# Patient Record
Sex: Male | Born: 1935 | Race: White | Hispanic: No | Smoking: Never smoker
Health system: Southern US, Community
[De-identification: ages and names within clinical notes are randomized; demographics above are authoritative.]

## PROBLEM LIST (undated history)

## (undated) DIAGNOSIS — F329 Major depressive disorder, single episode, unspecified: Secondary | ICD-10-CM

## (undated) DIAGNOSIS — G20 Parkinson's disease: Secondary | ICD-10-CM

## (undated) DIAGNOSIS — G2581 Restless legs syndrome: Secondary | ICD-10-CM

## (undated) DIAGNOSIS — I10 Essential (primary) hypertension: Secondary | ICD-10-CM

## (undated) DIAGNOSIS — F419 Anxiety disorder, unspecified: Secondary | ICD-10-CM

## (undated) DIAGNOSIS — E039 Hypothyroidism, unspecified: Secondary | ICD-10-CM

## (undated) DIAGNOSIS — G20A1 Parkinson's disease without dyskinesia, without mention of fluctuations: Secondary | ICD-10-CM

## (undated) DIAGNOSIS — N200 Calculus of kidney: Secondary | ICD-10-CM

## (undated) DIAGNOSIS — I2510 Atherosclerotic heart disease of native coronary artery without angina pectoris: Secondary | ICD-10-CM

## (undated) DIAGNOSIS — F32A Depression, unspecified: Secondary | ICD-10-CM

## (undated) HISTORY — PX: APPENDECTOMY: SHX54

## (undated) HISTORY — PX: CARDIAC CATHETERIZATION: SHX172

## (undated) HISTORY — PX: CORONARY ARTERY BYPASS GRAFT: SHX141

---

## 2007-06-20 ENCOUNTER — Other Ambulatory Visit: Payer: Self-pay

## 2007-06-20 ENCOUNTER — Inpatient Hospital Stay: Payer: Self-pay | Admitting: Internal Medicine

## 2007-06-22 ENCOUNTER — Other Ambulatory Visit: Payer: Self-pay

## 2007-07-10 ENCOUNTER — Inpatient Hospital Stay: Payer: Self-pay | Admitting: Specialist

## 2007-07-10 ENCOUNTER — Other Ambulatory Visit: Payer: Self-pay

## 2007-07-28 ENCOUNTER — Observation Stay: Payer: Self-pay | Admitting: Specialist

## 2007-07-28 ENCOUNTER — Other Ambulatory Visit: Payer: Self-pay

## 2008-06-23 ENCOUNTER — Ambulatory Visit: Payer: Self-pay | Admitting: Surgery

## 2011-03-07 ENCOUNTER — Emergency Department: Payer: Self-pay | Admitting: Emergency Medicine

## 2011-09-28 ENCOUNTER — Emergency Department: Payer: Self-pay | Admitting: Emergency Medicine

## 2011-09-28 LAB — APTT: Activated PTT: 28.3 secs (ref 23.6–35.9)

## 2011-09-28 LAB — CBC
HCT: 44.4 % (ref 40.0–52.0)
RBC: 4.64 10*6/uL (ref 4.40–5.90)
RDW: 11.9 % (ref 11.5–14.5)

## 2011-09-28 LAB — URINALYSIS, COMPLETE
Bilirubin,UR: NEGATIVE
Blood: NEGATIVE
Glucose,UR: NEGATIVE mg/dL (ref 0–75)
Leukocyte Esterase: NEGATIVE
Nitrite: NEGATIVE
Ph: 6 (ref 4.5–8.0)
Specific Gravity: 1.023 (ref 1.003–1.030)
WBC UR: 1 /HPF (ref 0–5)

## 2011-09-28 LAB — COMPREHENSIVE METABOLIC PANEL
Alkaline Phosphatase: 57 U/L (ref 50–136)
BUN: 15 mg/dL (ref 7–18)
Calcium, Total: 9.1 mg/dL (ref 8.5–10.1)
Co2: 26 mmol/L (ref 21–32)
Creatinine: 1.06 mg/dL (ref 0.60–1.30)
EGFR (Non-African Amer.): >60
Glucose: 84 mg/dL (ref 65–99)
Osmolality: 276 (ref 275–301)
Potassium: 4 mmol/L (ref 3.5–5.1)
SGOT(AST): 35 U/L (ref 15–37)
Sodium: 138 mmol/L (ref 136–145)
Total Protein: 7.9 g/dL (ref 6.4–8.2)

## 2011-09-28 LAB — TSH: Thyroid Stimulating Horm: 2.5 u[IU]/mL

## 2011-09-28 LAB — PROTIME-INR: INR: 1

## 2014-05-02 ENCOUNTER — Emergency Department: Payer: Self-pay | Admitting: Student

## 2014-10-18 ENCOUNTER — Emergency Department
Admit: 2014-10-18 | Disposition: A | Discharge status: Home / Self Care | Payer: Self-pay | Admitting: Emergency Medicine

## 2014-10-18 LAB — URINALYSIS, COMPLETE
BLOOD: NEGATIVE
Bacteria: NONE SEEN
Bilirubin,UR: NEGATIVE
Glucose,UR: NEGATIVE mg/dL (ref 0–75)
NITRITE: NEGATIVE
PH: 5 (ref 4.5–8.0)
Protein: 30
Specific Gravity: 1.032 (ref 1.003–1.030)
Squamous Epithelial: NONE SEEN

## 2014-10-18 LAB — CBC
HCT: 40.4 % (ref 40.0–52.0)
HGB: 14 g/dL (ref 13.0–18.0)
MCH: 32.6 pg (ref 26.0–34.0)
MCHC: 34.7 g/dL (ref 32.0–36.0)
MCV: 94 fL (ref 80–100)
PLATELETS: 140 10*3/uL — AB (ref 150–440)
RBC: 4.3 10*6/uL — ABNORMAL LOW (ref 4.40–5.90)
RDW: 13.1 % (ref 11.5–14.5)
WBC: 9 10*3/uL (ref 3.8–10.6)

## 2014-10-18 LAB — COMPREHENSIVE METABOLIC PANEL
ALK PHOS: 58 U/L
AST: 24 U/L
Albumin: 4.4 g/dL
Anion Gap: 8 (ref 7–16)
BUN: 46 mg/dL — AB
Bilirubin,Total: 0.9 mg/dL
CALCIUM: 9.1 mg/dL
CO2: 24 mmol/L
Chloride: 108 mmol/L
Creatinine: 1.62 mg/dL — ABNORMAL HIGH
EGFR (African American): 44 — ABNORMAL LOW
EGFR (Non-African Amer.): 38 — ABNORMAL LOW
GLUCOSE: 156 mg/dL — AB
Potassium: 3.8 mmol/L
SODIUM: 140 mmol/L
Total Protein: 6.9 g/dL

## 2014-10-18 LAB — TROPONIN I: Troponin-I: <0.03 ng/mL

## 2014-10-19 LAB — URINE CULTURE

## 2015-01-16 ENCOUNTER — Other Ambulatory Visit: Payer: Self-pay

## 2015-01-16 ENCOUNTER — Encounter: Payer: Self-pay | Admitting: Emergency Medicine

## 2015-01-16 ENCOUNTER — Emergency Department: Payer: Medicare Other

## 2015-01-16 ENCOUNTER — Emergency Department
Admission: EM | Admit: 2015-01-16 | Discharge: 2015-01-16 | Disposition: A | Discharge status: Home / Self Care | Payer: Medicare Other | Attending: Emergency Medicine | Admitting: Emergency Medicine

## 2015-01-16 DIAGNOSIS — I10 Essential (primary) hypertension: Secondary | ICD-10-CM | POA: Insufficient documentation

## 2015-01-16 DIAGNOSIS — Z87891 Personal history of nicotine dependence: Secondary | ICD-10-CM | POA: Insufficient documentation

## 2015-01-16 DIAGNOSIS — R109 Unspecified abdominal pain: Secondary | ICD-10-CM | POA: Diagnosis present

## 2015-01-16 HISTORY — DX: Calculus of kidney: N20.0

## 2015-01-16 HISTORY — DX: Major depressive disorder, single episode, unspecified: F32.9

## 2015-01-16 HISTORY — DX: Essential (primary) hypertension: I10

## 2015-01-16 HISTORY — DX: Depression, unspecified: F32.A

## 2015-01-16 HISTORY — DX: Anxiety disorder, unspecified: F41.9

## 2015-01-16 LAB — URINALYSIS COMPLETE WITH MICROSCOPIC (ARMC ONLY)
Bacteria, UA: NONE SEEN
Bilirubin Urine: NEGATIVE
GLUCOSE, UA: NEGATIVE mg/dL
Hgb urine dipstick: NEGATIVE
KETONES UR: NEGATIVE mg/dL
LEUKOCYTES UA: NEGATIVE
Nitrite: NEGATIVE
PROTEIN: NEGATIVE mg/dL
RBC / HPF: NONE SEEN RBC/hpf (ref 0–5)
Specific Gravity, Urine: 1.003 — ABNORMAL LOW (ref 1.005–1.030)
Squamous Epithelial / LPF: NONE SEEN
WBC, UA: NONE SEEN WBC/hpf (ref 0–5)
pH: 7 (ref 5.0–8.0)

## 2015-01-16 LAB — CBC WITH DIFFERENTIAL/PLATELET
BASOS ABS: 0.1 10*3/uL (ref 0–0.1)
Basophils Relative: 2 %
Eosinophils Absolute: 0.2 10*3/uL (ref 0–0.7)
Eosinophils Relative: 3 %
HEMATOCRIT: 36.5 % — AB (ref 40.0–52.0)
HEMOGLOBIN: 12.5 g/dL — AB (ref 13.0–18.0)
LYMPHS ABS: 0.9 10*3/uL — AB (ref 1.0–3.6)
Lymphocytes Relative: 15 %
MCH: 32 pg (ref 26.0–34.0)
MCHC: 34.2 g/dL (ref 32.0–36.0)
MCV: 93.7 fL (ref 80.0–100.0)
Monocytes Absolute: 0.2 10*3/uL (ref 0.2–1.0)
Monocytes Relative: 4 %
NEUTROS ABS: 4.4 10*3/uL (ref 1.4–6.5)
Neutrophils Relative %: 76 %
Platelets: 110 10*3/uL — ABNORMAL LOW (ref 150–440)
RBC: 3.89 MIL/uL — ABNORMAL LOW (ref 4.40–5.90)
RDW: 13.2 % (ref 11.5–14.5)
WBC: 5.8 10*3/uL (ref 3.8–10.6)

## 2015-01-16 LAB — BASIC METABOLIC PANEL
Anion gap: 6 (ref 5–15)
BUN: 24 mg/dL — AB (ref 6–20)
CO2: 28 mmol/L (ref 22–32)
CREATININE: 1.06 mg/dL (ref 0.61–1.24)
Calcium: 8.4 mg/dL — ABNORMAL LOW (ref 8.9–10.3)
Chloride: 104 mmol/L (ref 101–111)
GFR calc non Af Amer: >60 mL/min (ref >60)
Glucose, Bld: 114 mg/dL — ABNORMAL HIGH (ref 65–99)
POTASSIUM: 4.4 mmol/L (ref 3.5–5.1)
SODIUM: 138 mmol/L (ref 135–145)

## 2015-01-16 LAB — HEPATIC FUNCTION PANEL
ALT: 5 U/L — AB (ref 17–63)
AST: 15 U/L (ref 15–41)
Albumin: 3.8 g/dL (ref 3.5–5.0)
Alkaline Phosphatase: 44 U/L (ref 38–126)
BILIRUBIN DIRECT: 0.2 mg/dL (ref 0.1–0.5)
BILIRUBIN TOTAL: 0.9 mg/dL (ref 0.3–1.2)
Indirect Bilirubin: 0.7 mg/dL (ref 0.3–0.9)
Total Protein: 5.8 g/dL — ABNORMAL LOW (ref 6.5–8.1)

## 2015-01-16 LAB — LIPASE, BLOOD: LIPASE: 22 U/L (ref 22–51)

## 2015-01-16 MED ORDER — SODIUM CHLORIDE 0.9 % IV BOLUS (SEPSIS)
500.0000 mL | INTRAVENOUS | Status: AC
  Administered 2015-01-16: 500 mL via INTRAVENOUS

## 2015-01-16 MED ORDER — IOHEXOL 240 MG/ML SOLN
25.0000 mL | Freq: Once | INTRAMUSCULAR | Status: AC | PRN
  Administered 2015-01-16: 25 mL via ORAL

## 2015-01-16 MED ORDER — IOHEXOL 300 MG/ML  SOLN
100.0000 mL | Freq: Once | INTRAMUSCULAR | Status: AC | PRN
  Administered 2015-01-16: 100 mL via INTRAVENOUS

## 2015-01-16 NOTE — Discharge Instructions (Signed)
You have been seen in the Emergency Department (ED) for flank pain.  Your evaluation did not identify a clear cause of your symptoms but was generally reassuring; there is no evidence that you have a urinary tract infection and your CT scan was normal.  You may have some muscle strain which would explain why you notice the pain more when your straining to get up out of bed.  Please follow up as instructed above regarding todays emergent visit and the symptoms that are bothering you.  Return to the ED if your abdominal pain worsens or fails to improve, you develop bloody vomiting, bloody diarrhea, you are unable to tolerate fluids due to vomiting, fever greater than 101, or other symptoms that concern you.   Flank Pain Flank pain refers to pain that is located on the side of the body between the upper abdomen and the back. The pain may occur over a short period of time (acute) or may be long-term or reoccurring (chronic). It may be mild or severe. Flank pain can be caused by many things. CAUSES  Some of the more common causes of flank pain include:  Muscle strains.   Muscle spasms.   A disease of your spine (vertebral disk disease).   A lung infection (pneumonia).   Fluid around your lungs (pulmonary edema).   A kidney infection.   Kidney stones.   A very painful skin rash caused by the chickenpox virus (shingles).   Gallbladder disease.  HOME CARE INSTRUCTIONS  Home care will depend on the cause of your pain. In general,  Rest as directed by your caregiver.  Drink enough fluids to keep your urine clear or pale yellow.  Only take over-the-counter or prescription medicines as directed by your caregiver. Some medicines may help relieve the pain.  Tell your caregiver about any changes in your pain.  Follow up with your caregiver as directed. SEEK IMMEDIATE MEDICAL CARE IF:   Your pain is not controlled with medicine.   You have new or worsening symptoms.  Your  pain increases.   You have abdominal pain.   You have shortness of breath.   You have persistent nausea or vomiting.   You have swelling in your abdomen.   You feel faint or pass out.   You have blood in your urine.  You have a fever or persistent symptoms for more than 2-3 days.  You have a fever and your symptoms suddenly get worse. MAKE SURE YOU:   Understand these instructions.  Will watch your condition.  Will get help right away if you are not doing well or get worse. Document Released: 08/04/2005 Document Revised: 03/07/2012 Document Reviewed: 01/26/2012 [facility] Patient Information 2015 [place], [place]. This information is not intended to replace advice given to you by your health care provider. Make sure you discuss any questions you have with your health care provider.

## 2015-01-16 NOTE — ED Notes (Signed)
Patient denies pain and is resting comfortably.  

## 2015-01-16 NOTE — ED Notes (Signed)
Patient presents to the ED via EMS from [facility].  Patient is alert and oriented x 4.  Patient was visiting with his daughter and was complaining of left lower quadrant pain that radiates into left flank when he moves.  Patient denies pain at this time.  Patient states his urine has been darker than normal for the past week.

## 2015-01-16 NOTE — ED Provider Notes (Addendum)
-----------------------------------------   3:30 PM on 01/16/2015 -----------------------------------------   Blood pressure 181/82, pulse 63, temperature 98 F (36.7 C), temperature source Oral, resp. rate 18, height 5' 8.5" (1.74 m), weight 160 lb 0.9 oz (72.6 kg), SpO2 100 %.  Assuming care from Dr. Derrill Kay.  In short, Anthony Blankenship is a 81 y.o. male with a chief complaint of Abdominal Pain .  Refer to the original H&P for additional details.  The current plan of care is to follow up on his CT abd/pelvis and reassess.  ----------------------------------------- 6:07 PM on 01/16/2015 -----------------------------------------   CT abd/pelvis unremarkable.  I reassessed the patient and he states he has been having no pain since he has been here.  He said that the pain is notable only when he tries to get up out of bed.  We discussed the possibility this is musculoskeletal.  I considered the possibility of a renal infarction as a cause of his left flank pain, but this does not seem consistent given that the pain is completely resolved and is reproducible with movement, as opposed to persistent, consistent pain.  He is eager to go home and asked if I can hurry it up.  I was initially concerned because I was seeing the patient for the first time in his right eye seemed to have some ptosis.  However, I asked him about it and he said that he has been having eye problems for about a week and it is nothing new or different.  2 nurses that saw him earlier today said he is unchanged from when he arrived, and he remains strong and all of his extremities and has no changes in sensation or facial nerves.  I do not believe he has had an acute CVA while in the emergency department and he remains eager to go home.  Ct Abdomen Pelvis W Contrast  01/16/2015   CLINICAL DATA:  Left lower quadrant abdominal pain radiating to the left flank  EXAM: CT ABDOMEN AND PELVIS WITH CONTRAST  TECHNIQUE: Multidetector CT  imaging of the abdomen and pelvis was performed using the standard protocol following bolus administration of intravenous contrast.  CONTRAST:  OMNIPAQUE IOHEXOL 300 MG/ML  SOLN  COMPARISON:  None.  FINDINGS: Lower chest: Minimal curvilinear dependent atelectasis is present versus scarring. 2 mm pleural-parenchymal right middle lobe nodule image 4 series 5 is likely of no clinical consequence. No pleural effusion.  Hepatobiliary: Calcification at the dome of the right hepatic lobe image 15 could be a granuloma or pleural calcification. No focal hepatic abnormality. Gallbladder is unremarkable.  Pancreas: Normal  Spleen: Normal  Adrenals/Urinary Tract: Adrenal glands appear normal. Kidneys appear normal. No radiopaque renal or ureteral calculus. The bladder is normal.  Stomach/Bowel: Stomach and bowel appear normal. Moderate stool burden.  Vascular/Lymphatic: Mild atheromatous aortic calcification without aneurysm. No lymphadenopathy.  Reproductive: Prostate appears normal.  Musculoskeletal: Lumbar spine disc degenerative change. No acute osseous abnormality.  Other: No free air or fluid.  IMPRESSION: No acute intra-abdominal or pelvic pathology.   Electronically Signed   By: Christiana Pellant M.D.   On: 01/16/2015 17:50     Loleta Rose, MD 01/16/15 1901

## 2015-01-16 NOTE — ED Provider Notes (Signed)
[facility] Emergency Department Provider Note  ____________________________________________  Time seen: 1410  I have reviewed the triage vital signs and the nursing notes.   HISTORY  Chief Complaint No chief complaint on file.   History limited by: Not Limited   HPI Anthony Blankenship is a 81 y.o. male who presents to the emergency department today with left flank pain. He states that the pain is been going on for 3 days. He states it is present when the patient gets up and walks around however most the time he does not notice it. He describes the pain as being located in the left flank. He denies any change with defecation. He states his urine has appeared slightly darker than normal. States he has a history of a kidney stone many years ago and this pain reminds him slightly of that. Denies any recent trauma. Denies any fevers.  No past medical history on file.  There are no active problems to display for this patient.   No past surgical history on file.  No current outpatient prescriptions on file.  Allergies Review of patient's allergies indicates not on file.  No family history on file.  Social History History  Substance Use Topics  . Smoking status: Former Games developer  . Smokeless tobacco: Not on file  . Alcohol Use: No    Review of Systems  Constitutional: Negative for fever. Cardiovascular: Negative for chest pain. Respiratory: Negative for shortness of breath. Gastrointestinal: Positive for left flank pain Genitourinary: Negative for dysuria. Musculoskeletal: Negative for back pain. Skin: Negative for rash. Neurological: Negative for headaches, focal weakness or numbness.  10-point ROS otherwise negative.  ____________________________________________   PHYSICAL EXAM:  VITAL SIGNS:  98 F (36.7 C)   50  16  105/89 mmHg  96 %      Constitutional: Alert and oriented. Well appearing and in no distress. Eyes: Conjunctivae are  normal. PERRL. Normal extraocular movements. ENT   Head: Normocephalic and atraumatic.   Nose: No congestion/rhinnorhea.   Mouth/Throat: Mucous membranes are moist.   Neck: No stridor. Hematological/Lymphatic/Immunilogical: No cervical lymphadenopathy. Cardiovascular: Normal rate, regular rhythm.  No murmurs, rubs, or gallops. Respiratory: Normal respiratory effort without tachypnea nor retractions. Breath sounds are clear and equal bilaterally. No wheezes/rales/rhonchi. Gastrointestinal: Soft and nontender. No distention.  Genitourinary: Deferred Musculoskeletal: Normal range of motion in all extremities. No joint effusions.  No lower extremity tenderness nor edema. Neurologic:  Normal speech and language. No gross focal neurologic deficits are appreciated. Speech is normal.  Skin:  Skin is warm, dry and intact. No rash noted. Psychiatric: Mood and affect are normal. Speech and behavior are normal. Patient exhibits appropriate insight and judgment.  ____________________________________________    LABS (pertinent positives/negatives)  Pending at the time of signout  ____________________________________________   EKG  I, Phineas Semen, attending physician, personally viewed and interpreted this EKG  EKG Time: 1515 Rate: 46 Rhythm: sinus bradycardia Axis: normal Intervals: qtc 397 QRS: narrow ST changes: no st elevation    ____________________________________________    RADIOLOGY  CT abd/pel pending  ____________________________________________   PROCEDURES  Procedure(s) performed: None  Critical Care performed: No  ____________________________________________   INITIAL IMPRESSION / ASSESSMENT AND PLAN / ED COURSE  Pertinent labs & imaging results that were available during my care of the patient were reviewed by me and considered in my medical decision making (see chart for details).  Patient presents to the emergency department today with  3 days of left flank abdominal pain. On  exam abdomen is benign without any tenderness, rigidity. However given patient's age and concern for entry abdominal pathology will proceed with lab work and CT abdomen and pelvis.  ____________________________________________   FINAL CLINICAL IMPRESSION(S) / ED DIAGNOSES  Abdominal pain  Phineas Semen, MD 01/16/15 1524

## 2015-01-16 NOTE — ED Notes (Signed)
Bedside report received from Tobi Bastos, [place].

## 2015-02-12 ENCOUNTER — Emergency Department
Admission: EM | Admit: 2015-02-12 | Discharge: 2015-02-12 | Disposition: A | Discharge status: Home / Self Care | Payer: Medicare Other | Attending: Emergency Medicine | Admitting: Emergency Medicine

## 2015-02-12 ENCOUNTER — Other Ambulatory Visit: Payer: Self-pay

## 2015-02-12 ENCOUNTER — Encounter: Payer: Self-pay | Admitting: Emergency Medicine

## 2015-02-12 DIAGNOSIS — R350 Frequency of micturition: Secondary | ICD-10-CM | POA: Diagnosis present

## 2015-02-12 DIAGNOSIS — G8929 Other chronic pain: Secondary | ICD-10-CM | POA: Diagnosis not present

## 2015-02-12 DIAGNOSIS — M549 Dorsalgia, unspecified: Secondary | ICD-10-CM | POA: Diagnosis not present

## 2015-02-12 DIAGNOSIS — Z87891 Personal history of nicotine dependence: Secondary | ICD-10-CM | POA: Insufficient documentation

## 2015-02-12 DIAGNOSIS — I10 Essential (primary) hypertension: Secondary | ICD-10-CM | POA: Diagnosis not present

## 2015-02-12 HISTORY — DX: Parkinson's disease: G20

## 2015-02-12 HISTORY — DX: Parkinson's disease without dyskinesia, without mention of fluctuations: G20.A1

## 2015-02-12 HISTORY — DX: Atherosclerotic heart disease of native coronary artery without angina pectoris: I25.10

## 2015-02-12 LAB — CBC
HCT: 39.9 % — ABNORMAL LOW (ref 40.0–52.0)
Hemoglobin: 13.3 g/dL (ref 13.0–18.0)
MCH: 31.5 pg (ref 26.0–34.0)
MCHC: 33.2 g/dL (ref 32.0–36.0)
MCV: 94.7 fL (ref 80.0–100.0)
PLATELETS: 116 10*3/uL — AB (ref 150–440)
RBC: 4.22 MIL/uL — AB (ref 4.40–5.90)
RDW: 13.6 % (ref 11.5–14.5)
WBC: 5.5 10*3/uL (ref 3.8–10.6)

## 2015-02-12 LAB — URINALYSIS COMPLETE WITH MICROSCOPIC (ARMC ONLY)
BACTERIA UA: NONE SEEN
Bilirubin Urine: NEGATIVE
Glucose, UA: NEGATIVE mg/dL
Hgb urine dipstick: NEGATIVE
Leukocytes, UA: NEGATIVE
NITRITE: NEGATIVE
PH: 7 (ref 5.0–8.0)
PROTEIN: NEGATIVE mg/dL
SPECIFIC GRAVITY, URINE: 1.012 (ref 1.005–1.030)

## 2015-02-12 LAB — COMPREHENSIVE METABOLIC PANEL
ALT: 5 U/L — AB (ref 17–63)
AST: 22 U/L (ref 15–41)
Albumin: 4.1 g/dL (ref 3.5–5.0)
Alkaline Phosphatase: 45 U/L (ref 38–126)
Anion gap: 5 (ref 5–15)
BUN: 24 mg/dL — AB (ref 6–20)
CHLORIDE: 104 mmol/L (ref 101–111)
CO2: 29 mmol/L (ref 22–32)
Calcium: 9.1 mg/dL (ref 8.9–10.3)
Creatinine, Ser: 1.06 mg/dL (ref 0.61–1.24)
GFR calc Af Amer: >60 mL/min (ref >60)
Glucose, Bld: 119 mg/dL — ABNORMAL HIGH (ref 65–99)
POTASSIUM: 4.8 mmol/L (ref 3.5–5.1)
SODIUM: 138 mmol/L (ref 135–145)
Total Bilirubin: 0.5 mg/dL (ref 0.3–1.2)
Total Protein: 6.5 g/dL (ref 6.5–8.1)

## 2015-02-12 NOTE — ED Notes (Signed)
MD at bedside. Dr.Kinner 

## 2015-02-12 NOTE — Discharge Instructions (Signed)

## 2015-02-12 NOTE — ED Notes (Signed)
Ems pt from [facility] with urinary frequency and reported chronic back pain , pt alert x2 , calm cooperative , no distress noted

## 2015-02-12 NOTE — ED Notes (Signed)
Spoke with the [facility] in regards to transportation for the patient being discharged.  [facility] will come transport the patient with an ETA of 13:00.  Verbalized understanding of this information.

## 2015-02-12 NOTE — ED Provider Notes (Signed)
[facility] Emergency Department Provider Note  ____________________________________________  Time seen: On arrival, via EMS  I have reviewed the triage vital signs and the nursing notes.   HISTORY  Chief Complaint Urinary Frequency    HPI Anthony Blankenship is a 81 y.o. male who was sent by staff at the [facility] because he is having urinary frequency he also has chronic back pain. He denies fevers chills. No nausea vomiting. No abdominal pain. He reports his back pain is same as it always is. He is annoyed that he was sent to the emergency department. He denies dysuria, no rash or discharge     Past Medical History  Diagnosis Date  . Kidney stones   . Hypertension   . Anxiety   . Depression   . Parkinson's disease   . Coronary artery disease     There are no active problems to display for this patient.   Past Surgical History  Procedure Laterality Date  . Appendectomy    . Coronary artery bypass graft      No current outpatient prescriptions on file.  Allergies Review of patient's allergies indicates no known allergies.  No family history on file.  Social History Social History  Substance Use Topics  . Smoking status: Former Games developer  . Smokeless tobacco: None  . Alcohol Use: No    Review of Systems  Constitutional: Negative for fever. Eyes: Negative for visual changes. ENT: Negative for sore throat Cardiovascular: Negative for chest pain. Respiratory: Negative for shortness of breath. Gastrointestinal: Negative for abdominal pain, vomiting and diarrhea. Genitourinary: Negative for dysuria. Positive for frequency Musculoskeletal: Negative for back pain. Skin: Negative for rash. Neurological: Negative for headaches or focal weakness Psychiatric: No anxiety    ____________________________________________   PHYSICAL EXAM:  VITAL SIGNS: ED Triage Vitals  Enc Vitals Group     BP --      Pulse Rate 02/12/15 0927 57     Resp  02/12/15 0927 18     Temp 02/12/15 0927 98.4 F (36.9 C)     Temp Source 02/12/15 0927 Oral     SpO2 02/12/15 0927 100 %     Weight 02/12/15 0927 180 lb (81.647 kg)     Height 02/12/15 0927 5\' 9"  (1.753 m)     Head Cir --      Peak Flow --      Pain Score 02/12/15 0928 0     Pain Loc --      Pain Edu? --      Excl. in GC? --      Constitutional: Alert and oriented. Well appearing and in no distress. Pleasant and interactive Eyes: Conjunctivae are normal.  ENT   Head: Normocephalic and atraumatic.   Mouth/Throat: Mucous membranes are moist. Cardiovascular: Normal rate, regular rhythm. Normal and symmetric distal pulses are present in all extremities. No murmurs, rubs, or gallops. Respiratory: Normal respiratory effort without tachypnea nor retractions. Breath sounds are clear and equal bilaterally.  Gastrointestinal: Soft and non-tender in all quadrants. No distention. There is no CVA tenderness. Genitourinary: deferred Musculoskeletal: Nontender with normal range of motion in all extremities. No lower extremity tenderness nor edema. Neurologic:  Normal speech and language. No gross focal neurologic deficits are appreciated. Skin:  Skin is warm, dry and intact. No rash noted. Psychiatric: Mood and affect are normal. Patient exhibits appropriate insight and judgment.  ____________________________________________    LABS (pertinent positives/negatives)  Labs Reviewed - No data to display  ____________________________________________  EKG  ED ECG REPORT I, Jene Every, the attending physician, personally viewed and interpreted this ECG.   Date: 02/12/2015  EKG Time: 9:35 AM  Rate: 61  Rhythm: normal sinus rhythm  Axis: Normal axis  Intervals:Incomplete right bundle-branch block  ST&T Change: Non-specific   ____________________________________________    RADIOLOGY I have personally reviewed any xrays that were ordered on this  patient: None  ____________________________________________   PROCEDURES  Procedure(s) performed: none  Critical Care performed: none  ____________________________________________   INITIAL IMPRESSION / ASSESSMENT AND PLAN / ED COURSE  Pertinent labs & imaging results that were available during my care of the patient were reviewed by me and considered in my medical decision making (see chart for details).  Patient is well-appearing and is in no acute distress. His vitals are unremarkable. We will check a urinalysis and basic blood work and reevaluate  Urinalysis is normal, patient feels well and his blood pressure has been stable after one abnormal bp which I think was erroneous. We will discharge the patient, with PCP follow up  ____________________________________________   FINAL CLINICAL IMPRESSION(S) / ED DIAGNOSES  Final diagnoses:  Chronic back pain     Jene Every, MD 02/12/15 [phone]

## 2015-05-14 ENCOUNTER — Emergency Department: Payer: Medicare Other

## 2015-05-14 ENCOUNTER — Observation Stay
Admission: EM | Admit: 2015-05-14 | Discharge: 2015-05-15 | Disposition: A | Discharge status: Skilled Nursing Facility | Payer: Medicare Other | Attending: Internal Medicine | Admitting: Internal Medicine

## 2015-05-14 ENCOUNTER — Encounter: Payer: Self-pay | Admitting: Emergency Medicine

## 2015-05-14 DIAGNOSIS — G20 Parkinson's disease: Secondary | ICD-10-CM | POA: Insufficient documentation

## 2015-05-14 DIAGNOSIS — I10 Essential (primary) hypertension: Secondary | ICD-10-CM | POA: Insufficient documentation

## 2015-05-14 DIAGNOSIS — F329 Major depressive disorder, single episode, unspecified: Secondary | ICD-10-CM | POA: Diagnosis not present

## 2015-05-14 DIAGNOSIS — I2510 Atherosclerotic heart disease of native coronary artery without angina pectoris: Secondary | ICD-10-CM | POA: Diagnosis not present

## 2015-05-14 DIAGNOSIS — F0390 Unspecified dementia without behavioral disturbance: Secondary | ICD-10-CM | POA: Diagnosis not present

## 2015-05-14 DIAGNOSIS — R29810 Facial weakness: Secondary | ICD-10-CM | POA: Insufficient documentation

## 2015-05-14 DIAGNOSIS — G2581 Restless legs syndrome: Secondary | ICD-10-CM | POA: Diagnosis not present

## 2015-05-14 DIAGNOSIS — W06XXXA Fall from bed, initial encounter: Secondary | ICD-10-CM | POA: Insufficient documentation

## 2015-05-14 DIAGNOSIS — Z8379 Family history of other diseases of the digestive system: Secondary | ICD-10-CM | POA: Diagnosis not present

## 2015-05-14 DIAGNOSIS — Z79899 Other long term (current) drug therapy: Secondary | ICD-10-CM | POA: Diagnosis not present

## 2015-05-14 DIAGNOSIS — R451 Restlessness and agitation: Secondary | ICD-10-CM

## 2015-05-14 DIAGNOSIS — Z87891 Personal history of nicotine dependence: Secondary | ICD-10-CM | POA: Insufficient documentation

## 2015-05-14 DIAGNOSIS — I4510 Unspecified right bundle-branch block: Secondary | ICD-10-CM | POA: Insufficient documentation

## 2015-05-14 DIAGNOSIS — G20A1 Parkinson's disease without dyskinesia, without mention of fluctuations: Secondary | ICD-10-CM

## 2015-05-14 DIAGNOSIS — Z87442 Personal history of urinary calculi: Secondary | ICD-10-CM | POA: Insufficient documentation

## 2015-05-14 DIAGNOSIS — Z951 Presence of aortocoronary bypass graft: Secondary | ICD-10-CM | POA: Diagnosis not present

## 2015-05-14 DIAGNOSIS — W19XXXA Unspecified fall, initial encounter: Secondary | ICD-10-CM

## 2015-05-14 DIAGNOSIS — R531 Weakness: Secondary | ICD-10-CM | POA: Diagnosis present

## 2015-05-14 DIAGNOSIS — F419 Anxiety disorder, unspecified: Secondary | ICD-10-CM | POA: Diagnosis not present

## 2015-05-14 DIAGNOSIS — E039 Hypothyroidism, unspecified: Secondary | ICD-10-CM | POA: Diagnosis not present

## 2015-05-14 HISTORY — DX: Restless legs syndrome: G25.81

## 2015-05-14 LAB — CBC WITH DIFFERENTIAL/PLATELET
Basophils Absolute: 0 10*3/uL (ref 0–0.1)
Basophils Relative: 0 %
Eosinophils Absolute: 0.1 10*3/uL (ref 0–0.7)
Eosinophils Relative: 1 %
HCT: 40.7 % (ref 40.0–52.0)
HEMOGLOBIN: 13.7 g/dL (ref 13.0–18.0)
LYMPHS PCT: 24 %
Lymphs Abs: 1.6 10*3/uL (ref 1.0–3.6)
MCH: 32.1 pg (ref 26.0–34.0)
MCHC: 33.7 g/dL (ref 32.0–36.0)
MCV: 95.1 fL (ref 80.0–100.0)
Monocytes Absolute: 0.4 10*3/uL (ref 0.2–1.0)
Monocytes Relative: 6 %
NEUTROS ABS: 4.5 10*3/uL (ref 1.4–6.5)
NEUTROS PCT: 69 %
Platelets: 138 10*3/uL — ABNORMAL LOW (ref 150–440)
RBC: 4.28 MIL/uL — AB (ref 4.40–5.90)
RDW: 12.4 % (ref 11.5–14.5)
WBC: 6.5 10*3/uL (ref 3.8–10.6)

## 2015-05-14 LAB — COMPREHENSIVE METABOLIC PANEL
ALT: 16 U/L — ABNORMAL LOW (ref 17–63)
AST: 22 U/L (ref 15–41)
Albumin: 4.3 g/dL (ref 3.5–5.0)
Alkaline Phosphatase: 48 U/L (ref 38–126)
Anion gap: 6 (ref 5–15)
BUN: 24 mg/dL — AB (ref 6–20)
CHLORIDE: 104 mmol/L (ref 101–111)
CO2: 30 mmol/L (ref 22–32)
Calcium: 9.1 mg/dL (ref 8.9–10.3)
Creatinine, Ser: 1.17 mg/dL (ref 0.61–1.24)
GFR, EST NON AFRICAN AMERICAN: 54 mL/min — AB (ref >60)
Glucose, Bld: 137 mg/dL — ABNORMAL HIGH (ref 65–99)
POTASSIUM: 4.2 mmol/L (ref 3.5–5.1)
Sodium: 140 mmol/L (ref 135–145)
Total Bilirubin: 0.7 mg/dL (ref 0.3–1.2)
Total Protein: 6.6 g/dL (ref 6.5–8.1)

## 2015-05-14 LAB — URINALYSIS COMPLETE WITH MICROSCOPIC (ARMC ONLY)
Bacteria, UA: NONE SEEN
Bilirubin Urine: NEGATIVE
Glucose, UA: NEGATIVE mg/dL
Hgb urine dipstick: NEGATIVE
Ketones, ur: NEGATIVE mg/dL
Leukocytes, UA: NEGATIVE
Nitrite: NEGATIVE
PH: 6 (ref 5.0–8.0)
PROTEIN: NEGATIVE mg/dL
SQUAMOUS EPITHELIAL / LPF: NONE SEEN
Specific Gravity, Urine: 1.02 (ref 1.005–1.030)

## 2015-05-14 LAB — TSH: TSH: 4.97 u[IU]/mL — AB (ref 0.350–4.500)

## 2015-05-14 LAB — TROPONIN I

## 2015-05-14 MED ORDER — LORAZEPAM 2 MG/ML IJ SOLN
0.5000 mg | Freq: Once | INTRAMUSCULAR | Status: AC
  Administered 2015-05-14: 0.5 mg via INTRAVENOUS
  Filled 2015-05-14: qty 1

## 2015-05-14 MED ORDER — CARBIDOPA-LEVODOPA 25-100 MG PO TABS
2.0000 | ORAL_TABLET | Freq: Four times a day (QID) | ORAL | Status: DC
  Administered 2015-05-15: 2 via ORAL
  Filled 2015-05-14: qty 2

## 2015-05-14 MED ORDER — HEPARIN SODIUM (PORCINE) 5000 UNIT/ML IJ SOLN
5000.0000 [IU] | Freq: Three times a day (TID) | INTRAMUSCULAR | Status: DC
  Administered 2015-05-14 – 2015-05-15 (×2): 5000 [IU] via SUBCUTANEOUS
  Filled 2015-05-14 (×2): qty 1

## 2015-05-14 MED ORDER — ENTACAPONE 200 MG PO TABS
200.0000 mg | ORAL_TABLET | Freq: Four times a day (QID) | ORAL | Status: DC
  Administered 2015-05-15: 200 mg via ORAL
  Filled 2015-05-14 (×5): qty 1

## 2015-05-14 MED ORDER — CARBIDOPA-LEVODOPA 25-100 MG PO TABS
1.0000 | ORAL_TABLET | Freq: Three times a day (TID) | ORAL | Status: DC
  Administered 2015-05-14 – 2015-05-15 (×2): 1 via ORAL
  Filled 2015-05-14 (×4): qty 1

## 2015-05-14 MED ORDER — LEVOTHYROXINE SODIUM 50 MCG PO TABS
50.0000 ug | ORAL_TABLET | Freq: Every day | ORAL | Status: DC
  Administered 2015-05-15: 50 ug via ORAL
  Filled 2015-05-14: qty 1

## 2015-05-14 MED ORDER — CARBIDOPA-LEVODOPA-ENTACAPONE 50-200-200 MG PO TABS
1.0000 | ORAL_TABLET | Freq: Four times a day (QID) | ORAL | Status: DC
  Filled 2015-05-14: qty 1

## 2015-05-14 MED ORDER — LORAZEPAM 2 MG/ML IJ SOLN: 0.5000 mg | Freq: Four times a day (QID) | INTRAMUSCULAR | Status: DC | PRN

## 2015-05-14 NOTE — ED Notes (Signed)
Pt from the [facility] after falling beside his bed. Hx parkinson's. C/o pain to right hand and right knee. Some right sided facial droop.

## 2015-05-14 NOTE — H&P (Signed)
[facility] - [place] at [facility]   PATIENT NAME: Anthony Blankenship    MR#:  [id number]  DATE OF BIRTH:  Apr 02, 1925  DATE OF ADMISSION:  05/14/2015  PRIMARY CARE PHYSICIAN: Pcp Not In System   REQUESTING/REFERRING PHYSICIAN: Dr. Carollee Massed  CHIEF COMPLAINT:   Chief Complaint  Patient presents with  . Fall   Source of information-patient's daughterNarda [phone].  HISTORY OF PRESENT ILLNESS: Anthony Blankenship  is a 81 y.o. male with a known history of hypertension, Parkinson's disease, coronary artery disease, restless leg syndrome- lives in an assisted living facility- but having worsening in his restlessness for last few days, history obtained from his daughter and she says that for last several nights he could not sleep because of severe restless leg syndrome. He is not able to eat enough because he is excessive shaking on his arms, and so he is getting weak and he fell down yesterday. Being an assisted living facility they don't have enough care, and concerned with this daughter brought him to emergency room after a fall. His workup in ER noted to be negative for any major lab abnormalities or infections, but as he has severe dementia and cannot get up because of excessive shaking and restlessness ER physician requested to admit him to hospital.  PAST MEDICAL HISTORY:   Past Medical History  Diagnosis Date  . Kidney stones   . Hypertension   . Anxiety   . Parkinson's disease (HCC)   . Coronary artery disease   . Restless leg syndrome     PAST SURGICAL HISTORY:  Past Surgical History  Procedure Laterality Date  . Appendectomy    . Coronary artery bypass graft      SOCIAL HISTORY:  Social History  Substance Use Topics  . Smoking status: Never Smoker   . Smokeless tobacco: Never Used  . Alcohol Use: No    FAMILY HISTORY:  Family History  Problem Relation Age of Onset  . Liver disease Father     DRUG ALLERGIES: No Known  Allergies  REVIEW OF SYSTEMS:   Not able to get because of dementia  MEDICATIONS AT HOME:  Prior to Admission medications   Medication Sig Start Date End Date Taking? Authorizing Provider  Carbidopa-Levodopa-Entacapone (STALEVO 200 PO) Take 1 tablet by mouth 4 (four) times daily.   Yes Historical Provider, MD  levothyroxine (SYNTHROID, LEVOTHROID) 50 MCG tablet Take 50 mcg by mouth daily before breakfast.   Yes Historical Provider, MD      PHYSICAL EXAMINATION:   VITAL SIGNS: Blood pressure 123/72, pulse 68, temperature 97.7 F (36.5 C), temperature source Oral, resp. rate 20, height  (1.753 m), weight 69.4 kg (153 lb), SpO2 98 %.  GENERAL:  81 y.o.-year-old patient lying in the bed with mild acute distress. Have generalized shaking movements on arms and legs.  EYES: Pupils equal, round, reactive to light and accommodation. No scleral icterus. Extraocular muscles intact.  HEENT: Head atraumatic, normocephalic. Oropharynx and nasopharynx clear.  NECK:  Supple, no jugular venous distention. No thyroid enlargement, no tenderness.  LUNGS: Normal breath sounds bilaterally, no wheezing, rales,rhonchi or crepitation. No use of accessory muscles of respiration.  CARDIOVASCULAR: S1, S2 normal. No murmurs, rubs, or gallops.  ABDOMEN: Soft, nontender, nondistended. Bowel sounds present. No organomegaly or mass.  EXTREMITIES: No pedal edema, cyanosis, or clubbing.  NEUROLOGIC: Cranial nerves II through XII are intact. Muscle strength 4/5 in all extremities. Sensation intact. Gait not checked. Coarse shaking on both arms  and legs, was not able to hold a cup of milk during my examination. PSYCHIATRIC: The patient is alert and demented.  SKIN: No obvious rash, lesion, or ulcer.   LABORATORY PANEL:   CBC  Recent Labs Lab 05/14/15 1227  WBC 6.5  HGB 13.7  HCT 40.7  PLT 138*  MCV 95.1  MCH 32.1  MCHC 33.7  RDW 12.4  LYMPHSABS 1.6  MONOABS 0.4  EOSABS 0.1  BASOSABS 0.0    ------------------------------------------------------------------------------------------------------------------  Chemistries   Recent Labs Lab 05/14/15 1227  NA 140  K 4.2  CL 104  CO2 30  GLUCOSE 137*  BUN 24*  CREATININE 1.17  CALCIUM 9.1  AST 22  ALT 16*  ALKPHOS 48  BILITOT 0.7   ------------------------------------------------------------------------------------------------------------------ estimated creatinine clearance is 42.8 mL/min (by C-G formula based on Cr of 1.17). ------------------------------------------------------------------------------------------------------------------ No results for input(s): TSH, T4TOTAL, T3FREE, THYROIDAB in the last 72 hours.  Invalid input(s): FREET3   Coagulation profile No results for input(s): INR, PROTIME in the last 168 hours. ------------------------------------------------------------------------------------------------------------------- No results for input(s): DDIMER in the last 72 hours. -------------------------------------------------------------------------------------------------------------------  Cardiac Enzymes  Recent Labs Lab 05/14/15 1227  TROPONINI <0.03   ------------------------------------------------------------------------------------------------------------------ Invalid input(s): POCBNP  ---------------------------------------------------------------------------------------------------------------  Urinalysis    Component Value Date/Time   COLORURINE YELLOW* 05/14/2015 1227   COLORURINE Amber 10/18/2014 0603   APPEARANCEUR CLEAR* 05/14/2015 1227   APPEARANCEUR Clear 10/18/2014 0603   LABSPEC 1.020 05/14/2015 1227   LABSPEC 1.032 10/18/2014 0603   PHURINE 6.0 05/14/2015 1227   PHURINE 5.0 10/18/2014 0603   GLUCOSEU NEGATIVE 05/14/2015 1227   GLUCOSEU Negative 10/18/2014 0603   HGBUR NEGATIVE 05/14/2015 1227   HGBUR Negative 10/18/2014 0603   BILIRUBINUR NEGATIVE 05/14/2015 1227    BILIRUBINUR Negative 10/18/2014 0603   KETONESUR NEGATIVE 05/14/2015 1227   KETONESUR Trace 10/18/2014 0603   PROTEINUR NEGATIVE 05/14/2015 1227   PROTEINUR 30 mg/dL 47/82/956204/23/2016 13080603   NITRITE NEGATIVE 05/14/2015 1227   NITRITE Negative 10/18/2014 0603   LEUKOCYTESUR NEGATIVE 05/14/2015 1227   LEUKOCYTESUR Trace 10/18/2014 0603     RADIOLOGY: Ct Head Wo Contrast  05/14/2015  CLINICAL DATA:  Fall from bed. Personal history Parkinson's disease. Right-sided facial droop. Weakness. EXAM: CT HEAD WITHOUT CONTRAST TECHNIQUE: Contiguous axial images were obtained from the base of the skull through the vertex without intravenous contrast. COMPARISON:  CT head without contrast 10/18/2014. FINDINGS: The basal ganglia are intact. A remote punctate left insular ribbon infarct is noted. No other acute cortical infarcts are present. No significant white matter disease is present. The ventricles are of normal size. No significant extra-axial fluid collection is present. The paranasal sinuses and mastoid air cells are clear. Calvarium is intact. No significant extracranial soft tissue lesion is present. IMPRESSION: 1. 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 2. Stable atrophy and white matter disease. 3. No acute intracranial abnormality. Electronically Signed   By: Marin Robertshristopher  Mattern M.D.   On: 05/14/2015 14:34      IMPRESSION AND PLAN:  * Excessive shaking and restless leg  Currently we'll continue carbidopa levodopa,  And give him Ativan injection for restlessness.  Neurologic consult to help adjust his medications.  Meanwhile he will need full assistance by the nurse for feeding as he cannot hold his cup.  * Parkinson's disease   As mentioned above.  * Hypothyroidism  Ordered TSH, continue levothyroxine.  * Generalized weakness  He may not be able to live in assisted living facility, so we'll call physical therapy  consult- and check if he is eligible to get  to  rehabilitation.  * History of coronary artery disease and CABG  Currently not on any cardiac medications at home and I will just continue the same.  All the records are reviewed and case discussed with ED provider. Management plans discussed with the patient, family and they are in agreement.  CODE STATUS: Advance Directive Documentation        Most Recent Value   Type of Advance Directive  Out of facility DNR (pink MOST or yellow form)   Pre-existing out of facility DNR order (yellow form or pink MOST form)     "MOST" Form in Place?         TOTAL TIME TAKING CARE OF THIS PATIENT: 50  minutes.    Altamese Dilling M.D on 05/14/2015   Between 7am to 6pm - Pager - [phone]  After 6pm go to [email or web address] - password [email or web address]  [facility]  Office  [phone]  CC: Primary care physician; Pcp Not In System   Note: This dictation was prepared with Dragon dictation along with smaller phrase technology. Any transcriptional errors that result from this process are unintentional.

## 2015-05-14 NOTE — ED Provider Notes (Addendum)
[facility] Blankens[facility] Emergency Department Provider Note  ____________________________________________  Time seen: 1335  I have reviewed the triage vital signs and the nursing notes.  History by daughter. History limited by patient's limited communication and somnolence.  HISTORY  Chief Complaint Fall     HPI Anthony Blankenship is a 81 y.o. male who has restless leg syndrome and stays at the [facility]. His daughter provides me information saying he has been getting weaker, declining, over the past 3 weeks. Today he had a fall. He has been brought to the emergency department for further evaluation. We are told that he is also displaying asymmetry in his face which is new.  On my exam, the patient is somnolent. He will awaken and follow commands. His daughter provides most of the history.    Past Medical History  Diagnosis Date  . Kidney stones   . Hypertension   . Anxiety   . Depression   . Parkinson's disease (HCC)   . Coronary artery disease     There are no active problems to display for this patient.   Past Surgical History  Procedure Laterality Date  . Appendectomy    . Coronary artery bypass graft      Current Outpatient Rx  Name  Route  Sig  Dispense  Refill  . Carbidopa-Levodopa-Entacapone (STALEVO 200 PO)   Oral   Take 1 tablet by mouth 4 (four) times daily.         Marland Kitchen levothyroxine (SYNTHROID, LEVOTHROID) 50 MCG tablet   Oral   Take 50 mcg by mouth daily before breakfast.           Allergies Review of patient's allergies indicates no known allergies.  History reviewed. No pertinent family history.  Social History Social History  Substance Use Topics  . Smoking status: Former Games developer  . Smokeless tobacco: Never Used  . Alcohol Use: No    Review of Systems Review of systems not possible due to patient's level of communication and somnolence. Level V caveat ____________________________________________   PHYSICAL EXAM:  VITAL  SIGNS: ED Triage Vitals  Enc Vitals Group     BP 05/14/15 1212 123/71 mmHg     Pulse Rate 05/14/15 1212 53     Resp 05/14/15 1212 18     Temp 05/14/15 1212 97.7 F (36.5 C)     Temp Source 05/14/15 1212 Oral     SpO2 05/14/15 1212 98 %     Weight 05/14/15 1212 153 lb (69.4 kg)     Height 05/14/15 1212  (1.753 m)     Head Cir --      Peak Flow --      Pain Score --      Pain Loc --      Pain Edu? --      Excl. in GC? --     Constitutional: Somnolent, awakens partially and follows commands. No acute distress.Marland Kitchen ENT   Head: Normocephalic and atraumatic.   Nose: No congestion/rhinnorhea.       Eyes:  The right eye appears slightly red with minimal swelling and some mild possible dried drainage. The left eye opens and functions more appropriately.      Mouth: No erythema, no swelling   Cardiovascular: Normal rate, regular rhythm, no murmur noted Respiratory:  Normal respiratory effort, no tachypnea.    Breath sounds are clear and equal bilaterally.  Gastrointestinal: Soft and nontender. No distention.  Back: No muscle spasm, no tenderness, no CVA tenderness. Musculoskeletal:  No deformity noted. Nontender with normal range of motion in all extremities.  No noted edema. Neurologic: Patient appears somnolent, he offers little verbal information. He does follow commands and moves all 4 extremities without difficulty. Grip strengths are equal. He is able to protrude his tongue and open his mouth well. When he does speak, he appears to be speaking out of one side of his mouth over the other. Otherwise, no asymmetry is noted on my exam. Neurologic is limited. Skin:  Skin is warm, dry. No rash noted. Psychiatric: Somnolent with little verbal communication..  ____________________________________________    LABS (pertinent positives/negatives)  Labs Reviewed  COMPREHENSIVE METABOLIC PANEL - Abnormal; Notable for the following:    Glucose, Bld 137 (*)    BUN 24 (*)    ALT 16 (*)     GFR calc non Af Amer 54 (*)    All other components within normal limits  CBC WITH DIFFERENTIAL/PLATELET - Abnormal; Notable for the following:    RBC 4.28 (*)    Platelets 138 (*)    All other components within normal limits  URINALYSIS COMPLETEWITH MICROSCOPIC ([facility] ONLY) - Abnormal; Notable for the following:    Color, Urine YELLOW (*)    APPearance CLEAR (*)    All other components within normal limits  TROPONIN I     ____________________________________________   EKG  ED ECG REPORT I, Ahnesti Townsend W, the attending physician, personally viewed and interpreted this ECG.   Date: 05/14/2015  EKG Time: 12:11  Rate: 57  Rhythm: Sinus bradycardia  Axis: Normal  Intervals: Normal  ST&T Change: None noted   Repeat EKG at 1527  ED ECG REPORT I, Betti Goodenow W, the attending physician, personally viewed and interpreted this ECG.   Date: 05/14/2015  EKG Time: 1527  Rate: 53  Rhythm: Sinus bradycardia with some irregular artifact  Axis: Normal  Intervals: Normal  ST&T Change: None noted    ____________________________________________    RADIOLOGY  CT head: IMPRESSION: 1. 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 2. Stable atrophy and white matter disease. 3. No acute intracranial abnormality. ____________________________________________ ____________________________________________   INITIAL IMPRESSION / ASSESSMENT AND PLAN / ED COURSE  Pertinent labs & imaging results that were available during my care of the patient were reviewed by me and considered in my medical decision making (see chart for details).  81 year old male with general weakness, falls, and now question of droop in the face. The patient does not speak much, but when he does, he appears to be speaking out of one side of his mouth more than the other. His daughter reports that this is new. The remainder of his neurologic exam is  normal.  ----------------------------------------- 3:26 PM on 05/14/2015 -----------------------------------------  Patient's labs overall look reasonable. His feeling is 24. Glucose of 137. Hemoglobin of 13.7. Urine looks good with no sign of infection.  Examination the patient finds him in similar state as when he first arrived. He appears little bit somnolent. I've reviewed his situation further with his daughter is very appreciative of the care I provided. I've cared for her in the past as well. She reports that he has been walking up and down the hall at the [facility] due to his restless leg syndrome. Walking seems to help this. She is concerned about him not getting enough sleep. He has been started on a new medication for the restless leg syndrome today. [facility] will [facility] and give this medicine a chance  to help.  We will give him 500 and also normal saline before he leaves due to the slightly high BUN level.  ----------------------------------------- 3:50 PM on 05/14/2015 -----------------------------------------  The patient is becoming more agitated, more tremulous. I do not think she'll feel the function back at assisted living. His daughter doesn't either. It is a difficult situation for the patient. He needs additional nursing care and possibly other medications to help with his restless leg, parkinsonian-like symptoms.  I will treat him with some carbidopa levodopa as well as with a small amount of Ativan.  We will admit him to the hospital for ongoing care. This is been discussed with Dr. Scot Dock  ____________________________________________   FINAL CLINICAL IMPRESSION(S) / ED DIAGNOSES  Final diagnoses:  Restless leg syndrome  Fall, initial encounter  General weakness  Parkinson's disease (HCC)      Darien Ramus, MD 05/14/15 1533   Darien Ramus, MD 05/14/15 [phone]

## 2015-05-15 DIAGNOSIS — G2581 Restless legs syndrome: Secondary | ICD-10-CM | POA: Diagnosis not present

## 2015-05-15 LAB — CBC
HEMATOCRIT: 43 % (ref 40.0–52.0)
HEMOGLOBIN: 14.6 g/dL (ref 13.0–18.0)
MCH: 32.1 pg (ref 26.0–34.0)
MCHC: 33.9 g/dL (ref 32.0–36.0)
MCV: 94.7 fL (ref 80.0–100.0)
Platelets: 133 10*3/uL — ABNORMAL LOW (ref 150–440)
RBC: 4.54 MIL/uL (ref 4.40–5.90)
RDW: 12.5 % (ref 11.5–14.5)
WBC: 7.2 10*3/uL (ref 3.8–10.6)

## 2015-05-15 LAB — BASIC METABOLIC PANEL
ANION GAP: 6 (ref 5–15)
BUN: 22 mg/dL — AB (ref 6–20)
CHLORIDE: 104 mmol/L (ref 101–111)
CO2: 30 mmol/L (ref 22–32)
Calcium: 9.3 mg/dL (ref 8.9–10.3)
Creatinine, Ser: 0.97 mg/dL (ref 0.61–1.24)
GFR calc Af Amer: >60 mL/min (ref >60)
GLUCOSE: 90 mg/dL (ref 65–99)
POTASSIUM: 4.1 mmol/L (ref 3.5–5.1)
Sodium: 140 mmol/L (ref 135–145)

## 2015-05-15 LAB — MRSA PCR SCREENING: MRSA by PCR: NEGATIVE

## 2015-05-15 MED ORDER — ROPINIROLE HCL 1 MG PO TABS: 1.0000 mg | ORAL_TABLET | Freq: Three times a day (TID) | ORAL | Status: DC

## 2015-05-15 NOTE — [place] FL2 (Signed)
  [place] MEDICAID FL2 LEVEL OF CARE SCREENING TOOL     IDENTIFICATION  Patient Name: Anthony Blankenship Birthdate: July 25, 1924 Sex: male Admission Date (Current Location): 05/14/2015  [facility] [place]  [facility] [facility]  ([id number]) Facility and Address:  [facility], [address], [place], [place] [address]      Provider Number: [id number] [phone]([id number])  Attending Physician Name and Address:  Enedina FinnerSona Patel, MD  Relative Name and Phone Number:       Current Level of Care: Hospital Recommended Level of Care: Assisted Living Facility Prior Approval Number:    Date Approved/Denied:   PASRR Number:  ( [id number] )  Discharge Plan: Domiciliary (Rest home)    Current Diagnoses: Patient Active Problem List   Diagnosis Date Noted  . Restlessness 05/14/2015    Orientation ACTIVITIES/SOCIAL BLADDER RESPIRATION    Self, Time, Situation  Passive Incontinent Normal  BEHAVIORAL SYMPTOMS/MOOD NEUROLOGICAL BOWEL NUTRITION STATUS   (none )  (none ) Continent Diet (Diet: Heart Healthy )  PHYSICIAN VISITS COMMUNICATION OF NEEDS Height & Weight Skin  30 days Verbally 5\' 9"  (175.3 cm) 153 lbs. Normal          AMBULATORY STATUS RESPIRATION    Supervision limited Normal      Personal Care Assistance Level of Assistance  Bathing, Feeding, Dressing Bathing Assistance: Independent Feeding assistance: Independent Dressing Assistance: Independent      Functional Limitations Info  Sight, Hearing, Speech Sight Info: Adequate Hearing Info: Adequate Speech Info: Adequate       SPECIAL CARE FACTORS FREQUENCY                      Additional Factors Info  Code Status Code Status Info:  (DNR)             Current Medications (05/15/2015): Current Facility-Administered Medications  Medication Dose Route Frequency Provider Last Rate Last Dose  . carbidopa-levodopa (SINEMET IR) 25-100 MG per tablet immediate release 1 tablet  1  tablet Oral TID Darien Ramusavid W Kaminski, MD   1 tablet at 05/15/15 1003  . carbidopa-levodopa (SINEMET IR) 25-100 MG per tablet immediate release 2 tablet  2 tablet Oral QID Altamese DillingVaibhavkumar Vachhani, MD   2 tablet at 05/15/15 1004   And  . entacapone (COMTAN) tablet 200 mg  200 mg Oral QID Altamese DillingVaibhavkumar Vachhani, MD   200 mg at 05/15/15 1005  . heparin injection 5,000 Units  5,000 Units Subcutaneous 3 times per day Altamese DillingVaibhavkumar Vachhani, MD   5,000 Units at 05/15/15 0525  . levothyroxine (SYNTHROID, LEVOTHROID) tablet 50 mcg  50 mcg Oral QAC breakfast Altamese DillingVaibhavkumar Vachhani, MD   50 mcg at 05/15/15 1003   Do not use this list as official medication orders. Please verify with discharge summary.  Discharge Medications:   Medication List    TAKE these medications        levothyroxine 50 MCG tablet  Commonly known as:  SYNTHROID, LEVOTHROID  Take 50 mcg by mouth daily before breakfast.     rOPINIRole 1 MG tablet  Commonly known as:  REQUIP  Take 1 tablet (1 mg total) by mouth 3 (three) times daily.     STALEVO 200 PO  Take 1 tablet by mouth 4 (four) times daily.        Relevant Imaging Results:  Relevant Lab Results:  Recent Labs    Additional Information  (SSN: [id number])  Haig ProphetMorgan, Yazmen Briones G, LCSW

## 2015-05-15 NOTE — Evaluation (Signed)
Physical Therapy Evaluation Patient Details Name: Anthony Blankenship MRN: [id number] DOB: 1925-02-10 Today's Date: 05/15/2015   History of Present Illness  restless leg syndrome flare on Parkinson's Dz  Clinical Impression  Pt is able to ambulate well with walker (reports it does not handle as well as his rollator).  He has good confidence and is relatively fast with ambulation with no LOBs or fatigue.  Daughter reports that he is walking at about his baseline and states that he will stay active at his ALF per her PLOF. Pt does not require further PT intervention.     Follow Up Recommendations No PT follow up    Equipment Recommendations       Recommendations for Other Services       Precautions / Restrictions Precautions Precautions: Fall Restrictions Weight Bearing Restrictions: No      Mobility  Bed Mobility Overal bed mobility: Needs Assistance Bed Mobility: Supine to Sit     Supine to sit: Min assist     General bed mobility comments: Pt able to get to EOB and start lifting torso, just needed a little assist to get upright  Transfers Overall transfer level: Modified independent Equipment used: Rolling walker (2 wheeled)             General transfer comment: Pt needs some cuing to insure he is set up properly to get to standing, shows good effort and only needs CGA to get to standing  Ambulation/Gait Ambulation/Gait assistance: Supervision Ambulation Distance (Feet): 400 Feet Assistive device: Rolling walker (2 wheeled)       General Gait Details: Pt with somewhat stooped posture/forward lean, but he was able to maintain good speed and confidence for 2 laps around the nurses' station with minimal fatigue and no signficant safety concerns.    Stairs            Wheelchair Mobility    Modified Rankin (Stroke Patients Only)       Balance Overall balance assessment: Modified Independent                                            Pertinent Vitals/Pain Pain Assessment: No/denies pain    Home Living Family/patient expects to be discharged to:: Assisted living                      Prior Function Level of Independence: Independent with assistive device(s)         Comments: Daughter reports pt often spends a lot of the day walking with his 4WW because of his restlessness     Hand Dominance        Extremity/Trunk Assessment   Upper Extremity Assessment: Overall WFL for tasks assessed (some weakness with shoulder elevation, which is baseline)           Lower Extremity Assessment: Overall WFL for tasks assessed         Communication   Communication:  (soft, mumbling voice)  Cognition Arousal/Alertness: Awake/alert Behavior During Therapy: WFL for tasks assessed/performed Overall Cognitive Status: Within Functional Limits for tasks assessed                      General Comments      Exercises        Assessment/Plan    PT Assessment Patent does not need any further PT services  PT Diagnosis Difficulty walking;Generalized weakness   PT Problem List    PT Treatment Interventions     PT Goals (Current goals can be found in the Care Plan section) Acute Rehab PT Goals Patient Stated Goal: Go back to the [facility]    Frequency     Barriers to discharge        Co-evaluation               End of Session Equipment Utilized During Treatment: Gait belt Activity Tolerance: Patient tolerated treatment well Patient left: in chair;with family/visitor present Nurse Communication: Mobility status    Functional Assessment Tool Used: clincal judgement Functional Limitation: Mobility: Walking and moving around Mobility: Walking and Moving Around Current Status [phone]): At least 1 percent but less than 20 percent impaired, limited or restricted Mobility: Walking and Moving Around Goal Status [phone]): At least 1 percent but less than 20 percent impaired, limited or  restricted Mobility: Walking and Moving Around Discharge Status [phone]): At least 1 percent but less than 20 percent impaired, limited or restricted    Time: 0940-1000 PT Time Calculation (min) (ACUTE ONLY): 20 min   Charges:   PT Evaluation $Initial PT Evaluation Tier I: 1 Procedure     PT G Codes:   PT G-Codes **NOT FOR INPATIENT CLASS** Functional Assessment Tool Used: clincal judgement Functional Limitation: Mobility: Walking and moving around Mobility: Walking and Moving Around Current Status ([id number]): At least 1 percent but less than 20 percent impaired, limited or restricted Mobility: Walking and Moving Around Goal Status [phone]): At least 1 percent but less than 20 percent impaired, limited or restricted Mobility: Walking and Moving Around Discharge Status [phone]): At least 1 percent but less than 20 percent impaired, limited or restricted   Loran Senters, PT, DPT [phone]  Anthony Blankenship 05/15/2015, 10:51 AM

## 2015-05-15 NOTE — Clinical Social Work Note (Signed)
Clinical Social Work Assessment  Patient Details  Name: Anthony Blankenship MRN: [id number] Date of Birth: 03/25/1925  Date of referral:  05/15/15               Reason for consult:  Facility Placement, Other (Comment Required) (From [facility] )                Permission sought to share information with:  Facility Contact Representative Permission granted to share information::  Yes, Verbal Permission Granted  Name::      [facility]   Agency::     Relationship::     Contact Information:     Housing/Transportation Living arrangements for the past 2 months:  Assisted Living Facility Source of Information:  Adult Children Patient Interpreter Needed:  None Criminal Activity/Legal Involvement Pertinent to Current Situation/Hospitalization:  No - Comment as needed Significant Relationships:  Adult Children Lives with:  Facility Resident Do you feel safe going back to the place where you live?  Yes Need for family participation in patient care:  Yes (Comment)  Care giving concerns:  Patient is a resident at [facility].    Social Worker assessment / plan: Clinical Social Worker (CSW) received verbal consult from MD that patient is from the [facility] and is ready to return. PT worked with patient today and recommended no PT follow up. CSW met with patient and his daughter Anthony Blankenship was at bedside. Patient was asleep and did not participate in assessment. Daughter reported that patient is from [facility] and walks very well. Daughter is agreeable for patient to return to [facility]. Per daughter patient will require EMS for transport.    Employment status:  Retired Insurance information:  Medicare PT Recommendations:  No Follow Up Information / Referral to community resources:  Other (Comment Required) (Assisted Living Facility )  Patient/Family's Response to care: Daughter is agreeable for patient to return to [facility].   Patient/Family's Understanding of and Emotional Response  to Diagnosis, Current Treatment, and Prognosis: Daughter was pleasant and thanked CSW for visit.   Emotional Assessment Appearance:  Appears stated age Attitude/Demeanor/Rapport:  Unable to Assess Affect (typically observed):  Unable to Assess Orientation:  Oriented to Self, Oriented to Place Alcohol / Substance use:  Not Applicable Psych involvement (Current and /or in the community):  No (Comment)  Discharge Needs  Concerns to be addressed:  No discharge needs identified Readmission within the last 30 days:  No Current discharge risk:  None Barriers to Discharge:  No Barriers Identified   Morgan,  G, LCSW 05/15/2015, 12:05 PM  

## 2015-05-15 NOTE — Consult Note (Signed)
Reason for Consult: Parkinsons Referring Physician: Dr. Silvestre Moment is an 81 y.o. male.  HPI:  81 yo RHD M presents to [facility] due to inability to sleep from his restless legs.  Pt also notes that his tremors have increased over the past 3 weeks and have become very uncomfortable.  Pt was diagnosed with Parkinsons in 1993 and has been on medications since.  He has had a slow decline in function and is still ambulating now.  He denies any dyskinesias or freezing episodes.   Past Medical History  Diagnosis Date  . Kidney stones   . Hypertension   . Anxiety   . Parkinson's disease ([place])   . Coronary artery disease   . Restless leg syndrome     Past Surgical History  Procedure Laterality Date  . Appendectomy    . Coronary artery bypass graft      Family History  Problem Relation Age of Onset  . Liver disease Father     Social History:  reports that he has never smoked. He has never used smokeless tobacco. He reports that he does not drink alcohol. His drug history is not on file.  Allergies: No Known Allergies  Medications: personally reviewed by me as per chart  Results for orders placed or performed during the hospital encounter of 05/14/15 (from the past 48 hour(s))  Comprehensive metabolic panel     Status: Abnormal   Collection Time: 05/14/15 12:27 PM  Result Value Ref Range   Sodium 140 135 - 145 mmol/L   Potassium 4.2 3.5 - 5.1 mmol/L   Chloride 104 101 - 111 mmol/L   CO2 30 22 - 32 mmol/L   Glucose, Bld 137 (H) 65 - 99 mg/dL   BUN 24 (H) 6 - 20 mg/dL   Creatinine, Ser 1.17 0.61 - 1.24 mg/dL   Calcium 9.1 8.9 - 10.3 mg/dL   Total Protein 6.6 6.5 - 8.1 g/dL   Albumin 4.3 3.5 - 5.0 g/dL   AST 22 15 - 41 U/L   ALT 16 (L) 17 - 63 U/L   Alkaline Phosphatase 48 38 - 126 U/L   Total Bilirubin 0.7 0.3 - 1.2 mg/dL   GFR calc non Af Amer 54 (L) >60 mL/min   GFR calc Af Amer >60 >60 mL/min    Comment: (NOTE) The eGFR has been calculated using the CKD EPI  equation. This calculation has not been validated in all clinical situations. eGFR's persistently <60 mL/min signify possible Chronic Kidney Disease.    Anion gap 6 5 - 15  Troponin I     Status: None   Collection Time: 05/14/15 12:27 PM  Result Value Ref Range   Troponin I <0.03 <0.031 ng/mL    Comment:        NO INDICATION OF MYOCARDIAL INJURY.   CBC with Differential     Status: Abnormal   Collection Time: 05/14/15 12:27 PM  Result Value Ref Range   WBC 6.5 3.8 - 10.6 K/uL   RBC 4.28 (L) 4.40 - 5.90 MIL/uL   Hemoglobin 13.7 13.0 - 18.0 g/dL   HCT 40.7 40.0 - 52.0 %   MCV 95.1 80.0 - 100.0 fL   MCH 32.1 26.0 - 34.0 pg   MCHC 33.7 32.0 - 36.0 g/dL   RDW 12.4 11.5 - 14.5 %   Platelets 138 (L) 150 - 440 K/uL   Neutrophils Relative % 69 %   Neutro Abs 4.5 1.4 - 6.5 K/uL  Lymphocytes Relative 24 %   Lymphs Abs 1.6 1.0 - 3.6 K/uL   Monocytes Relative 6 %   Monocytes Absolute 0.4 0.2 - 1.0 K/uL   Eosinophils Relative 1 %   Eosinophils Absolute 0.1 0 - 0.7 K/uL   Basophils Relative 0 %   Basophils Absolute 0.0 0 - 0.1 K/uL  Urinalysis complete, with microscopic     Status: Abnormal   Collection Time: 05/14/15 12:27 PM  Result Value Ref Range   Color, Urine YELLOW (A) YELLOW   APPearance CLEAR (A) CLEAR   Glucose, UA NEGATIVE NEGATIVE mg/dL   Bilirubin Urine NEGATIVE NEGATIVE   Ketones, ur NEGATIVE NEGATIVE mg/dL   Specific Gravity, Urine 1.020 1.005 - 1.030   Hgb urine dipstick NEGATIVE NEGATIVE   pH 6.0 5.0 - 8.0   Protein, ur NEGATIVE NEGATIVE mg/dL   Nitrite NEGATIVE NEGATIVE   Leukocytes, UA NEGATIVE NEGATIVE   RBC / HPF 0-5 0 - 5 RBC/hpf   WBC, UA 0-5 0 - 5 WBC/hpf   Bacteria, UA NONE SEEN NONE SEEN   Squamous Epithelial / LPF NONE SEEN NONE SEEN   Mucous PRESENT   TSH     Status: Abnormal   Collection Time: 05/14/15 12:27 PM  Result Value Ref Range   TSH 4.970 (H) 0.350 - 4.500 uIU/mL  MRSA PCR Screening     Status: None   Collection Time: 05/15/15  2:51  AM  Result Value Ref Range   MRSA by PCR NEGATIVE NEGATIVE    Comment:        The GeneXpert MRSA Assay (FDA approved for NASAL specimens only), is one component of a comprehensive MRSA colonization surveillance program. It is not intended to diagnose MRSA infection nor to guide or monitor treatment for MRSA infections.   CBC     Status: Abnormal   Collection Time: 05/15/15  4:32 AM  Result Value Ref Range   WBC 7.2 3.8 - 10.6 K/uL   RBC 4.54 4.40 - 5.90 MIL/uL   Hemoglobin 14.6 13.0 - 18.0 g/dL   HCT 43.0 40.0 - 52.0 %   MCV 94.7 80.0 - 100.0 fL   MCH 32.1 26.0 - 34.0 pg   MCHC 33.9 32.0 - 36.0 g/dL   RDW 12.5 11.5 - 14.5 %   Platelets 133 (L) 150 - 440 K/uL  Basic metabolic panel     Status: Abnormal   Collection Time: 05/15/15  4:32 AM  Result Value Ref Range   Sodium 140 135 - 145 mmol/L   Potassium 4.1 3.5 - 5.1 mmol/L   Chloride 104 101 - 111 mmol/L   CO2 30 22 - 32 mmol/L   Glucose, Bld 90 65 - 99 mg/dL   BUN 22 (H) 6 - 20 mg/dL   Creatinine, Ser 0.97 0.61 - 1.24 mg/dL   Calcium 9.3 8.9 - 10.3 mg/dL   GFR calc non Af Amer >60 >60 mL/min   GFR calc Af Amer >60 >60 mL/min    Comment: (NOTE) The eGFR has been calculated using the CKD EPI equation. This calculation has not been validated in all clinical situations. eGFR's persistently <60 mL/min signify possible Chronic Kidney Disease.    Anion gap 6 5 - 15    Ct Head Wo Contrast  05/14/2015  CLINICAL DATA:  Fall from bed. Personal history Parkinson's disease. Right-sided facial droop. Weakness. EXAM: CT HEAD WITHOUT CONTRAST TECHNIQUE: Contiguous axial images were obtained from the base of the skull through the vertex without intravenous contrast. COMPARISON:  CT head without contrast 10/18/2014. FINDINGS: The basal ganglia are intact. A remote punctate left insular ribbon infarct is noted. No other acute cortical infarcts are present. No significant white matter disease is present. The ventricles are of normal  size. No significant extra-axial fluid collection is present. The paranasal sinuses and mastoid air cells are clear. Calvarium is intact. No significant extracranial soft tissue lesion is present. IMPRESSION: 1. 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 2. Stable atrophy and white matter disease. 3. No acute intracranial abnormality. Electronically Signed   By: San Morelle M.D.   On: 05/14/2015 14:34    Review of Systems  Constitutional: Negative.   HENT: Negative.   Eyes: Negative.   Respiratory: Negative.   Cardiovascular: Negative.   Gastrointestinal: Negative.   Genitourinary: Negative.   Musculoskeletal: Negative.   Skin: Negative.   Neurological: Positive for tremors. Negative for dizziness, tingling, sensory change, speech change, focal weakness, seizures and loss of consciousness.  Psychiatric/Behavioral: Negative for depression and substance abuse. The patient has insomnia.    Blood pressure 110/70, pulse 55, temperature 98 F (36.7 C), temperature source Oral, resp. rate 18, height _0  (1.753 m), weight 69.4 kg (153 lb), SpO2 98 %. Physical Exam  Nursing note and vitals reviewed. Constitutional: He appears well-developed and well-nourished. No distress.  HENT:  Head: Normocephalic and atraumatic.  Right Ear: External ear normal.  Left Ear: External ear normal.  Nose: Nose normal.  Mouth/Throat: Oropharynx is clear and moist.  Eyes: Conjunctivae and EOM are normal. Pupils are equal, round, and reactive to light. No scleral icterus.  Neck: Normal range of motion. Neck supple.  Cardiovascular: Normal rate, regular rhythm, normal heart sounds and intact distal pulses.   No murmur heard. Respiratory: Effort normal and breath sounds normal. No respiratory distress.  GI: Soft. Bowel sounds are normal.  Musculoskeletal: Normal range of motion. He exhibits no edema.  Neurological:  A+Ox2 not time, nl language, mild dysarthria PERRLA, EOMI with  slow saccades, nl VF, face symmetric with chin tremor, tongue midline 4+/5 B, increased tone L>R, severe resting tremor L>R, moderate bradykinesia FTN WNL 1+/4 B, mute plantars Intact to pin and temp B  Skin: Skin is warm. He is not diaphoretic.  Psychiatric: He has a normal mood and affect.    Assessment/Plan: 1.  Parkinsons-  Not quite controlled but tremor is often the hardest part to treat;  Given that pt has had it for 20+ years, I believe that he is doing quite well overall 2.  Restless leg-  Not controlled and symptomatic with sleep deficiency 3.  Dementia-  Mild and likely related to Parkinsons -  Continue Stalevo QID -  Ok to start requip 31m TID but watch for hallucinations and other side effects since it is a higher dose to start -  Check ferritin level and replace as necessary -  D/c per therapy -  Will sign off, please call with questions -  [facility] or sooner if problems occur  Velmer Broadfoot 05/15/2015, 2:08 PM

## 2015-05-15 NOTE — Progress Notes (Signed)
Patient is medically stable for D/C to [facility] today. Per Asher MuirJamie at the [facility] patient can return. Clinical Child psychotherapistocial Worker (CSW) faxed FL2 and D[facility] Summary [facility]. CSW prepared D/C packet. RN will arrange EMS for transport. Patient's daughter Eber JonesCarolyn is at bedside and aware of above. Please reconsult if future social work needs arise. CSW signing off.   Jetta LoutBailey Blankenship, LCSWA [phone]

## 2015-05-15 NOTE — Plan of Care (Signed)
Problem: Education: Goal: Knowledge of [facility] Education information/materials will improve Outcome: Completed/Met Date Met:  05/15/15 Family at bedside. Pt awake and aware this morning  Problem: Safety: Goal: Ability to remain free from injury will improve Outcome: Completed/Met Date Met:  05/15/15 Pt is injury free. To be dc today  Problem: Health Behavior/Discharge Planning: Goal: Ability to manage health-related needs will improve Outcome: Completed/Met Date Met:  05/15/15 Pt daughter at bedside. She sees after all of his needs.   Problem: Skin Integrity: Goal: Risk for impaired skin integrity will decrease Outcome: Completed/Met Date Met:  05/15/15 Pt turned and ambulated and sitting in chair. No change in skin integrity  Problem: Tissue Perfusion: Goal: Risk factors for ineffective tissue perfusion will decrease Outcome: Completed/Met Date Met:  05/15/15 Pt turned and ambulated. Sitting in chair.   Problem: Fluid Volume: Goal: Ability to maintain a balanced intake and output will improve Outcome: Completed/Met Date Met:  05/15/15 Tolerating po well.  Adequate fluid intake at dc  Problem: Nutrition: Goal: Adequate nutrition will be maintained Outcome: Completed/Met Date Met:  05/15/15 Ate 75% of breakfast.

## 2015-05-15 NOTE — Progress Notes (Signed)
Pt stable. [facility] per daughter request. VSS. tolerating PO well.

## 2015-05-15 NOTE — Discharge Summary (Signed)
[facility] [place] at [facility]   PATIENT NAME: Anthony Blankenship    MR#:  [id number]  DATE OF BIRTH:  08/25/1924  DATE OF ADMISSION:  05/14/2015 ADMITTING PHYSICIAN: Altamese DillingVaibhavkumar Vachhani, MD  DATE OF DISCHARGE: 05/15/2015  PRIMARY CARE PHYSICIAN: Pcp Not In System    ADMISSION DIAGNOSIS:  Restless leg syndrome [G25.81] General weakness [R53.1] Parkinson's disease (HCC) [G20] Fall, initial encounter [W19.XXXA]  DISCHARGE DIAGNOSIS:  Restless leg syndrome Parkinson's disease hypothyroidism SECONDARY DIAGNOSIS:   Past Medical History  Diagnosis Date  . Kidney stones   . Hypertension   . Anxiety   . Parkinson's disease (HCC)   . Coronary artery disease   . Restless leg syndrome     HOSPITAL COURSE:  Anthony Reapdward Wallman is a 81 y.o. male with a known history of hypertension, Parkinson's disease, coronary artery disease, restless leg syndrome- lives in an assisted living facility- but having worsening in his restlessness for last few days, history obtained from his daughter and she says that for last several nights he could not sleep because of severe restless leg syndrome  * Excessive shaking and restless leg  continue carbidopa levodopa spoke withNeurology Dr Katrinka Blazingsmith and ok to cont stalevo and ropirinol ( just started y'day) Patient did very well with physical therapy. He [facility] at [facility] and no PT is recommended at present.  * Parkinson's disease  As mentioned above.  * Hypothyroidism continue levothyroxine.  * Generalized weakness Seen by physical therapy okay to return to the [facility] no PT needed. Patient will continue his walker  Discussed with patient's daughter about medications and follow-up with doctors making house calls and/or neurology. Dr. appreciated it. We'll [facility].   CONSULTS OBTAINED:    neurology Dr. Katrinka BlazingSmith  DRUG ALLERGIES:  No Known Allergies  DISCHARGE MEDICATIONS:    Current Discharge Medication List    START taking these medications   Details  rOPINIRole (REQUIP) 1 MG tablet Take 1 tablet (1 mg total) by mouth 3 (three) times daily. Qty: 90 tablet, Refills: 0      CONTINUE these medications which have NOT CHANGED   Details  Carbidopa-Levodopa-Entacapone (STALEVO 200 PO) Take 1 tablet by mouth 4 (four) times daily.    levothyroxine (SYNTHROID, LEVOTHROID) 50 MCG tablet Take 50 mcg by mouth daily before breakfast.        If you experience worsening of your admission symptoms, develop shortness of breath, life threatening emergency, suicidal or homicidal thoughts you must seek medical attention immediately by calling 911 or calling your MD immediately  if symptoms less severe.  You Must read complete instructions/literature along with all the possible adverse reactions/side effects for all the Medicines you take and that have been prescribed to you. Take any new Medicines after you have completely understood and accept all the possible adverse reactions/side effects.   Please note  You were cared for by a hospitalist during your hospital stay. If you have any questions about your discharge medications or the care you received while you were in the hospital after you are discharged, you can call the unit and asked to speak with the hospitalist on call if the hospitalist that took care of you is not available. Once you are discharged, your primary care physician will handle any further medical issues. Please note that NO REFILLS for any discharge medications will be authorized once you are discharged, as it is imperative that you return to your primary care physician (or  establish a relationship with a primary care physician if you do not have one) for your aftercare needs so that they can reassess your need for medications and monitor your lab values. Today   SUBJECTIVE  Resting tremors on and off Slept good last nite   VITAL SIGNS:  Blood  pressure 106/71, pulse 55, temperature 98.3 F (36.8 C), temperature source Oral, resp. rate 17, height  (1.753 m), weight 69.4 kg (153 lb), SpO2 98 %.  I/O:   Intake/Output Summary (Last 24 hours) at 05/15/15 1030 Last data filed at 05/15/15 0800  Gross per 24 hour  Intake    600 ml  Output      0 ml  Net    600 ml    PHYSICAL EXAMINATION:  GENERAL:  81 y.o.-year-old patient lying in the bed with no acute distress.  EYES: Pupils equal, round, reactive to light and accommodation. No scleral icterus. Extraocular muscles intact.  HEENT: Head atraumatic, normocephalic. Oropharynx and nasopharynx clear.  NECK:  Supple, no jugular venous distention. No thyroid enlargement, no tenderness.  LUNGS: Normal breath sounds bilaterally, no wheezing, rales,rhonchi or crepitation. No use of accessory muscles of respiration.  CARDIOVASCULAR: S1, S2 normal. No murmurs, rubs, or gallops.  ABDOMEN: Soft, non-tender, non-distended. Bowel sounds present. No organomegaly or mass.  EXTREMITIES: No pedal edema, cyanosis, or clubbing.  NEUROLOGIC: Cranial nerves II through XII are intact. Muscle strength 5/5 in all extremities. Sensation intact. Gait not checked. Rest tremors+ PSYCHIATRIC: The patient is alert and oriented x 3.  SKIN: No obvious rash, lesion, or ulcer.   DATA REVIEW:   CBC   Recent Labs Lab 05/15/15 0432  WBC 7.2  HGB 14.6  HCT 43.0  PLT 133*    Chemistries   Recent Labs Lab 05/14/15 1227 05/15/15 0432  NA 140 140  K 4.2 4.1  CL 104 104  CO2 30 30  GLUCOSE 137* 90  BUN 24* 22*  CREATININE 1.17 0.97  CALCIUM 9.1 9.3  AST 22  --   ALT 16*  --   ALKPHOS 48  --   BILITOT 0.7  --     Microbiology Results   Recent Results (from the past 240 hour(s))  MRSA PCR Screening     Status: None   Collection Time: 05/15/15  2:51 AM  Result Value Ref Range Status   MRSA by PCR NEGATIVE NEGATIVE Final    Comment:        The GeneXpert MRSA Assay (FDA approved for NASAL  specimens only), is one component of a comprehensive MRSA colonization surveillance program. It is not intended to diagnose MRSA infection nor to guide or monitor treatment for MRSA infections.     RADIOLOGY:  Ct Head Wo Contrast  05/14/2015  CLINICAL DATA:  Fall from bed. Personal history Parkinson's disease. Right-sided facial droop. Weakness. EXAM: CT HEAD WITHOUT CONTRAST TECHNIQUE: Contiguous axial images were obtained from the base of the skull through the vertex without intravenous contrast. COMPARISON:  CT head without contrast 10/18/2014. FINDINGS: The basal ganglia are intact. A remote punctate left insular ribbon infarct is noted. No other acute cortical infarcts are present. No significant white matter disease is present. The ventricles are of normal size. No significant extra-axial fluid collection is present. The paranasal sinuses and mastoid air cells are clear. Calvarium is intact. No significant extracranial soft tissue lesion is present. IMPRESSION: 1. 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 2. Stable atrophy and white matter disease. 3.  No acute intracranial abnormality. Electronically Signed   By: Marin Roberts M.D.   On: 05/14/2015 14:34     Management plans discussed with the patient, family and they are in agreement.  CODE STATUS:     Code Status Orders        Start     Ordered   05/14/15 1906  Do not attempt resuscitation (DNR)   Continuous    Question Answer Comment  In the event of cardiac or respiratory ARREST Do not call a "code blue"   In the event of cardiac or respiratory ARREST Do not perform Intubation, CPR, defibrillation or ACLS   In the event of cardiac or respiratory ARREST Use medication by any route, position, wound care, and other measures to relive pain and suffering. May use oxygen, suction and manual treatment of airway obstruction as needed for comfort.      05/14/15 1905    Advance Directive  Documentation        Most Recent Value   Type of Advance Directive  Out of facility DNR (pink MOST or yellow form)   Pre-existing out of facility DNR order (yellow form or pink MOST form)     "MOST" Form in Place?        TOTAL TIME TAKING CARE OF THIS PATIENT: 40 minutes.    Mitchell Epling M.D on 05/15/2015 at 10:30 AM  Between 7am to 6pm - Pager - [phone] After 6pm go to [email or web address] - password [email or web address]  [facility]  Office  [phone]  CC: Primary care physician; Pcp Not In System

## 2015-05-15 NOTE — Progress Notes (Signed)
[facility] to [facility].  Pt dc via ems. A&O. VSS. No pain or distress

## 2015-05-18 ENCOUNTER — Emergency Department: Payer: Medicare Other

## 2015-05-18 ENCOUNTER — Emergency Department
Admission: EM | Admit: 2015-05-18 | Discharge: 2015-05-18 | Disposition: A | Discharge status: Home / Self Care | Payer: Medicare Other | Attending: Emergency Medicine | Admitting: Emergency Medicine

## 2015-05-18 ENCOUNTER — Encounter: Payer: Self-pay | Admitting: *Deleted

## 2015-05-18 DIAGNOSIS — I10 Essential (primary) hypertension: Secondary | ICD-10-CM | POA: Diagnosis not present

## 2015-05-18 DIAGNOSIS — W19XXXD Unspecified fall, subsequent encounter: Secondary | ICD-10-CM

## 2015-05-18 DIAGNOSIS — G2581 Restless legs syndrome: Secondary | ICD-10-CM | POA: Insufficient documentation

## 2015-05-18 DIAGNOSIS — R5383 Other fatigue: Secondary | ICD-10-CM | POA: Diagnosis not present

## 2015-05-18 DIAGNOSIS — R531 Weakness: Secondary | ICD-10-CM | POA: Insufficient documentation

## 2015-05-18 DIAGNOSIS — Z043 Encounter for examination and observation following other accident: Secondary | ICD-10-CM | POA: Diagnosis present

## 2015-05-18 DIAGNOSIS — W1839XD Other fall on same level, subsequent encounter: Secondary | ICD-10-CM | POA: Diagnosis not present

## 2015-05-18 LAB — CBC WITH DIFFERENTIAL/PLATELET
Basophils Absolute: 0 10*3/uL (ref 0–0.1)
Basophils Relative: 0 %
Eosinophils Absolute: 0.1 10*3/uL (ref 0–0.7)
Eosinophils Relative: 1 %
HCT: 41.7 % (ref 40.0–52.0)
Hemoglobin: 14.2 g/dL (ref 13.0–18.0)
Lymphocytes Relative: 26 %
Lymphs Abs: 1.6 10*3/uL (ref 1.0–3.6)
MCH: 32.2 pg (ref 26.0–34.0)
MCHC: 34.1 g/dL (ref 32.0–36.0)
MCV: 94.7 fL (ref 80.0–100.0)
Monocytes Absolute: 0.3 10*3/uL (ref 0.2–1.0)
Monocytes Relative: 6 %
Neutro Abs: 4 10*3/uL (ref 1.4–6.5)
Neutrophils Relative %: 67 %
Platelets: 127 10*3/uL — ABNORMAL LOW (ref 150–440)
RBC: 4.4 MIL/uL (ref 4.40–5.90)
RDW: 12.3 % (ref 11.5–14.5)
WBC: 6 10*3/uL (ref 3.8–10.6)

## 2015-05-18 LAB — URINALYSIS COMPLETE WITH MICROSCOPIC (ARMC ONLY)
Bacteria, UA: NONE SEEN
Bilirubin Urine: NEGATIVE
Glucose, UA: NEGATIVE mg/dL
Hgb urine dipstick: NEGATIVE
KETONES UR: NEGATIVE mg/dL
LEUKOCYTES UA: NEGATIVE
Nitrite: NEGATIVE
PH: 7 (ref 5.0–8.0)
PROTEIN: NEGATIVE mg/dL
SPECIFIC GRAVITY, URINE: 1.009 (ref 1.005–1.030)
Squamous Epithelial / LPF: NONE SEEN

## 2015-05-18 LAB — COMPREHENSIVE METABOLIC PANEL
ALBUMIN: 4.3 g/dL (ref 3.5–5.0)
ALT: 11 U/L — ABNORMAL LOW (ref 17–63)
ANION GAP: 6 (ref 5–15)
AST: 20 U/L (ref 15–41)
Alkaline Phosphatase: 51 U/L (ref 38–126)
BILIRUBIN TOTAL: 0.5 mg/dL (ref 0.3–1.2)
BUN: 25 mg/dL — ABNORMAL HIGH (ref 6–20)
CO2: 29 mmol/L (ref 22–32)
Calcium: 9.1 mg/dL (ref 8.9–10.3)
Chloride: 104 mmol/L (ref 101–111)
Creatinine, Ser: 1.05 mg/dL (ref 0.61–1.24)
GFR calc Af Amer: >60 mL/min (ref >60)
GLUCOSE: 86 mg/dL (ref 65–99)
POTASSIUM: 4.3 mmol/L (ref 3.5–5.1)
Sodium: 139 mmol/L (ref 135–145)
TOTAL PROTEIN: 6.9 g/dL (ref 6.5–8.1)

## 2015-05-18 LAB — TROPONIN I: Troponin I: <0.03 ng/mL (ref <0.031)

## 2015-05-18 MED ORDER — DIAZEPAM 2 MG PO TABS: 2.0000 mg | ORAL_TABLET | Freq: Three times a day (TID) | ORAL | Status: AC | PRN

## 2015-05-18 MED ORDER — DIAZEPAM 5 MG PO TABS
5.0000 mg | ORAL_TABLET | Freq: Once | ORAL | Status: AC
  Administered 2015-05-18: 5 mg via ORAL
  Filled 2015-05-18: qty 1

## 2015-05-18 NOTE — ED Provider Notes (Signed)
[facility] Emergency Department Provider Note     Time seen: ----------------------------------------- 10:34 AM on 05/18/2015 -----------------------------------------    I have reviewed the triage vital signs and the nursing notes.   HISTORY  Chief Complaint Fall    HPI Anthony Blankenship is a 81 y.o. male who presents ER for an unwitnessed fall that his nursing home. Patient has no pain, just complains of weakness. EMS states the facility has been changing his restless leg medication recently, patient is complaining of being fatigued.   Past Medical History  Diagnosis Date  . Kidney stones   . Hypertension   . Anxiety   . Parkinson's disease (HCC)   . Coronary artery disease   . Restless leg syndrome     Patient Active Problem List   Diagnosis Date Noted  . Restlessness 05/14/2015    Past Surgical History  Procedure Laterality Date  . Appendectomy    . Coronary artery bypass graft      Allergies Review of patient's allergies indicates no known allergies.  Social History Social History  Substance Use Topics  . Smoking status: Never Smoker   . Smokeless tobacco: Never Used  . Alcohol Use: No    Review of Systems Constitutional: Negative for fever. Eyes: Negative for visual changes. ENT: Negative for sore throat. Cardiovascular: Negative for chest pain. Respiratory: Negative for shortness of breath. Gastrointestinal: Negative for abdominal pain, vomiting and diarrhea. Genitourinary: Negative for dysuria. Musculoskeletal: Negative for back pain. Skin: Negative for rash. Neurological: Negative for headaches, positive for weakness  10-point ROS otherwise negative.  ____________________________________________   PHYSICAL EXAM:  VITAL SIGNS: ED Triage Vitals  Enc Vitals Group     BP 05/18/15 0953 181/82 mmHg     Pulse Rate 05/18/15 0953 53     Resp 05/18/15 0953 18     Temp 05/18/15 0953 98.1 F (36.7 C)     Temp  Source 05/18/15 0953 Oral     SpO2 05/18/15 0953 100 %     Weight 05/18/15 0953 153 lb (69.4 kg)     Height 05/18/15 0953 5\' 10"  (1.778 m)     Head Cir --      Peak Flow --      Pain Score --      Pain Loc --      Pain Edu? --      Excl. in GC? --     Constitutional: Alert and oriented. Lethargic but in no distress. Eyes: Conjunctivae are normal. PERRL. Normal extraocular movements. ENT   Head: Normocephalic and atraumatic.   Nose: No congestion/rhinnorhea.   Mouth/Throat: Mucous membranes are moist.   Neck: No stridor. Cardiovascular: Slow rate, regular rhythm. Normal and symmetric distal pulses are present in all extremities. No murmurs, rubs, or gallops. Respiratory: Normal respiratory effort without tachypnea nor retractions. Breath sounds are clear and equal bilaterally. No wheezes/rales/rhonchi. Gastrointestinal: Soft and nontender. No distention. No abdominal bruits.  Musculoskeletal: Limited but nontender range of motion of his extremities. Neurologic:  Normal speech and language. No gross focal neurologic deficits are appreciated.  Skin:  Skin is warm, dry and intact. No rash noted. Psychiatric: Mood and affect are normal.  ____________________________________________  EKG: Interpreted by me. Sinus rhythm with a rate of 53 bpm, right bundle branch block, normal axis. No evidence of acute infarction.  ____________________________________________  ED COURSE:  Pertinent labs & imaging results that were available during my care of the patient were reviewed by me and considered in my  medical decision making (see chart for details). Patient's no acute distress, will check basic labs for weakness and obtained pelvis x-rays. ____________________________________________    LABS (pertinent positives/negatives)  Labs Reviewed  CBC WITH DIFFERENTIAL/PLATELET - Abnormal; Notable for the following:    Platelets 127 (*)    All other components within normal limits   COMPREHENSIVE METABOLIC PANEL - Abnormal; Notable for the following:    BUN 25 (*)    ALT 11 (*)    All other components within normal limits  URINALYSIS COMPLETEWITH MICROSCOPIC ([facility] ONLY) - Abnormal; Notable for the following:    Color, Urine YELLOW (*)    APPearance CLEAR (*)    All other components within normal limits  TROPONIN I    RADIOLOGY  Pelvis x-ray Is unremarkable ____________________________________________  FINAL ASSESSMENT AND PLAN  Fall, weakness, restless leg syndrome  Plan: Patient with labs and imaging as dictated above. Patient be given Valium for anxiolysis and restless leg syndrome. He'll be discharged with a low-dose of same and encouraged to have close follow-up with his doctor. His labs and x-ray here unremarkable.   Emily Filbert, MD   Emily Filbert, MD 05/18/15 [phone]

## 2015-05-18 NOTE — ED Notes (Signed)
[facility]

## 2015-05-18 NOTE — ED Notes (Signed)
Pt cleaned and changed, given chocolate milk

## 2015-05-18 NOTE — ED Notes (Signed)
Patient transported to X-ray 

## 2015-05-18 NOTE — ED Notes (Signed)
pt arrives via [facility] from [facility], pt had unwitnessed fall, states no pain, EMs states facility has been changing his restless leg medication recently, pt states he is tired

## 2015-05-18 NOTE — ED Notes (Signed)
Helped nurse Olivia change and dry patient.

## 2015-05-18 NOTE — ED Notes (Signed)
Lab called concerning blood work being processed, states they will get someone on it

## 2015-05-18 NOTE — ED Notes (Signed)
Pt returned from xray, resting in bed in no distress 

## 2015-05-18 NOTE — Discharge Instructions (Signed)
Restless Legs Syndrome Restless legs syndrome is a condition that causes uncomfortable feelings or sensations in the legs, especially while sitting or lying down. The sensations usually cause an overwhelming urge to move the legs. The arms can also sometimes be affected. The condition can range from mild to severe. The symptoms often interfere with a person's ability to sleep. CAUSES The cause of this condition is not known. RISK FACTORS This condition is more likely to develop in:  People who are older than age 50.  Pregnant women. In general, restless legs syndrome is more common in women than in men.  People who have a family history of the condition.  People who have certain medical conditions, such as iron deficiency, kidney disease, Parkinson disease, or nerve damage.  People who take certain medicines, such as medicines for high blood pressure, nausea, colds, allergies, depression, and some heart conditions. SYMPTOMS The main symptom of this condition is uncomfortable sensations in the legs. These sensations may be:  Described as pulling, tingling, prickling, throbbing, crawling, or burning.  Worse while you are sitting or lying down.  Worse during periods of rest or inactivity.  Worse at night, often interfering with your sleep.  Accompanied by a very strong urge to move your legs.  Temporarily relieved by movement of your legs. The sensations usually affect both sides of the body. The arms can also be affected, but this is rare. People who have this condition often have tiredness during the day because of their lack of sleep at night. DIAGNOSIS This condition may be diagnosed based on your description of the symptoms. You may also have tests, including blood tests, to check for other conditions that may lead to your symptoms. In some cases, you may be asked to spend some time in a sleep lab so your sleeping can be monitored. TREATMENT Treatment for this condition is  focused on managing the symptoms. Treatment may include:  Self-help and lifestyle changes.  Medicines. HOME CARE INSTRUCTIONS  Take medicines only as directed by your health care provider.  Try these methods to get temporary relief from the uncomfortable sensations:  Massage your legs.  Walk or stretch.  Take a cold or hot bath.  Practice good sleep habits. For example, go to bed and get up at the same time every day.  Exercise regularly.  Practice ways of relaxing, such as yoga or meditation.  Avoid caffeine and alcohol.  Do not use any tobacco products, including cigarettes, chewing tobacco, or electronic cigarettes. If you need help quitting, ask your health care provider.  Keep all follow-up visits as directed by your health care provider. This is important. SEEK MEDICAL CARE IF: Your symptoms do not improve with treatment, or they get worse.   This information is not intended to replace advice given to you by your health care provider. Make sure you discuss any questions you have with your health care provider.   Document Released: 06/03/2002 Document Revised: 10/28/2014 Document Reviewed: 06/09/2014 Elsevier Interactive Patient Education 2016 Elsevier Inc.  

## 2015-08-05 ENCOUNTER — Emergency Department
Admission: EM | Admit: 2015-08-05 | Discharge: 2015-08-05 | Disposition: A | Discharge status: Home / Self Care | Payer: Medicare Other | Attending: Emergency Medicine | Admitting: Emergency Medicine

## 2015-08-05 DIAGNOSIS — G20 Parkinson's disease: Secondary | ICD-10-CM | POA: Diagnosis not present

## 2015-08-05 DIAGNOSIS — I10 Essential (primary) hypertension: Secondary | ICD-10-CM | POA: Diagnosis not present

## 2015-08-05 DIAGNOSIS — Z79899 Other long term (current) drug therapy: Secondary | ICD-10-CM | POA: Diagnosis not present

## 2015-08-05 DIAGNOSIS — Z008 Encounter for other general examination: Secondary | ICD-10-CM | POA: Diagnosis not present

## 2015-08-05 DIAGNOSIS — G2581 Restless legs syndrome: Secondary | ICD-10-CM | POA: Diagnosis not present

## 2015-08-05 DIAGNOSIS — F99 Mental disorder, not otherwise specified: Secondary | ICD-10-CM | POA: Diagnosis present

## 2015-08-05 NOTE — ED Notes (Signed)
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

## 2015-08-05 NOTE — Discharge Instructions (Signed)
Restless Legs Syndrome Restless legs syndrome is a condition that causes uncomfortable feelings or sensations in the legs, especially while sitting or lying down. The sensations usually cause an overwhelming urge to move the legs. The arms can also sometimes be affected. The condition can range from mild to severe. The symptoms often interfere with a person's ability to sleep. CAUSES The cause of this condition is not known. RISK FACTORS This condition is more likely to develop in:  People who are older than age 3.  Pregnant women. In general, restless legs syndrome is more common in women than in men.  People who have a family history of the condition.  People who have certain medical conditions, such as iron deficiency, kidney disease, Parkinson disease, or nerve damage.  People who take certain medicines, such as medicines for high blood pressure, nausea, colds, allergies, depression, and some heart conditions. SYMPTOMS The main symptom of this condition is uncomfortable sensations in the legs. These sensations may be:  Described as pulling, tingling, prickling, throbbing, crawling, or burning.  Worse while you are sitting or lying down.  Worse during periods of rest or inactivity.  Worse at night, often interfering with your sleep.  Accompanied by a very strong urge to move your legs.  Temporarily relieved by movement of your legs. The sensations usually affect both sides of the body. The arms can also be affected, but this is rare. People who have this condition often have tiredness during the day because of their lack of sleep at night. DIAGNOSIS This condition may be diagnosed based on your description of the symptoms. You may also have tests, including blood tests, to check for other conditions that may lead to your symptoms. In some cases, you may be asked to spend some time in a sleep lab so your sleeping can be monitored. TREATMENT Treatment for this condition is  focused on managing the symptoms. Treatment may include:  Self-help and lifestyle changes.  Medicines. HOME CARE INSTRUCTIONS  Take medicines only as directed by your health care provider.  Try these methods to get temporary relief from the uncomfortable sensations:  Massage your legs.  Walk or stretch.  Take a cold or hot bath.  Practice good sleep habits. For example, go to bed and get up at the same time every day.  Exercise regularly.  Practice ways of relaxing, such as yoga or meditation.  Avoid caffeine and alcohol.  Do not use any tobacco products, including cigarettes, chewing tobacco, or electronic cigarettes. If you need help quitting, ask your health care provider.  Keep all follow-up visits as directed by your health care provider. This is important. SEEK MEDICAL CARE IF: Your symptoms do not improve with treatment, or they get worse.   This information is not intended to replace advice given to you by your health care provider. Make sure you discuss any questions you have with your health care provider.   Document Released: 06/03/2002 Document Revised: 10/28/2014 Document Reviewed: 06/09/2014 Elsevier Interactive Patient Education [employer]  Please return immediately if condition worsens. Please contact her primary physician or the physician you were given for referral. If you have any specialist physicians involved in her treatment and plan please also contact them. Thank you for using [facility]. Patient denies any suicidal thoughts, homicidal thoughts, or any hallucinations and is awake alert and oriented 3.

## 2015-08-05 NOTE — ED Notes (Signed)

## 2015-08-05 NOTE — ED Provider Notes (Signed)
Time Seen: Approximately 1741  I have reviewed the triage notes  Chief Complaint: Mental Health Problem   History of Present Illness: Anthony Blankenship is a 82 y.o. male who presents from local nursing facility for evaluation of a acute psychiatric illness. The patient has a history of restless leg syndrome, Parkinson's disease, anxiety. Patient apparently was reported to have stated that he had suicidal ideations by his roommate at the facility. He supposedly stated to his roommate that he "" wanted to die "". Patient denies any of this episode and is currently awake alert and oriented 3. 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 He denies any feelings of acute depression. He denies any fever or physical complaints. He states other than his restless leg syndrome is been doing well. He denies any homicidal thoughts or hallucinations.   Past Medical History  Diagnosis Date  . Kidney stones   . Hypertension   . Anxiety   . Parkinson's disease (HCC)   . Coronary artery disease   . Restless leg syndrome     Patient Active Problem List   Diagnosis Date Noted  . Restlessness 05/14/2015    Past Surgical History  Procedure Laterality Date  . Appendectomy    . Coronary artery bypass graft      Past Surgical History  Procedure Laterality Date  . Appendectomy    . Coronary artery bypass graft      Current Outpatient Rx  Name  Route  Sig  Dispense  Refill  . carbidopa-levodopa-entacapone (STALEVO) 50-200-200 MG tablet   Oral   Take 1 tablet by mouth 4 (four) times daily.         . diazepam (VALIUM) 2 MG tablet   Oral   Take 1 tablet (2 mg total) by mouth every 8 (eight) hours as needed for muscle spasms (restless leg, agitation).   30 tablet   0   . levothyroxine (SYNTHROID, LEVOTHROID) 50 MCG tablet   Oral   Take 50 mcg by mouth daily.          Marland Kitchen rOPINIRole (REQUIP) 1 MG tablet   Oral   Take 1 tablet (1 mg total) by  mouth 3 (three) times daily.   90 tablet   0     Allergies:  Review of patient's allergies indicates no known allergies.  Family History: Family History  Problem Relation Age of Onset  . Liver disease Father     Social History: Social History  Substance Use Topics  . Smoking status: Never Smoker   . Smokeless tobacco: Never Used  . Alcohol Use: No     Review of Systems:   10 point review of systems was performed and was otherwise negative:  Constitutional: No fever Eyes: No visual disturbances ENT: No sore throat, ear pain Cardiac: No chest pain Respiratory: No shortness of breath, wheezing, or stridor Abdomen: No abdominal pain, no vomiting, No diarrhea Endocrine: No weight loss, No night sweats Extremities: No peripheral edema, cyanosis Skin: No rashes, easy bruising Neurologic: No focal weakness, trouble with speech or swollowing Urologic: No dysuria, Hematuria, or urinary frequency   Physical Exam:  ED Triage Vitals  Enc Vitals Group     BP 08/05/15 1731 108/61 mmHg     Pulse Rate 08/05/15 1731 58     Resp 08/05/15 1731 13     Temp 08/05/15 1731 98.2 F (36.8 C)     Temp Source 08/05/15 1731  Oral     SpO2 08/05/15 1731 96 %     Weight 08/05/15 1607 158 lb (71.668 kg)     Height 08/05/15 1607  (1.753 m)     Head Cir --      Peak Flow --      Pain Score --      Pain Loc --      Pain Edu? --      Excl. in GC? --     General: Awake , Alert , and Oriented times 3; GCS 15 Head: Normal cephalic , atraumatic Eyes: Pupils equal , round, reactive to light Nose/Throat: No nasal drainage, patent upper airway without erythema or exudate.  Neck: Supple, Full range of motion, No anterior adenopathy or palpable thyroid masses Lungs: Clear to ascultation without wheezes , rhonchi, or rales Heart: Regular rate, regular rhythm without murmurs , gallops , or rubs Abdomen: Soft, non tender without rebound, guarding , or rigidity; bowel sounds positive and  symmetric in all 4 quadrants. No organomegaly .        Extremities: 2 plus symmetric pulses. No edema, clubbing or cyanosis Neurologic: Patient has a resting tremor indicative of his Parkinson's disease. No focal weakness or sensory deficits are noted Skin: warm, dry, no rashes   L ED Course: * I felt patient was stable at this point had no reason to involuntarily commit him or have an immediate psychiatric evaluation. Patient denies any these thoughts and feelings to this historian and has no new physical complaints. The patient was referred back to the facility.    Assessment: * Medical screening exam Restless leg syndrome   Final Clinical Impression:   Final diagnoses:  Restless leg syndrome     Plan: Outpatient management Patient was advised to return immediately if condition worsens. Patient was advised to follow up with their primary care physician or other specialized physicians involved in their outpatient care             Jennye Moccasin, MD 08/05/15 1744

## 2015-08-05 NOTE — ED Notes (Signed)
BEHAVIORAL HEALTH ROUNDING Patient sleeping: No. Patient alert and oriented: yes Behavior appropriate: Yes.  ; If no, describe:  Nutrition and fluids offered: yes Toileting and hygiene offered: Yes  Sitter present: q15 minute observations and security monitoring Law enforcement present: Yes  ODS  Pt observed with no unusual behavior  Appropriate to stimulation  No verbalized needs or concerns at this time  NAD assessed  Continue to monitor  

## 2015-10-22 ENCOUNTER — Emergency Department: Payer: Medicare Other

## 2015-10-22 ENCOUNTER — Encounter: Payer: Self-pay | Admitting: Emergency Medicine

## 2015-10-22 ENCOUNTER — Emergency Department
Admission: EM | Admit: 2015-10-22 | Discharge: 2015-10-22 | Disposition: A | Discharge status: Home / Self Care | Payer: Medicare Other | Attending: Emergency Medicine | Admitting: Emergency Medicine

## 2015-10-22 DIAGNOSIS — I10 Essential (primary) hypertension: Secondary | ICD-10-CM | POA: Insufficient documentation

## 2015-10-22 DIAGNOSIS — Y939 Activity, unspecified: Secondary | ICD-10-CM | POA: Diagnosis not present

## 2015-10-22 DIAGNOSIS — Y999 Unspecified external cause status: Secondary | ICD-10-CM | POA: Diagnosis not present

## 2015-10-22 DIAGNOSIS — I2510 Atherosclerotic heart disease of native coronary artery without angina pectoris: Secondary | ICD-10-CM | POA: Insufficient documentation

## 2015-10-22 DIAGNOSIS — G20 Parkinson's disease: Secondary | ICD-10-CM | POA: Diagnosis not present

## 2015-10-22 DIAGNOSIS — W06XXXA Fall from bed, initial encounter: Secondary | ICD-10-CM | POA: Insufficient documentation

## 2015-10-22 DIAGNOSIS — W19XXXA Unspecified fall, initial encounter: Secondary | ICD-10-CM

## 2015-10-22 DIAGNOSIS — S0990XA Unspecified injury of head, initial encounter: Secondary | ICD-10-CM | POA: Insufficient documentation

## 2015-10-22 DIAGNOSIS — Y929 Unspecified place or not applicable: Secondary | ICD-10-CM | POA: Diagnosis not present

## 2015-10-22 NOTE — ED Notes (Addendum)
Pt presents to ED via EMS from [facility] c/o unwitnessed fall. Found in bathroom by SNF staff. Pt states he slipped and hit his head and R hip. Denies LOC. Denies use of blood thinners. C/o pain to R hip with standing, no pain at rest. No shortening or rotation noted.

## 2015-10-22 NOTE — ED Notes (Signed)
Pt able to stand at side of bed and take a few steps without difficulty with assist.

## 2015-10-22 NOTE — ED Provider Notes (Signed)
Time Seen: Approximately 1840 I have reviewed the triage notes  Chief Complaint: Fall   History of Present Illness: Anthony Blankenship is a 82 y.o. male who presents after a non-syncopal fall from a local assisted living facility. Patient has a known history of Parkinson's disease and restless leg syndrome. He states he got up to go the bathroom and was able to urinate and had turned to make his way back to the bed and fell. He slipped and hit his head and his right hip. He has been able to standto have some right hip discomfort. He denies any loss of consciousness. He denies any neck pain to this historian. He denies any difficulty with urination or his bowels.   Past Medical History  Diagnosis Date  . Kidney stones   . Hypertension   . Anxiety   . Parkinson's disease (HCC)   . Coronary artery disease   . Restless leg syndrome     Patient Active Problem List   Diagnosis Date Noted  . Restlessness 05/14/2015    Past Surgical History  Procedure Laterality Date  . Appendectomy    . Coronary artery bypass graft      Past Surgical History  Procedure Laterality Date  . Appendectomy    . Coronary artery bypass graft      Current Outpatient Rx  Name  Route  Sig  Dispense  Refill  . carbidopa-levodopa-entacapone (STALEVO) 50-200-200 MG tablet   Oral   Take 1 tablet by mouth 4 (four) times daily.         . diazepam (VALIUM) 2 MG tablet   Oral   Take 1 tablet (2 mg total) by mouth every 8 (eight) hours as needed for muscle spasms (restless leg, agitation).   30 tablet   0   . levothyroxine (SYNTHROID, LEVOTHROID) 50 MCG tablet   Oral   Take 50 mcg by mouth daily.          Marland Kitchen rOPINIRole (REQUIP) 1 MG tablet   Oral   Take 1 tablet (1 mg total) by mouth 3 (three) times daily.   90 tablet   0     Allergies:  Review of patient's allergies indicates no known allergies.  Family History: Family History  Problem Relation Age of Onset  . Liver disease Father      Social History: Social History  Substance Use Topics  . Smoking status: Never Smoker   . Smokeless tobacco: Never Used  . Alcohol Use: No     Review of Systems:   10 point review of systems was performed and was otherwise negative:  Constitutional: No fever Eyes: No visual disturbances ENT: No sore throat, ear pain Cardiac: No chest pain Respiratory: No shortness of breath, wheezing, or stridor Abdomen: No abdominal pain, no vomiting, No diarrhea Endocrine: No weight loss, No night sweats Extremities: No peripheral edema, cyanosis Skin: No rashes, easy bruising Neurologic: No focal weakness, trouble with speech or swollowing Urologic: No dysuria, Hematuria, or urinary frequency Patient denies any thoracic painsome mild low back discomfort  Physical Exam:  ED Triage Vitals  Enc Vitals Group     BP 10/22/15 1822 120/96 mmHg     Pulse Rate 10/22/15 1822 49     Resp 10/22/15 1822 14     Temp 10/22/15 1822 97.5 F (36.4 C)     Temp Source 10/22/15 1822 Oral     SpO2 10/22/15 1822 97 %     Weight 10/22/15 1822 170 lb 11.2  oz (77.429 kg)     Height 10/22/15 1822  (1.753 m)     Head Cir --      Peak Flow --      Pain Score 10/22/15 1824 0     Pain Loc --      Pain Edu? --      Excl. in GC? --     General: Awake , Alert , and Oriented times 3; GCS 15 Head: Normal cephalic , atraumatic Eyes: Pupils equal , round, reactive to light Nose/Throat: No nasal drainage, patent upper airway without erythema or exudate.  Neck:Patient has good flexion, extension and rotation without any reproducible pain, crepitus or neuropraxia. Supple, Full range of motion, No anterior adenopathy or palpable thyroid masses Lungs: Clear to ascultation without wheezes , rhonchi, or rales Heart: Regular rate, regular rhythm without murmurs , gallops , or rubs Abdomen: Soft, non tender without rebound, guarding , or rigidity; bowel sounds positive and symmetric in all 4 quadrants. No  organomegaly .        Extremities: Patient has mild pain when sitting upright in the right hip region but there is no shortening of the extremity and no pain with inversion or eversion at the hip. Neurologic:Resting tremor in his left upper extremity . Motor strength is diminished in both lower extremities with him able to lift his legs up off the stretcher though not against any resistance. Sensory appears to be intact. He has 4+ out of 5 motor strength in both upper extremities and seems symmetric Skin: warm, dry, no rashes   Labs:   All laboratory work was reviewed including any pertinent negatives or positives listed below:  Labs Reviewed - No data to display  EKG: ED ECG REPORT I, Jennye Moccasin, the attending physician, personally viewed and interpreted this ECG.  Date: 10/22/2015 EKG Time: 1825 Rate: 53 Rhythm: normal sinus rhythm QRS Axis: normal Intervals: Right bundle-branch block pattern  ST/T Wave abnormalities: normal Conduction Disturbances: none Narrative Interpretation: unremarkable  No acute ischemic changes  Radiology:  CLINICAL DATA: Unwitnessed fall, head trauma, the head was struck during the fall.  EXAM: CT HEAD WITHOUT CONTRAST  TECHNIQUE: Contiguous axial images were obtained from the base of the skull through the vertex without intravenous contrast.  COMPARISON: 05/14/2015  FINDINGS: The brainstem, cerebellum, cerebral peduncles, thalami, basal ganglia, basilar cisterns, and ventricular system appear within normal limits. There is evidence of mild chronic ischemic microvascular white matter disease.  No intracranial hemorrhage, mass lesion, or acute CVA.  Chronic ethmoid and mild chronic sphenoid sinusitis.  There is atherosclerotic calcification of the cavernous carotid arteries bilaterally.  IMPRESSION: 1. No acute intracranial findings. 2. Mild chronic ischemic microvascular white matter disease. 3. Chronic ethmoid and mild chronic  sphenoid sinusitis.   Electronically Signed By: Gaylyn Rong M.D. On: 10/22/2015 19:27          DG HIP UNILAT WITH PELVIS 2-3 VIEWS RIGHT (Final result) Result time: 10/22/15 19:18:25   Final result by Rad Results In Interface (10/22/15 19:18:25)   Narrative:   CLINICAL DATA: Unwitnessed fall today, landing on the right hip. Right hip pain with standing. Low back pain.  EXAM: DG HIP (WITH OR WITHOUT PELVIS) 2-3V RIGHT  COMPARISON: 05/18/2015  FINDINGS: Spurring of both femoral heads. No fracture or acute bony finding identified.  IMPRESSION: 1. Degenerative spurring of both femoral heads, without acute bony findings.   Electronically Signed By: Gaylyn Rong M.D. On: 10/22/2015 19:18  DG Lumbar Spine 2-3 Views (Final result) Result time: 10/22/15 19:20:51   Final result by Rad Results In Interface (10/22/15 19:20:51)   Narrative:   CLINICAL DATA: Unwitnessed fall today, landing on the right hip. Right hip pain with standing. Low back pain.  EXAM: LUMBAR SPINE - 2-3 VIEW  COMPARISON: 05/18/2015  FINDINGS: 16 degrees levoconvex lumbar scoliosis between L4 and T11, with rotary component. 3 mm chronic degenerative anterolisthesis at L4-5 with vacuum disc phenomenon at the L4-5 level.  Multilevel spondylosis with degenerative facet arthropathy most notable at the L4-5 level bilaterally, and also on the right at L2- 3. The facet arthropathy is likely causing foraminal impingement at the L4-5 level.  IMPRESSION: 1. Lumbar spondylosis, scoliosis, and degenerative disc disease, but without acute findings.          I personally reviewed the radiologic studies     ED Course:  Patient was able to stand and bear weight at the bedside and take a step. Given his history of Parkinson's of felt this was most likely ataxia and falls created by his Parkinson's. He states he was not using a walker at the time and clearly  the patient has some weakness and instability.    Assessment: Status post fall with right hip contusion     Plan: * Outpatient management Patient was advised to return immediately if condition worsens. Patient was advised to follow up with their primary care physician or other specialized physicians involved in their outpatient care. The patient and/or family member/power of attorney had laboratory results reviewed at the bedside. All questions and concerns were addressed and appropriate discharge instructions were distributed by the nursing staff. Patient be transported back via BLS unit            Jennye MoccasinBrian S Quigley, MD 10/22/15 2003

## 2015-10-22 NOTE — Discharge Instructions (Signed)
Fall Prevention in Hospitals, Adult As a hospital patient, your condition and the treatments you receive can increase your risk for falls. Some additional risk factors for falls in a hospital include:  Being in an unfamiliar environment.  Being on bed rest.  Your surgery.  Taking certain medicines.  Your tubing requirements, such as intravenous (IV) therapy or catheters. It is important that you learn how to decrease fall risks while at the hospital. Below are important tips that can help prevent falls. SAFETY TIPS FOR PREVENTING FALLS Talk about your risk of falling.  Ask your health care provider why you are at risk for falling. Is it your medicine, illness, tubing placement, or something else?  Make a plan with your health care provider to keep you safe from falls.  Ask your health care provider or pharmacist about side effects of your medicines. Some medicines can make you dizzy or affect your coordination. Ask for help.  Ask for help before getting out of bed. You may need to press your call button.  Ask for assistance in getting safely to the toilet.  Ask for a walker or cane to be put at your bedside. Ask that most of the side rails on your bed be placed up before your health care provider leaves the room.  Ask family or friends to sit with you.  Ask for things that are out of your reach, such as your glasses, hearing aids, telephone, bedside table, or call button. Follow these tips to avoid falling:  Stay lying or seated, rather than standing, while waiting for help.  Wear rubber-soled slippers or shoes whenever you walk in the hospital.  Avoid quick, sudden movements.  Change positions slowly.  Sit on the side of your bed before standing.  Stand up slowly and wait before you start to walk.  Let your health care provider know if there is a spill on the floor.  Pay careful attention to the medical equipment, electrical cords, and tubes around you.  When you  need help, use your call button by your bed or in the bathroom. Wait for one of your health care providers to help you.  If you feel dizzy or unsure of your footing, return to bed and wait for assistance.  Avoid being distracted by the TV, telephone, or another person in your room.  Do not lean or support yourself on rolling objects, such as IV poles or bedside tables.   This information is not intended to replace advice given to you by your health care provider. Make sure you discuss any questions you have with your health care provider.   Document Released: 06/10/2000 Document Revised: 07/04/2014 Document Reviewed: 02/19/2012 Elsevier Interactive Patient Education [employer]2016 [employer]  Please return immediately if condition worsens. Please contact her primary physician or the physician you were given for referral. If you have any specialist physicians involved in her treatment and plan please also contact them. Thank you for using [facility] [place] emergency Department. Patient does not appear to have suffered any significant injury from a non-syncopal fall. His head CT and x-rays of his hip and lumbar spine show no acute injuries.

## 2015-10-22 NOTE — ED Notes (Signed)
Pt provided apple sauce and chocolate milk.

## 2015-10-27 ENCOUNTER — Encounter: Payer: Self-pay | Admitting: *Deleted

## 2015-10-27 ENCOUNTER — Emergency Department: Payer: Medicare Other

## 2015-10-27 ENCOUNTER — Emergency Department
Admission: EM | Admit: 2015-10-27 | Discharge: 2015-10-27 | Disposition: A | Discharge status: Home / Self Care | Payer: Medicare Other | Attending: Emergency Medicine | Admitting: Emergency Medicine

## 2015-10-27 DIAGNOSIS — M25551 Pain in right hip: Secondary | ICD-10-CM | POA: Diagnosis present

## 2015-10-27 DIAGNOSIS — Z951 Presence of aortocoronary bypass graft: Secondary | ICD-10-CM | POA: Insufficient documentation

## 2015-10-27 DIAGNOSIS — I10 Essential (primary) hypertension: Secondary | ICD-10-CM | POA: Insufficient documentation

## 2015-10-27 DIAGNOSIS — I2510 Atherosclerotic heart disease of native coronary artery without angina pectoris: Secondary | ICD-10-CM | POA: Diagnosis not present

## 2015-10-27 DIAGNOSIS — W19XXXA Unspecified fall, initial encounter: Secondary | ICD-10-CM

## 2015-10-27 DIAGNOSIS — G20 Parkinson's disease: Secondary | ICD-10-CM | POA: Diagnosis not present

## 2015-10-27 DIAGNOSIS — Z79899 Other long term (current) drug therapy: Secondary | ICD-10-CM | POA: Diagnosis not present

## 2015-10-27 MED ORDER — ACETAMINOPHEN 325 MG PO TABS
650.0000 mg | ORAL_TABLET | Freq: Once | ORAL | Status: AC
  Administered 2015-10-27: 650 mg via ORAL
  Filled 2015-10-27: qty 2

## 2015-10-27 NOTE — ED Provider Notes (Signed)
[facility] Emergency Department Provider Note   ____________________________________________  Time seen: Seen upon arrival to the emergency department  I have reviewed the triage vital signs and the nursing notes.   HISTORY  Chief Complaint Fall and Hip Pain   HPI Anthony Blankenship is a 82 y.o. male with a history of Parkinson's disease and frequent falls was presenting to the emergency department today with right hip pain. He says that he was walking with his right hand on his wheelchair in his left hand on his cane and he fell. He is unclear of exactly why he fell but says he has had multiple recent falls. He was seen here in the emergency department on April 27 and had imaging that was negative for any acute findings. He denies hitting his head today or losing consciousness. He is not on any blood thinning medications.He says that he fell on his right hip. Medic said that he was able to get himself up and then was sent in by his assisted living staff.   Past Medical History  Diagnosis Date  . Kidney stones   . Hypertension   . Anxiety   . Parkinson's disease (HCC)   . Coronary artery disease   . Restless leg syndrome     Patient Active Problem List   Diagnosis Date Noted  . Restlessness 05/14/2015    Past Surgical History  Procedure Laterality Date  . Appendectomy    . Coronary artery bypass graft      Current Outpatient Rx  Name  Route  Sig  Dispense  Refill  . carbidopa-levodopa-entacapone (STALEVO) 50-200-200 MG tablet   Oral   Take 1 tablet by mouth 4 (four) times daily.         . diazepam (VALIUM) 2 MG tablet   Oral   Take 1 tablet (2 mg total) by mouth every 8 (eight) hours as needed for muscle spasms (restless leg, agitation).   30 tablet   0   . levothyroxine (SYNTHROID, LEVOTHROID) 50 MCG tablet   Oral   Take 50 mcg by mouth daily.          Marland Kitchen. rOPINIRole (REQUIP) 1 MG tablet   Oral   Take 1 tablet (1 mg total) by  mouth 3 (three) times daily.   90 tablet   0     Allergies Review of patient's allergies indicates no known allergies.  Family History  Problem Relation Age of Onset  . Liver disease Father     Social History Social History  Substance Use Topics  . Smoking status: Never Smoker   . Smokeless tobacco: Never Used  . Alcohol Use: No    Review of Systems Constitutional: No fever/chills Eyes: No visual changes. ENT: No sore throat. Cardiovascular: Denies chest pain. Respiratory: Denies shortness of breath. Gastrointestinal: No abdominal pain.  No nausea, no vomiting.  No diarrhea.  No constipation. Genitourinary: Negative for dysuria. Musculoskeletal: Negative for back pain At this time although the patient is known to have chronic back pain. Skin: Negative for rash. Neurological: Negative for headaches or numbness. Patient with baseline weakness to the bilateral lower extremities.  10-point ROS otherwise negative.  ____________________________________________   PHYSICAL EXAM:  VITAL SIGNS: ED Triage Vitals  Enc Vitals Group     BP 10/27/15 1533 175/88 mmHg     Pulse Rate 10/27/15 1533 65     Resp 10/27/15 1533 20     Temp 10/27/15 1533 97.6 F (36.4 C)  Temp Source 10/27/15 1533 Oral     SpO2 10/27/15 1533 100 %     Weight 10/27/15 1533 170 lb (77.111 kg)     Height 10/27/15 1533  (1.854 m)     Head Cir --      Peak Flow --      Pain Score --      Pain Loc --      Pain Edu? --      Excl. in GC? --     Constitutional: Alert and oriented. Well appearing and in no acute distress. Eyes: Conjunctivae are normal. PERRL. EOMI. Head: Atraumatic. Nose: No congestion/rhinnorhea. Mouth/Throat: Mucous membranes are moist.   Neck: No stridor.  Her tenderness palpation to the midline. No deformity or step-off. Cardiovascular: Normal rate, regular rhythm. Grossly normal heart sounds.  Good peripheral circulation. Respiratory: Normal respiratory effort.  No  retractions. Lungs CTAB. Gastrointestinal: Soft and nontender. No distention. No CVA tenderness. Musculoskeletal: No lower extremity tenderness nor edema.  No joint effusions. No tenderness along the thoracic or lumbar spines. No deformity or step-off. No tenderness to the hips bilaterally. Able to fully passively range the bilateral hips without any resistance or outward signs of pain. Neurologic:  Normal speech and language. 4 out of 5 strength to the bilateral extremities which appears to be baseline. Skin:  Skin is warm, dry and intact. No rash noted. Psychiatric: Mood and affect are normal. Speech and behavior are normal.  ____________________________________________   LABS (all labs ordered are listed, but only abnormal results are displayed)  Labs Reviewed - No data to display ____________________________________________  EKG   ____________________________________________  RADIOLOGY   DG HIP UNILAT WITH PELVIS 2-3 VIEWS RIGHT (Final result) Result time: 10/27/15 16:29:13   Final result by Rad Results In Interface (10/27/15 16:29:13)   Narrative:   CLINICAL DATA: Pt brought in via ems from the [facility]. Pt had unwitnessed fall. Pt has right hip/leg pain. No deformity noted. Pt has tremors. Hx parkinsons disease. Pt alert . md at bedside. siderails up x 2.  EXAM: DG HIP (WITH OR WITHOUT PELVIS) 2-3V RIGHT  COMPARISON: None.  FINDINGS: Hips are located. No evidence of pelvic fracture or sacral fracture. Dedicated view of the RIGHT hip demonstrates no femoral neck fracture.  IMPRESSION: No fracture or dislocation.   Electronically Signed By: Genevive Bi M.D. On: 10/27/2015 16:29       ____________________________________________   PROCEDURES    ____________________________________________   INITIAL IMPRESSION / ASSESSMENT AND PLAN / ED COURSE  Pertinent labs & imaging results that were available during my care of the patient were  reviewed by me and considered in my medical decision making (see chart for details).  ----------------------------------------- 4 PM on 10/27/2015 -----------------------------------------  Discussed the case with the patient's daughter and power of attorney, Ms. [name], who agrees with imaging workup. Will not pursue further workup at this time. Appears to be mechanical fall. Patient denies any chest pain, shortness of breath. Baseline level weakness with frequent mechanical falls.  ----------------------------------------- 5:23 PM on 10/27/2015 -----------------------------------------  Patient continues to rest comfortably. I updated him about his x-ray results. I also discussed the results with the daughter. She is concerned the patient continues to fall at his assisted living. We discussed possible need for an increased level of care. She said she will be discussing this with both his assisted living and the patient. The patient and the daughter did not express any desire for admission at this time. I agree with  planning ahead for the patient's higher level of care because of his frequent falls. ____________________________________________   FINAL CLINICAL IMPRESSION(S) / ED DIAGNOSES  Mechanical fall. Right hip pain.    NEW MEDICATIONS STARTED DURING THIS VISIT:  New Prescriptions   No medications on file     Note:  This document was prepared using Dragon voice recognition software and may include unintentional dictation errors.    Myrna Blazer, MD 10/27/15 [phone]

## 2015-10-27 NOTE — ED Notes (Signed)
Pt brought in via ems from the [facility].  Pt had unwitnessed fall.  Pt has right hip pain.  No shortening or right leg.  Goo rom of right leg.  md at bedside.

## 2015-10-27 NOTE — ED Notes (Signed)
Pt brought in via ems from the [facility].  Pt had unwitnessed fall.  Pt has right hip/leg pain.  No deformity noted.  Pt has tremors.  Hx parkinsons disease.  Pt alert .  md at bedside.  siderails up x 2.

## 2015-10-27 NOTE — ED Notes (Signed)
Pt waiting on [facility] transport [facility].   Pt alert.

## 2016-03-17 ENCOUNTER — Emergency Department
Admission: EM | Admit: 2016-03-17 | Discharge: 2016-03-17 | Disposition: A | Discharge status: Home / Self Care | Payer: Medicare Other | Attending: Emergency Medicine | Admitting: Emergency Medicine

## 2016-03-17 ENCOUNTER — Emergency Department: Payer: Medicare Other

## 2016-03-17 ENCOUNTER — Encounter: Payer: Self-pay | Admitting: Emergency Medicine

## 2016-03-17 DIAGNOSIS — G20 Parkinson's disease: Secondary | ICD-10-CM | POA: Diagnosis not present

## 2016-03-17 DIAGNOSIS — S61011A Laceration without foreign body of right thumb without damage to nail, initial encounter: Secondary | ICD-10-CM | POA: Insufficient documentation

## 2016-03-17 DIAGNOSIS — S61012A Laceration without foreign body of left thumb without damage to nail, initial encounter: Secondary | ICD-10-CM | POA: Insufficient documentation

## 2016-03-17 DIAGNOSIS — S0990XA Unspecified injury of head, initial encounter: Secondary | ICD-10-CM

## 2016-03-17 DIAGNOSIS — S61219A Laceration without foreign body of unspecified finger without damage to nail, initial encounter: Secondary | ICD-10-CM

## 2016-03-17 DIAGNOSIS — W0110XA Fall on same level from slipping, tripping and stumbling with subsequent striking against unspecified object, initial encounter: Secondary | ICD-10-CM | POA: Insufficient documentation

## 2016-03-17 DIAGNOSIS — Y939 Activity, unspecified: Secondary | ICD-10-CM | POA: Insufficient documentation

## 2016-03-17 DIAGNOSIS — Y92002 Bathroom of unspecified non-institutional (private) residence single-family (private) house as the place of occurrence of the external cause: Secondary | ICD-10-CM | POA: Insufficient documentation

## 2016-03-17 DIAGNOSIS — I2510 Atherosclerotic heart disease of native coronary artery without angina pectoris: Secondary | ICD-10-CM | POA: Insufficient documentation

## 2016-03-17 DIAGNOSIS — Y999 Unspecified external cause status: Secondary | ICD-10-CM | POA: Insufficient documentation

## 2016-03-17 DIAGNOSIS — Z79899 Other long term (current) drug therapy: Secondary | ICD-10-CM | POA: Insufficient documentation

## 2016-03-17 DIAGNOSIS — S0083XA Contusion of other part of head, initial encounter: Secondary | ICD-10-CM | POA: Insufficient documentation

## 2016-03-17 DIAGNOSIS — I10 Essential (primary) hypertension: Secondary | ICD-10-CM | POA: Insufficient documentation

## 2016-03-17 DIAGNOSIS — W19XXXA Unspecified fall, initial encounter: Secondary | ICD-10-CM

## 2016-03-17 NOTE — ED Notes (Signed)
RN spoke with [facility] reported pt sustained a laceration to hand , derma bond applied by MD, pt will be transported back, staff stated to arrange EMS transport

## 2016-03-17 NOTE — ED Provider Notes (Signed)
[facility] Emergency Department Provider Note   ____________________________________________    I have reviewed the triage vital signs and the nursing notes.   HISTORY  Chief Complaint Fall and Laceration     HPI Anthony Blankenship is a 82 y.o. male who presents after a mechanical fall.Patient reports he slipped on a recently mopped floor. He struck the left side of his head. He denies LOC. He is not on blood thinners. He suffered lacerations to the left hand and right hand. Denies other injuries. He denies neck pain to me. No dizziness no chest pain or shortness of breath.   Past Medical History:  Diagnosis Date  . Anxiety   . Coronary artery disease   . Depression   . Hypertension   . Kidney stones   . Parkinson's disease (HCC)   . Restless leg syndrome     Patient Active Problem List   Diagnosis Date Noted  . Restlessness 05/14/2015    Past Surgical History:  Procedure Laterality Date  . APPENDECTOMY    . CORONARY ARTERY BYPASS GRAFT      Prior to Admission medications   Medication Sig Start Date End Date Taking? Authorizing Provider  carbidopa-levodopa-entacapone (STALEVO) 50-200-200 MG tablet Take 1 tablet by mouth 4 (four) times daily.    Historical Provider, MD  diazepam (VALIUM) 2 MG tablet Take 1 tablet (2 mg total) by mouth every 8 (eight) hours as needed for muscle spasms (restless leg, agitation). 05/18/15 05/17/16  Emily FilbertJonathan E Williams, MD  levothyroxine (SYNTHROID, LEVOTHROID) 50 MCG tablet Take 50 mcg by mouth daily.     Historical Provider, MD  rOPINIRole (REQUIP) 1 MG tablet Take 1 tablet (1 mg total) by mouth 3 (three) times daily. 05/15/15   Enedina FinnerSona Patel, MD     Allergies Review of patient's allergies indicates no known allergies.  Family History  Problem Relation Age of Onset  . Liver disease Father     Social History Social History  Substance Use Topics  . Smoking status: Never Smoker  . Smokeless tobacco:  Never Used  . Alcohol use No    Review of Systems  Constitutional: No Dizziness Eyes: No visual changes.  ENT: No neck pain Cardiovascular: Denies chest pain. Respiratory: Denies shortness of breath. Gastrointestinal: No abdominal pain.  No nausea, no vomiting.    Musculoskeletal: Negative for back pain. Negative for hip pain or pelvis pain Skin: Laceration as above Neurological: Negative for  focal weakness  10-point ROS otherwise negative.  ____________________________________________   PHYSICAL EXAM:  VITAL SIGNS: ED Triage Vitals [03/17/16 0552]  Enc Vitals Group     BP (!) 148/80     Pulse Rate 67     Resp 18     Temp 97.9 F (36.6 C)     Temp Source Oral     SpO2 98 %     Weight 152 lb (68.9 kg)     Height 5\' 9"  (1.753 m)     Head Circumference      Peak Flow      Pain Score      Pain Loc      Pain Edu?      Excl. in GC?     Constitutional: Alert. No acute distress.  Eyes: Conjunctivae are normal.  Head: Hematoma to the left forehead, mild abrasion overlying, nonbleeding, no bony abnormalities Nose: No epistaxis Mouth/Throat: Mucous membranes are moist.   Neck:  Painless ROM, no vertebral tenderness to palpation Cardiovascular: Normal  rate, regular rhythm.  Good peripheral circulation. Respiratory: Normal respiratory effort.  No retractions.  Gastrointestinal: Soft and nontender. No distention.  No CVA tenderness. Genitourinary: deferred Musculoskeletal: No lower extremity tenderness. Full range of motion of upper extremities without pain. Full range of motion of both hips without pain. No pain with axial load on both hips. No tenderness to palpation of the pelvis..  Warm and well perfused extremities. Neurologic:  Normal speech and language. No gross focal neurologic deficits are appreciated.  Skin:  Skin is warm, dry. 2 cm skin tear on the palmar aspect of the right hand between the thumb and first finger, nonbleeding, normal tendon function. Left  palmar thumb: 2 cm shallow laceration vertically on the pad of the thumb, mild bleeding, to cm shallow laceration horizontally of the proximal left thumb   ____________________________________________   LABS (all labs ordered are listed, but only abnormal results are displayed)  Labs Reviewed - No data to display ____________________________________________  EKG  None ____________________________________________  RADIOLOGY  CT scan does not show acute injury ____________________________________________   PROCEDURES  Procedure(s) performed: yes  LACERATION REPAIR Performed by: Jene Every Authorized by: Jene Every Consent: Verbal consent obtained. Risks and benefits: risks, benefits and alternatives were discussed Consent given by: patient Patient identity confirmed: provided demographic data Prepped and Draped in normal sterile fashion Wound explored  Laceration Location: left thumb  Laceration Length: 2 cm  No Foreign Bodies seen or palpated   Irrigation method: syringe Amount of cleaning: standard  Skin closure:dermabond, steri-strips    Patient tolerance: Patient tolerated the procedure well with no immediate complications.     Critical Care performed: No ____________________________________________   INITIAL IMPRESSION / ASSESSMENT AND PLAN / ED COURSE  Pertinent labs & imaging results that were available during my care of the patient were reviewed by me and considered in my medical decision making (see chart for details).  Patient presents after mechanical fall. Suffered a head injury although CT scans are reassuring. Shallow lacerations on left thumb repaired with skin glue and Steri-Strips, Band-Aid applied to skin tear. No other injuries noted on thorough exam.  Clinical Course   ____________________________________________   FINAL CLINICAL IMPRESSION(S) / ED DIAGNOSES  Final diagnoses:  Fall  Head injury, initial encounter    Laceration of finger, initial encounter      NEW MEDICATIONS STARTED DURING THIS VISIT:  New Prescriptions   No medications on file     Note:  This document was prepared using Dragon voice recognition software and may include unintentional dictation errors.    Jene Every, MD 03/17/16 [phone]

## 2016-03-17 NOTE — ED Triage Notes (Signed)
Pt presents to ED from the [facility] after he fell on a freshly mopped floor in the bathroom. Pt has laceration to his left thumb and a hematoma and abrasion to the left side of his head. Hx of recent falls; pt states they have been mopping the floor early in the morning and he has been falling due to the slippery floors. Pt hit his head on the wheel of his wheelchair. Pt states he has not been able to get much sleep lately because his roommate leave the tv on all night with the volume too loud. Ambulates with a cane.

## 2016-04-21 ENCOUNTER — Emergency Department
Admission: EM | Admit: 2016-04-21 | Discharge: 2016-04-21 | Disposition: A | Discharge status: Home / Self Care | Payer: Medicare Other | Attending: Emergency Medicine | Admitting: Emergency Medicine

## 2016-04-21 ENCOUNTER — Emergency Department: Payer: Medicare Other

## 2016-04-21 ENCOUNTER — Encounter: Payer: Self-pay | Admitting: Emergency Medicine

## 2016-04-21 DIAGNOSIS — N4889 Other specified disorders of penis: Secondary | ICD-10-CM | POA: Diagnosis not present

## 2016-04-21 DIAGNOSIS — K6289 Other specified diseases of anus and rectum: Secondary | ICD-10-CM | POA: Diagnosis not present

## 2016-04-21 DIAGNOSIS — I10 Essential (primary) hypertension: Secondary | ICD-10-CM | POA: Insufficient documentation

## 2016-04-21 DIAGNOSIS — Z79899 Other long term (current) drug therapy: Secondary | ICD-10-CM | POA: Insufficient documentation

## 2016-04-21 DIAGNOSIS — I2510 Atherosclerotic heart disease of native coronary artery without angina pectoris: Secondary | ICD-10-CM | POA: Insufficient documentation

## 2016-04-21 DIAGNOSIS — R1030 Lower abdominal pain, unspecified: Secondary | ICD-10-CM | POA: Diagnosis not present

## 2016-04-21 DIAGNOSIS — G20 Parkinson's disease: Secondary | ICD-10-CM | POA: Insufficient documentation

## 2016-04-21 DIAGNOSIS — R339 Retention of urine, unspecified: Secondary | ICD-10-CM | POA: Diagnosis present

## 2016-04-21 DIAGNOSIS — Z951 Presence of aortocoronary bypass graft: Secondary | ICD-10-CM | POA: Insufficient documentation

## 2016-04-21 LAB — CBC WITH DIFFERENTIAL/PLATELET
BASOS ABS: 0.1 10*3/uL (ref 0–0.1)
Basophils Relative: 1 %
EOS ABS: 0.1 10*3/uL (ref 0–0.7)
EOS PCT: 1 %
HCT: 39.3 % — ABNORMAL LOW (ref 40.0–52.0)
HEMOGLOBIN: 13.5 g/dL (ref 13.0–18.0)
LYMPHS ABS: 1.6 10*3/uL (ref 1.0–3.6)
LYMPHS PCT: 12 %
MCH: 31.8 pg (ref 26.0–34.0)
MCHC: 34.2 g/dL (ref 32.0–36.0)
MCV: 93 fL (ref 80.0–100.0)
Monocytes Absolute: 0.7 10*3/uL (ref 0.2–1.0)
Monocytes Relative: 6 %
NEUTROS PCT: 80 %
Neutro Abs: 10.1 10*3/uL — ABNORMAL HIGH (ref 1.4–6.5)
PLATELETS: 109 10*3/uL — AB (ref 150–440)
RBC: 4.23 MIL/uL — AB (ref 4.40–5.90)
RDW: 13.1 % (ref 11.5–14.5)
WBC: 12.5 10*3/uL — AB (ref 3.8–10.6)

## 2016-04-21 LAB — TROPONIN I
TROPONIN I: 0.04 ng/mL — AB (ref <0.03)
TROPONIN I: 0.03 ng/mL — AB (ref <0.03)

## 2016-04-21 LAB — URINALYSIS COMPLETE WITH MICROSCOPIC (ARMC ONLY)
BACTERIA UA: NONE SEEN
Bilirubin Urine: NEGATIVE
GLUCOSE, UA: NEGATIVE mg/dL
LEUKOCYTES UA: NEGATIVE
NITRITE: NEGATIVE
Protein, ur: 30 mg/dL — AB
SPECIFIC GRAVITY, URINE: 1.024 (ref 1.005–1.030)
Squamous Epithelial / LPF: NONE SEEN
pH: 5 (ref 5.0–8.0)

## 2016-04-21 LAB — COMPREHENSIVE METABOLIC PANEL
ALK PHOS: 54 U/L (ref 38–126)
ALT: 7 U/L — AB (ref 17–63)
AST: 29 U/L (ref 15–41)
Albumin: 4.4 g/dL (ref 3.5–5.0)
Anion gap: 6 (ref 5–15)
BUN: 42 mg/dL — AB (ref 6–20)
CALCIUM: 9.2 mg/dL (ref 8.9–10.3)
CHLORIDE: 101 mmol/L (ref 101–111)
CO2: 29 mmol/L (ref 22–32)
CREATININE: 1.29 mg/dL — AB (ref 0.61–1.24)
GFR calc Af Amer: 55 mL/min — ABNORMAL LOW (ref >60)
GFR calc non Af Amer: 47 mL/min — ABNORMAL LOW (ref >60)
GLUCOSE: 97 mg/dL (ref 65–99)
Potassium: 4.4 mmol/L (ref 3.5–5.1)
SODIUM: 136 mmol/L (ref 135–145)
Total Bilirubin: 1.5 mg/dL — ABNORMAL HIGH (ref 0.3–1.2)
Total Protein: 7.2 g/dL (ref 6.5–8.1)

## 2016-04-21 LAB — LIPASE, BLOOD: Lipase: 17 U/L (ref 11–51)

## 2016-04-21 MED ORDER — CEPHALEXIN 250 MG PO CAPS: 250.0000 mg | ORAL_CAPSULE | Freq: Four times a day (QID) | ORAL | 0 refills | Status: AC

## 2016-04-21 NOTE — ED Triage Notes (Addendum)
Pt arrived via EMS from the [facility] with c/o epigastric pain, urinary retention, penis pain and rectum pain. Pt A&O x2, person & place. Hx of Parkinson.  Pt sts he has not voided in 3 days.

## 2016-04-21 NOTE — ED Notes (Signed)
Report given to Baxter HireKristen, Charity fundraiserN at [facility]. RN verbalized understanding of d/c instructions, rx instructions, and f/u care.

## 2016-04-21 NOTE — ED Notes (Signed)
Bladder scan 999+

## 2016-04-21 NOTE — ED Provider Notes (Signed)
[facility] Emergency Department Provider Note   First MD Initiated Contact with Patient 04/21/16 [phone]     (approximate)  I have reviewed the triage vital signs and the nursing notes.   HISTORY  Chief Complaint Urinary Retention  HPI Anthony Blankenship is a 82 y.o. male with history of coronary artery disease Parkinson's disease hypertension presents to the emergency department via EMS. Per EMS initial call was for chest pain however on their arrival the patient complained of pain in his penis and anus. Patient denies chest pain at this time however does admit to penile and anal pain. Patient states that he has not been able to urinate for 3 days.   Past Medical History:  Diagnosis Date  . Anxiety   . Coronary artery disease   . Depression   . Hypertension   . Kidney stones   . Parkinson's disease (HCC)   . Restless leg syndrome     Patient Active Problem List   Diagnosis Date Noted  . Restlessness 05/14/2015    Past Surgical History:  Procedure Laterality Date  . APPENDECTOMY    . CORONARY ARTERY BYPASS GRAFT      Prior to Admission medications   Medication Sig Start Date End Date Taking? Authorizing Provider  carbidopa-levodopa-entacapone (STALEVO) 50-200-200 MG tablet Take 1 tablet by mouth 4 (four) times daily.    Historical Provider, MD  diazepam (VALIUM) 2 MG tablet Take 1 tablet (2 mg total) by mouth every 8 (eight) hours as needed for muscle spasms (restless leg, agitation). 05/18/15 05/17/16  Emily FilbertJonathan E Williams, MD  levothyroxine (SYNTHROID, LEVOTHROID) 50 MCG tablet Take 50 mcg by mouth daily.     Historical Provider, MD  rOPINIRole (REQUIP) 1 MG tablet Take 1 tablet (1 mg total) by mouth 3 (three) times daily. 05/15/15   Enedina FinnerSona Patel, MD    Allergies No known drug allergies  Family History  Problem Relation Age of Onset  . Liver disease Father     Social History Social History  Substance Use Topics  . Smoking status: Never  Smoker  . Smokeless tobacco: Never Used  . Alcohol use No    Review of Systems Constitutional: No fever/chills Eyes: No visual changes. ENT: No sore throat. Cardiovascular: Denies chest pain. Respiratory: Denies shortness of breath. Gastrointestinal: No abdominal pain.  No nausea, no vomiting.  No diarrhea.  No constipation. Genitourinary: Negative for dysuria. Musculoskeletal: Negative for back pain. Skin: Negative for rash. Neurological: Negative for headaches, focal weakness or numbness.  10-point ROS otherwise negative.  ____________________________________________   PHYSICAL EXAM:  VITAL SIGNS: ED Triage Vitals  Enc Vitals Group     BP 04/21/16 0600 (!) 154/72     Pulse Rate 04/21/16 0549 65     Resp 04/21/16 0549 16     Temp 04/21/16 0549 98.3 F (36.8 C)     Temp Source 04/21/16 0549 Oral     SpO2 04/21/16 0545 98 %     Weight 04/21/16 0550 153 lb (69.4 kg)     Height 04/21/16 0550 5\' 3"  (1.6 m)     Head Circumference --      Peak Flow --      Pain Score --      Pain Loc --      Pain Edu? --      Excl. in GC? --     Constitutional: Alert  Well appearing and in no acute distress. Eyes: Conjunctivae are normal. PERRL. EOMI. Head: Atraumatic.  Ears:  Healthy appearing ear canals and TMs bilaterally Nose: No congestion/rhinnorhea. Mouth/Throat: Mucous membranes are moist.  Oropharynx non-erythematous. Cardiovascular: Normal rate, regular rhythm. Good peripheral circulation. Grossly normal heart sounds. Respiratory: Normal respiratory effort.  No retractions. Lungs CTAB. Gastrointestinal: Soft and nontender. No distention.  Genitourinary: Pain with suprapubic palpation Musculoskeletal: No lower extremity tenderness nor edema. No gross deformities of extremities. Neurologic:  Normal speech and language. No gross focal neurologic deficits are appreciated.  Skin:  Skin is warm, dry and intact. No rash noted. Psychiatric: Mood and affect are normal. Speech and  behavior are normal.  ____________________________________________   LABS (all labs ordered are listed, but only abnormal results are displayed)  Labs Reviewed  CBC WITH DIFFERENTIAL/PLATELET - Abnormal; Notable for the following:       Result Value   WBC 12.5 (*)    RBC 4.23 (*)    HCT 39.3 (*)    Platelets 109 (*)    Neutro Abs 10.1 (*)    All other components within normal limits  COMPREHENSIVE METABOLIC PANEL - Abnormal; Notable for the following:    BUN 42 (*)    Creatinine, Ser 1.29 (*)    ALT 7 (*)    Total Bilirubin 1.5 (*)    GFR calc non Af Amer 47 (*)    GFR calc Af Amer 55 (*)    All other components within normal limits  TROPONIN I - Abnormal; Notable for the following:    Troponin I 0.04 (*)    All other components within normal limits  LIPASE, BLOOD   ____________________________________________  EKG  ED ECG REPORT I, [place] BROWN, the attending physician, personally viewed and interpreted this ECG.   Date: 04/21/2016  EKG Time: 5:46AM  Rate: 65  Rhythm: sinus tachycardia  Axis: Normal   Intervals:Normal  ST&T Change: None  ____________________________________________  RADIOLOGY I, [place] Dewayne Shorter, personally viewed and evaluated these images (plain radiographs) as part of my medical decision making, as well as reviewing the written report by the radiologist.  Dg Chest Port 1 View  Result Date: 04/21/2016 CLINICAL DATA:  Epigastric pain, penile and rectal pain for 2-3 days. History of hypertension. EXAM: PORTABLE CHEST 1 VIEW COMPARISON:  Chest radiograph October 18, 2014 FINDINGS: Cardiomediastinal silhouette is normal. Calcified aortic knob. Status post median sternotomy for CABG. No pleural effusions or focal consolidations. Trachea projects midline and there is no pneumothorax. Soft tissue planes and included osseous structures are non-suspicious. Osteopenia. IMPRESSION: No acute cardiopulmonary process. Electronically Signed   By: Awilda Metro M.D.   On: 04/21/2016 06:07     Procedures     INITIAL IMPRESSION / ASSESSMENT AND PLAN / ED COURSE  Pertinent labs & imaging results that were available during my care of the patient were reviewed by me and considered in my medical decision making (see chart for details).  Full catheter inserted with greater than 1000 mL of urine acquired. Initial troponin 0.04 however patient has no chest pain or shortness of breath dizziness nausea or diaphoresis. EKG revealed no evidence of ST segment elevation or depression. Plan to obtain a second troponin. Patient's care transferred to Dr. Mayford Knife   Clinical Course    ____________________________________________  FINAL CLINICAL IMPRESSION(S) / ED DIAGNOSES  Final diagnoses:  Urinary retention     MEDICATIONS GIVEN DURING THIS VISIT:  Medications - No data to display   NEW OUTPATIENT MEDICATIONS STARTED DURING THIS VISIT:  New Prescriptions   No medications on file  Modified Medications   No medications on file    Discontinued Medications   No medications on file     Note:  This document was prepared using Dragon voice recognition software and may include unintentional dictation errors.    Darci Current, MD 04/21/16 [phone]

## 2016-04-21 NOTE — ED Notes (Signed)
Pt's daughter on the phone.

## 2016-04-21 NOTE — ED Provider Notes (Signed)
Labs Reviewed  CBC WITH DIFFERENTIAL/PLATELET - Abnormal; Notable for the following:       Result Value   WBC 12.5 (*)    RBC 4.23 (*)    HCT 39.3 (*)    Platelets 109 (*)    Neutro Abs 10.1 (*)    All other components within normal limits  COMPREHENSIVE METABOLIC PANEL - Abnormal; Notable for the following:    BUN 42 (*)    Creatinine, Ser 1.29 (*)    ALT 7 (*)    Total Bilirubin 1.5 (*)    GFR calc non Af Amer 47 (*)    GFR calc Af Amer 55 (*)    All other components within normal limits  TROPONIN I - Abnormal; Notable for the following:    Troponin I 0.04 (*)    All other components within normal limits  URINALYSIS COMPLETEWITH MICROSCOPIC ([facility] ONLY) - Abnormal; Notable for the following:    Color, Urine AMBER (*)    APPearance CLEAR (*)    Ketones, ur TRACE (*)    Hgb urine dipstick 1+ (*)    Protein, ur 30 (*)    All other components within normal limits  TROPONIN I - Abnormal; Notable for the following:    Troponin I 0.03 (*)    All other components within normal limits  LIPASE, BLOOD   Repeat troponin is negative, patient will be discharged with a Foley catheter. He is stable for outpatient follow-up.   Emily FilbertJonathan E Getsemani Lindon, MD 04/21/16 [phone]

## 2016-04-21 NOTE — ED Notes (Signed)
Pt given chocolate milk with a straw. Pt also repositioned on side. Repeat troponin drawn and sent.

## 2016-04-21 NOTE — ED Notes (Signed)
Daughter Eber JonesCarolyn [phone]

## 2016-04-24 ENCOUNTER — Emergency Department
Admission: EM | Admit: 2016-04-24 | Discharge: 2016-04-24 | Disposition: A | Discharge status: Home / Self Care | Payer: Medicare Other | Attending: Emergency Medicine | Admitting: Emergency Medicine

## 2016-04-24 ENCOUNTER — Emergency Department
Admission: EM | Admit: 2016-04-24 | Discharge: 2016-04-24 | Disposition: A | Discharge status: Home / Self Care | Payer: Medicare Other | Source: Home / Self Care | Attending: Emergency Medicine | Admitting: Emergency Medicine

## 2016-04-24 ENCOUNTER — Observation Stay
Admission: EM | Admit: 2016-04-24 | Discharge: 2016-04-25 | Disposition: A | Discharge status: Home / Self Care | Payer: Medicare Other | Attending: Internal Medicine | Admitting: Internal Medicine

## 2016-04-24 ENCOUNTER — Encounter: Payer: Self-pay | Admitting: *Deleted

## 2016-04-24 ENCOUNTER — Encounter: Payer: Self-pay | Admitting: Emergency Medicine

## 2016-04-24 DIAGNOSIS — G2581 Restless legs syndrome: Secondary | ICD-10-CM | POA: Insufficient documentation

## 2016-04-24 DIAGNOSIS — T839XXD Unspecified complication of genitourinary prosthetic device, implant and graft, subsequent encounter: Secondary | ICD-10-CM

## 2016-04-24 DIAGNOSIS — Z66 Do not resuscitate: Secondary | ICD-10-CM | POA: Diagnosis not present

## 2016-04-24 DIAGNOSIS — I10 Essential (primary) hypertension: Secondary | ICD-10-CM | POA: Insufficient documentation

## 2016-04-24 DIAGNOSIS — F419 Anxiety disorder, unspecified: Secondary | ICD-10-CM | POA: Insufficient documentation

## 2016-04-24 DIAGNOSIS — Z960 Presence of urogenital implants: Secondary | ICD-10-CM

## 2016-04-24 DIAGNOSIS — K5900 Constipation, unspecified: Secondary | ICD-10-CM | POA: Diagnosis not present

## 2016-04-24 DIAGNOSIS — I2510 Atherosclerotic heart disease of native coronary artery without angina pectoris: Secondary | ICD-10-CM | POA: Insufficient documentation

## 2016-04-24 DIAGNOSIS — Y828 Other medical devices associated with adverse incidents: Secondary | ICD-10-CM | POA: Diagnosis not present

## 2016-04-24 DIAGNOSIS — T83098D Other mechanical complication of other indwelling urethral catheter, subsequent encounter: Secondary | ICD-10-CM | POA: Diagnosis present

## 2016-04-24 DIAGNOSIS — E039 Hypothyroidism, unspecified: Secondary | ICD-10-CM | POA: Diagnosis not present

## 2016-04-24 DIAGNOSIS — R339 Retention of urine, unspecified: Secondary | ICD-10-CM

## 2016-04-24 DIAGNOSIS — G20 Parkinson's disease: Secondary | ICD-10-CM | POA: Insufficient documentation

## 2016-04-24 DIAGNOSIS — Z951 Presence of aortocoronary bypass graft: Secondary | ICD-10-CM

## 2016-04-24 DIAGNOSIS — F329 Major depressive disorder, single episode, unspecified: Secondary | ICD-10-CM | POA: Diagnosis not present

## 2016-04-24 DIAGNOSIS — K922 Gastrointestinal hemorrhage, unspecified: Secondary | ICD-10-CM | POA: Diagnosis present

## 2016-04-24 DIAGNOSIS — Z79899 Other long term (current) drug therapy: Secondary | ICD-10-CM

## 2016-04-24 LAB — CBC WITH DIFFERENTIAL/PLATELET
BASOS ABS: 0 10*3/uL (ref 0–0.1)
BASOS PCT: 0 %
Eosinophils Absolute: 0.1 10*3/uL (ref 0–0.7)
Eosinophils Relative: 2 %
HEMATOCRIT: 36.6 % — AB (ref 40.0–52.0)
Hemoglobin: 12.9 g/dL — ABNORMAL LOW (ref 13.0–18.0)
LYMPHS PCT: 15 %
Lymphs Abs: 1.4 10*3/uL (ref 1.0–3.6)
MCH: 32.6 pg (ref 26.0–34.0)
MCHC: 35.2 g/dL (ref 32.0–36.0)
MCV: 92.7 fL (ref 80.0–100.0)
MONO ABS: 0.6 10*3/uL (ref 0.2–1.0)
Monocytes Relative: 7 %
NEUTROS ABS: 6.9 10*3/uL — AB (ref 1.4–6.5)
Neutrophils Relative %: 76 %
PLATELETS: 136 10*3/uL — AB (ref 150–440)
RBC: 3.95 MIL/uL — AB (ref 4.40–5.90)
RDW: 13.3 % (ref 11.5–14.5)
WBC: 9 10*3/uL (ref 3.8–10.6)

## 2016-04-24 LAB — CBC
HEMATOCRIT: 39.9 % — AB (ref 40.0–52.0)
Hemoglobin: 13.9 g/dL (ref 13.0–18.0)
MCH: 32.1 pg (ref 26.0–34.0)
MCHC: 34.7 g/dL (ref 32.0–36.0)
MCV: 92.4 fL (ref 80.0–100.0)
PLATELETS: 160 10*3/uL (ref 150–440)
RBC: 4.32 MIL/uL — ABNORMAL LOW (ref 4.40–5.90)
RDW: 13 % (ref 11.5–14.5)
WBC: 8.8 10*3/uL (ref 3.8–10.6)

## 2016-04-24 LAB — BASIC METABOLIC PANEL
ANION GAP: 6 (ref 5–15)
BUN: 27 mg/dL — ABNORMAL HIGH (ref 6–20)
CALCIUM: 8.7 mg/dL — AB (ref 8.9–10.3)
CO2: 29 mmol/L (ref 22–32)
Chloride: 103 mmol/L (ref 101–111)
Creatinine, Ser: 0.98 mg/dL (ref 0.61–1.24)
GLUCOSE: 110 mg/dL — AB (ref 65–99)
POTASSIUM: 4.7 mmol/L (ref 3.5–5.1)
Sodium: 138 mmol/L (ref 135–145)

## 2016-04-24 LAB — COMPREHENSIVE METABOLIC PANEL
ALBUMIN: 4 g/dL (ref 3.5–5.0)
ALT: 25 U/L (ref 17–63)
AST: 25 U/L (ref 15–41)
Alkaline Phosphatase: 56 U/L (ref 38–126)
Anion gap: 8 (ref 5–15)
BUN: 26 mg/dL — AB (ref 6–20)
CHLORIDE: 103 mmol/L (ref 101–111)
CO2: 28 mmol/L (ref 22–32)
CREATININE: 1.03 mg/dL (ref 0.61–1.24)
Calcium: 9.2 mg/dL (ref 8.9–10.3)
GFR calc Af Amer: >60 mL/min (ref >60)
GLUCOSE: 109 mg/dL — AB (ref 65–99)
POTASSIUM: 4.4 mmol/L (ref 3.5–5.1)
Sodium: 139 mmol/L (ref 135–145)
Total Bilirubin: 0.7 mg/dL (ref 0.3–1.2)
Total Protein: 7.1 g/dL (ref 6.5–8.1)

## 2016-04-24 LAB — URINALYSIS COMPLETE WITH MICROSCOPIC (ARMC ONLY)
BACTERIA UA: NONE SEEN
Glucose, UA: NEGATIVE mg/dL
Nitrite: NEGATIVE
PH: 6 (ref 5.0–8.0)
PROTEIN: 100 mg/dL — AB
Specific Gravity, Urine: 1.024 (ref 1.005–1.030)

## 2016-04-24 LAB — TYPE AND SCREEN
ABO/RH(D): A NEG
Antibody Screen: NEGATIVE

## 2016-04-24 MED ORDER — CEPHALEXIN 500 MG PO CAPS
500.0000 mg | ORAL_CAPSULE | Freq: Once | ORAL | Status: AC
  Administered 2016-04-24: 500 mg via ORAL
  Filled 2016-04-24: qty 1

## 2016-04-24 NOTE — ED Triage Notes (Signed)
[facility]. Patient was seen in our ED 3 days ago and had a foley catheter place, patient states that he has been having pain in his penis since the catheter was placed. Pt is in NAD at this time.

## 2016-04-24 NOTE — ED Notes (Signed)
Attempted to call facility x1.  

## 2016-04-24 NOTE — ED Provider Notes (Signed)
[facility] Emergency Department Provider Note  Time seen: 10:21 PM  I have reviewed the triage vital signs and the nursing notes.   HISTORY  Chief Complaint Rectal Bleeding    HPI Anthony Blankenship is a 82 y.o. male with a past medical history of hypertension, Parkinson's, who presents the emergency department for rectal bleeding. According to EMS report the patient was just discharged from the ER, return to the nursing facility and was noted several hours later do have a large bowel movement with bright red blood and blood clots so they sent him back to the emergency department. I saw the patient earlier today for a Foley catheter problem. Had no abdominal pain at that time. Patient continues to deny any abdominal pain at this time. Does not believe he has ever had a GI bleed in the past.  Past Medical History:  Diagnosis Date  . Anxiety   . Coronary artery disease   . Depression   . Hypertension   . Kidney stones   . Parkinson's disease (HCC)   . Restless leg syndrome     Patient Active Problem List   Diagnosis Date Noted  . Restlessness 05/14/2015    Past Surgical History:  Procedure Laterality Date  . APPENDECTOMY    . CORONARY ARTERY BYPASS GRAFT      Prior to Admission medications   Medication Sig Start Date End Date Taking? Authorizing Provider  carbidopa-levodopa-entacapone (STALEVO) 50-200-200 MG tablet Take 1 tablet by mouth 4 (four) times daily.    Historical Provider, MD  cephALEXin (KEFLEX) 250 MG capsule Take 1 capsule (250 mg total) by mouth 4 (four) times daily. 04/21/16 05/01/16  Emily FilbertJonathan E Williams, MD  diazepam (VALIUM) 2 MG tablet Take 1 tablet (2 mg total) by mouth every 8 (eight) hours as needed for muscle spasms (restless leg, agitation). 05/18/15 05/17/16  Emily FilbertJonathan E Williams, MD  levothyroxine (SYNTHROID, LEVOTHROID) 50 MCG tablet Take 50 mcg by mouth daily.     Historical Provider, MD  rOPINIRole (REQUIP) 1 MG tablet Take 1  tablet (1 mg total) by mouth 3 (three) times daily. 05/15/15   Enedina FinnerSona Patel, MD    No Known Allergies  Family History  Problem Relation Age of Onset  . Liver disease Father     Social History Social History  Substance Use Topics  . Smoking status: Never Smoker  . Smokeless tobacco: Never Used  . Alcohol use No    Review of Systems Constitutional: Negative for fever. Cardiovascular: Negative for chest pain. Respiratory: Negative for shortness of breath. Gastrointestinal: Negative for abdominal pain. Positive for rectal bleeding. Neurological: Negative for headache 10-point ROS otherwise negative.  ____________________________________________   PHYSICAL EXAM:  VITAL SIGNS: ED Triage Vitals [04/24/16 2115]  Enc Vitals Group     BP (!) 156/75     Pulse Rate 78     Resp (!) 22     Temp 98.2 F (36.8 C)     Temp Source Oral     SpO2 98 %     Weight      Height      Head Circumference      Peak Flow      Pain Score      Pain Loc      Pain Edu?      Excl. in GC?     Constitutional: Alert and oriented. Well appearing and in no distress. Eyes: Normal exam ENT   Head: Normocephalic and atraumatic.   Mouth/Throat:  Mucous membranes are moist. Cardiovascular: Normal rate, regular rhythm.  Respiratory: Normal respiratory effort without tachypnea nor retractions. Breath sounds are clear  Gastrointestinal: Soft and nontender. No distention. Rectal exam shows maroon stool strongly guaiac positive, with blood clot. Musculoskeletal: Nontender with normal range of motion in all extremities.  Neurologic:  Normal speech and language. No gross focal neurologic deficits Skin:  Skin is warm, dry and intact.  Psychiatric: Mood and affect are normal. Speech and behavior are normal.   ____________________________________________   INITIAL IMPRESSION / ASSESSMENT AND PLAN / ED COURSE  Pertinent labs & imaging results that were available during my care of the patient were  reviewed by me and considered in my medical decision making (see chart for details).  The patient presents emergency department for rectal bleeding. This patient's third ER visit today however the first 2 visits were for a Foley catheter issue unrelated to tonight's presentation. Patient has significant rectal bleeding on rectal examination. Labs are largely within normal limits. Hemoglobin is normal. Given the patient's significant rectal bleeding on rectal examination we will admit to the hospital for further treatment and monitoring. Patient is agreeable to this plan.  ____________________________________________   FINAL CLINICAL IMPRESSION(S) / ED DIAGNOSES  Lower GI bleed    Minna AntisKevin Vale Peraza, MD 04/24/16 2225

## 2016-04-24 NOTE — ED Triage Notes (Signed)
Report per EMS: Asst. Living found that pt had dark red stool w/ some clots. Pt wears a diaper, clean diaper applied prior to transport. Foley cath in place and draining upon arrival. Urine cloudy and yellow. Pt c/o restlessness and keeps trying to get out of bed.

## 2016-04-24 NOTE — ED Notes (Signed)
Bladder scan completed. 18ml found.

## 2016-04-24 NOTE — ED Notes (Signed)
Given pt chocolate milk  

## 2016-04-24 NOTE — ED Notes (Signed)
Bladder Scan = 30 mL of urine

## 2016-04-24 NOTE — ED Notes (Signed)
Pt unable to sign d/t being in hallbed  Upon D/C

## 2016-04-24 NOTE — ED Notes (Signed)
Report given to [facility] the [facility]. EMS notified for transport.

## 2016-04-24 NOTE — ED Notes (Signed)
Attempted to call report to the [facility], [facility] are not able to accept patient back if he still has foley catheter in place because they are assisted living. I informed [facility] that patient came from their facility with the catheter in place. Per [facility] their administration will not let the patient call back. I told her I would speak with the charge nurse and call them back.

## 2016-04-24 NOTE — ED Notes (Signed)
Given pt chocolate milk

## 2016-04-24 NOTE — ED Provider Notes (Addendum)
[facility] Emergency Department Provider Note  Time seen: 4:50 PM  I have reviewed the triage vital signs and the nursing notes.   HISTORY  Chief Complaint Penis Pain    HPI Anthony Blankenship is a 82 y.o. male with a past medical history of Parkinson's, hypertension, restlessness, who presents the emergency department for a Foley catheter issue. According to nursing home staff the patient keeps pulling on his Foley catheter. There trying to keep a close eye and him but he has not understanding he cannot pull on the catheter. They sent him to the emergency department earlier today for the same. Patient was discharged back to the facility. They state the patient continues to pull on the catheter so they sent him back to the emergency department. The catheter was placed 04/21/16 for urinary retention greater than 1 L. Patient does not appear to be in any discomfort. In the emergency department is not attempting to pull on the catheter.  Past Medical History:  Diagnosis Date  . Anxiety   . Coronary artery disease   . Depression   . Hypertension   . Kidney stones   . Parkinson's disease (HCC)   . Restless leg syndrome     Patient Active Problem List   Diagnosis Date Noted  . Restlessness 05/14/2015    Past Surgical History:  Procedure Laterality Date  . APPENDECTOMY    . CORONARY ARTERY BYPASS GRAFT      Prior to Admission medications   Medication Sig Start Date End Date Taking? Authorizing Provider  carbidopa-levodopa-entacapone (STALEVO) 50-200-200 MG tablet Take 1 tablet by mouth 4 (four) times daily.    Historical Provider, MD  cephALEXin (KEFLEX) 250 MG capsule Take 1 capsule (250 mg total) by mouth 4 (four) times daily. 04/21/16 05/01/16  Emily FilbertJonathan E Williams, MD  diazepam (VALIUM) 2 MG tablet Take 1 tablet (2 mg total) by mouth every 8 (eight) hours as needed for muscle spasms (restless leg, agitation). 05/18/15 05/17/16  Emily FilbertJonathan E Williams, MD   levothyroxine (SYNTHROID, LEVOTHROID) 50 MCG tablet Take 50 mcg by mouth daily.     Historical Provider, MD  rOPINIRole (REQUIP) 1 MG tablet Take 1 tablet (1 mg total) by mouth 3 (three) times daily. 05/15/15   Enedina FinnerSona Patel, MD    No Known Allergies  Family History  Problem Relation Age of Onset  . Liver disease Father     Social History Social History  Substance Use Topics  . Smoking status: Never Smoker  . Smokeless tobacco: Never Used  . Alcohol use No    Review of Systems Constitutional: Negative for fever. Cardiovascular: Negative for chest pain. Respiratory: Negative for shortness of breath. Gastrointestinal: Negative for abdominal pain Genitourinary: Does state pain around his penis. Neurological: Negative for headache 10-point ROS otherwise negative.  ____________________________________________   PHYSICAL EXAM:  VITAL SIGNS: ED Triage Vitals  Enc Vitals Group     BP      Pulse      Resp      Temp      Temp src      SpO2      Weight      Height      Head Circumference      Peak Flow      Pain Score      Pain Loc      Pain Edu?      Excl. in GC?     Constitutional: Alert. Well appearing and in no distress.Tremulous,  but history of Parkinson's. Eyes: Normal exam ENT   Head: Normocephalic and atraumatic   Mouth/Throat: Mucous membranes are moist. Cardiovascular: Normal rate, regular rhythm.  Respiratory: Normal respiratory effort without tachypnea nor retractions. Breath sounds are clear  Gastrointestinal: Soft and nontender. No distention.   Genitourinary: Foley catheter present. Urethra meatus appears normal, no bleeding or other obvious signs of trauma Musculoskeletal: Nontender with normal range of motion in all extremities.  Neurologic:  Tremulous speech, extremities or shaking but the patient has a history of Parkinson's. Is able to answer questions appropriately. Skin:  Skin is warm, dry and intact.  Psychiatric: Mood and affect are  normal.  ____________________________________________   INITIAL IMPRESSION / ASSESSMENT AND PLAN / ED COURSE  Pertinent labs & imaging results that were available during my care of the patient were reviewed by me and considered in my medical decision making (see chart for details).  Patient presents the emergency department for pulling on his Foley catheter. The catheter is placed 04/21/16. We're hoping to keep the catheter in place for at least one week before attempting removal. I spoke to the patient's nurse at his nursing facility, she states he keeps pulling on the catheter so they did not do so they sent him back to the emergency department. I discussed with the nurse that we are hoping to leave it in for 5-7 days to allow a prostate and urethra to heal before removing the catheter. We will place a Foley catheter anchor/leg strap, to hopefully prevent the patient from pulling on the catheter. Patient will be seen by his doctor on Tuesday per the nurse.  Patient has 18 ML on bladder scan. Catheter is draining appropriately.  ____________________________________________   FINAL CLINICAL IMPRESSION(S) / ED DIAGNOSES  Foley catheter complication    Minna AntisKevin Jobe Mutch, MD 04/24/16 1658    Minna AntisKevin Cythina Mickelsen, MD 04/24/16 [phone]

## 2016-04-24 NOTE — ED Provider Notes (Addendum)
[facility] Emergency Department Provider Note  ____________________________________________   I have reviewed the triage vital signs and the nursing notes.   HISTORY  Chief Complaint Penis Pain Also: can I have some chocolate milk   HPI Anthony Blankenship is a 82 y.o. male who recently had a Foley catheter placed for urinary retention. He states it is uncomfortable have a catheter in. He states is been unremarkable since they put it in. He is not sure why he has to have it. He has no other complaint except for the fact that he feels like he really needs some chocolate milk which he has asked for many times.     Past Medical History:  Diagnosis Date  . Anxiety   . Coronary artery disease   . Depression   . Hypertension   . Kidney stones   . Parkinson's disease (HCC)   . Restless leg syndrome     Patient Active Problem List   Diagnosis Date Noted  . Restlessness 05/14/2015    Past Surgical History:  Procedure Laterality Date  . APPENDECTOMY    . CORONARY ARTERY BYPASS GRAFT      Prior to Admission medications   Medication Sig Start Date End Date Taking? Authorizing Provider  carbidopa-levodopa-entacapone (STALEVO) 50-200-200 MG tablet Take 1 tablet by mouth 4 (four) times daily.    Historical Provider, MD  cephALEXin (KEFLEX) 250 MG capsule Take 1 capsule (250 mg total) by mouth 4 (four) times daily. 04/21/16 05/01/16  Emily FilbertJonathan E Williams, MD  diazepam (VALIUM) 2 MG tablet Take 1 tablet (2 mg total) by mouth every 8 (eight) hours as needed for muscle spasms (restless leg, agitation). 05/18/15 05/17/16  Emily FilbertJonathan E Williams, MD  levothyroxine (SYNTHROID, LEVOTHROID) 50 MCG tablet Take 50 mcg by mouth daily.     Historical Provider, MD  rOPINIRole (REQUIP) 1 MG tablet Take 1 tablet (1 mg total) by mouth 3 (three) times daily. 05/15/15   Enedina FinnerSona Patel, MD    Allergies Review of patient's allergies indicates no known allergies.  Family  History  Problem Relation Age of Onset  . Liver disease Father     Social History Social History  Substance Use Topics  . Smoking status: Never Smoker  . Smokeless tobacco: Never Used  . Alcohol use No    Review of Systems Constitutional: No fever/chills Eyes: No visual changes. ENT: No sore throat. No stiff neck no neck pain Cardiovascular: Denies chest pain. Respiratory: Denies shortness of breath. Gastrointestinal:   no vomiting.  No diarrhea.  No constipation. Genitourinary: Negative for dysuria. Musculoskeletal: Negative lower extremity swelling Skin: Negative for rash. Neurological: Negative for severe headaches, focal weakness or numbness. 10-point ROS otherwise negative.  ____________________________________________   PHYSICAL EXAM:  VITAL SIGNS: ED Triage Vitals  Enc Vitals Group     BP 04/24/16 0820 (!) 157/77     Pulse Rate 04/24/16 0820 62     Resp 04/24/16 0820 16     Temp 04/24/16 0820 98.2 F (36.8 C)     Temp Source 04/24/16 0820 Oral     SpO2 04/24/16 0820 100 %     Weight 04/24/16 0822 151 lb 7.3 oz (68.7 kg)     Height 04/24/16 0822 5\' 9"  (1.753 m)     Head Circumference --      Peak Flow --      Pain Score 04/24/16 0822 6     Pain Loc --      Pain Edu? --  Excl. in GC? --     Constitutional: Alert and oriented. Well appearing and in no acute distress. Cardiovascular: Normal rate, regular rhythm. Grossly normal heart sounds.  Good peripheral circulation. Respiratory: Normal respiratory effort.  No retractions. Lungs CTAB. Abdominal: Soft and nontender. No distention. No guarding no rebound Back:  There is no focal tenderness or step off.  there is no midline tenderness there are no lesions noted. there is no CVA tenderness GU: Normal stromal genitalia Foley catheter in place no erythema no tenderness normal testicular and scrotal exam no evidence of cellulitis. Musculoskeletal: No lower extremity tenderness, no upper extremity  tenderness. No joint effusions, no DVT signs strong distal pulses no edema Neurologic:  Normal speech and language. No gross focal neurologic deficits are appreciated.  Skin:  Skin is warm, dry and intact. No rash noted. Psychiatric: Mood and affect are normal. Speech and behavior are normal.  ____________________________________________   LABS (all labs ordered are listed, but only abnormal results are displayed)  Labs Reviewed  URINALYSIS COMPLETEWITH MICROSCOPIC ([facility] ONLY)  CBC WITH DIFFERENTIAL/PLATELET  BASIC METABOLIC PANEL   ____________________________________________  EKG  I personally interpreted any EKGs ordered by me or triage  ____________________________________________  RADIOLOGY  I reviewed any imaging ordered by me or triage that were performed during my shift and, if possible, patient and/or family made aware of any abnormal findings. ____________________________________________   PROCEDURES  Procedure(s) performed: None  Procedures  Critical Care performed: None  ____________________________________________   INITIAL IMPRESSION / ASSESSMENT AND PLAN / ED COURSE  Pertinent labs & imaging results that were available during my care of the patient were reviewed by me and considered in my medical decision making (see chart for details).  Patient states he doesn't like having a catheter in but he had 1000 cc of urine and when it was placed. This was 3 days ago. We will keep it in until he is seen by urology. Bedside ultrasound shows 30 cc of urine he is not retaining the catheter is draining well, no evidence of obstructed catheter. We'll check kidney function CBC urinalysis and we'll give the patient some chocolate milk   ----------------------------------------- 9:55 AM on 04/24/2016 -----------------------------------------  Are no white cells in his urine, there is no convincing are given for urinary tract infection most of it is a little bit of  hematuria which is likely secondary to the catheter itself although other urology should be ruled out and he will follow up closely with urology. As a precaution we'll Continue him on Keflex. Urine culture has been sent. A did do a rectal exam on him. His prostate seems firm but not boggy and not markedly tender. Low suspicion for prostatitis. He has no complaints after his chocolate milk. We will have him follow closely with urology. Obviously, will not yet remove the Foley as he had significant urinary retention 2 days ago.  Clinical Course   ____________________________________________   FINAL CLINICAL IMPRESSION(S) / ED DIAGNOSES  Final diagnoses:  None      This chart was dictated using voice recognition software.  Despite best efforts to proofread,  errors can occur which can change meaning.      Jeanmarie PlantJames A Mena Simonis, MD 04/24/16 [id number]    Jeanmarie PlantJames A Marguriete Wootan, MD 04/24/16 [phone]    Jeanmarie PlantJames A Chariah Bailey, MD 04/24/16 1004

## 2016-04-24 NOTE — ED Triage Notes (Signed)
Per EMS the [facility] reported that pt pulled Foley loose but not completely out. Pt reports some pain at Foley site.

## 2016-04-24 NOTE — Discharge Instructions (Signed)
Please follow-up with your doctor this week to decide when the catheter should be removed ideally we would like to leave the catheter in place for at least 7 days to allow prostate/urethral healing prior to removal. Please return to the emergency department for any further concerning symptoms.

## 2016-04-24 NOTE — ED Notes (Signed)
Attempted to call facility x2 

## 2016-04-25 DIAGNOSIS — K922 Gastrointestinal hemorrhage, unspecified: Secondary | ICD-10-CM | POA: Diagnosis not present

## 2016-04-25 LAB — CBC
HCT: 39.2 % — ABNORMAL LOW (ref 40.0–52.0)
HCT: 40.6 % (ref 40.0–52.0)
HEMATOCRIT: 42 % (ref 40.0–52.0)
HEMATOCRIT: 35.4 % — AB (ref 40.0–52.0)
HEMOGLOBIN: 14.5 g/dL (ref 13.0–18.0)
HEMOGLOBIN: 12.3 g/dL — AB (ref 13.0–18.0)
Hemoglobin: 13.7 g/dL (ref 13.0–18.0)
Hemoglobin: 13.7 g/dL (ref 13.0–18.0)
MCH: 32.2 pg (ref 26.0–34.0)
MCH: 32.4 pg (ref 26.0–34.0)
MCH: 31.7 pg (ref 26.0–34.0)
MCH: 32 pg (ref 26.0–34.0)
MCHC: 34.5 g/dL (ref 32.0–36.0)
MCHC: 34.9 g/dL (ref 32.0–36.0)
MCHC: 33.7 g/dL (ref 32.0–36.0)
MCHC: 34.8 g/dL (ref 32.0–36.0)
MCV: 93.3 fL (ref 80.0–100.0)
MCV: 92.9 fL (ref 80.0–100.0)
MCV: 93.9 fL (ref 80.0–100.0)
MCV: 92.1 fL (ref 80.0–100.0)
PLATELETS: 157 10*3/uL (ref 150–440)
PLATELETS: 150 10*3/uL (ref 150–440)
Platelets: 171 10*3/uL (ref 150–440)
Platelets: 151 10*3/uL (ref 150–440)
RBC: 4.5 MIL/uL (ref 4.40–5.90)
RBC: 4.23 MIL/uL — AB (ref 4.40–5.90)
RBC: 4.32 MIL/uL — ABNORMAL LOW (ref 4.40–5.90)
RBC: 3.84 MIL/uL — AB (ref 4.40–5.90)
RDW: 13.3 % (ref 11.5–14.5)
RDW: 12.7 % (ref 11.5–14.5)
RDW: 12.9 % (ref 11.5–14.5)
RDW: 13.1 % (ref 11.5–14.5)
WBC: 9.5 10*3/uL (ref 3.8–10.6)
WBC: 8.7 10*3/uL (ref 3.8–10.6)
WBC: 8.8 10*3/uL (ref 3.8–10.6)
WBC: 7.9 10*3/uL (ref 3.8–10.6)

## 2016-04-25 LAB — COMPREHENSIVE METABOLIC PANEL
ALT: 23 U/L (ref 17–63)
AST: 21 U/L (ref 15–41)
Albumin: 3.7 g/dL (ref 3.5–5.0)
Alkaline Phosphatase: 50 U/L (ref 38–126)
Anion gap: 7 (ref 5–15)
BUN: 21 mg/dL — ABNORMAL HIGH (ref 6–20)
CO2: 25 mmol/L (ref 22–32)
Calcium: 8.7 mg/dL — ABNORMAL LOW (ref 8.9–10.3)
Chloride: 108 mmol/L (ref 101–111)
Creatinine, Ser: 0.9 mg/dL (ref 0.61–1.24)
GFR calc Af Amer: >60 mL/min (ref >60)
GFR calc non Af Amer: >60 mL/min (ref >60)
Glucose, Bld: 96 mg/dL (ref 65–99)
Potassium: 4.2 mmol/L (ref 3.5–5.1)
Sodium: 140 mmol/L (ref 135–145)
Total Bilirubin: 0.6 mg/dL (ref 0.3–1.2)
Total Protein: 6.5 g/dL (ref 6.5–8.1)

## 2016-04-25 LAB — URINE CULTURE: CULTURE: NO GROWTH

## 2016-04-25 LAB — MRSA PCR SCREENING: MRSA BY PCR: NEGATIVE

## 2016-04-25 MED ORDER — ROPINIROLE HCL 1 MG PO TABS
1.0000 mg | ORAL_TABLET | Freq: Once | ORAL | Status: AC
  Administered 2016-04-25: 1 mg via ORAL
  Filled 2016-04-25: qty 1

## 2016-04-25 MED ORDER — LEVOTHYROXINE SODIUM 50 MCG PO TABS
50.0000 ug | ORAL_TABLET | Freq: Every day | ORAL | Status: DC
  Administered 2016-04-25: 50 ug via ORAL
  Filled 2016-04-25: qty 1

## 2016-04-25 MED ORDER — LOPERAMIDE HCL 2 MG PO CAPS: 2.0000 mg | ORAL_CAPSULE | ORAL | Status: DC | PRN

## 2016-04-25 MED ORDER — SODIUM CHLORIDE 0.9 % IV SOLN
INTRAVENOUS | Status: DC
  Administered 2016-04-25: 02:00:00 via INTRAVENOUS

## 2016-04-25 MED ORDER — PANTOPRAZOLE SODIUM 40 MG IV SOLR
40.0000 mg | Freq: Two times a day (BID) | INTRAVENOUS | Status: DC
  Administered 2016-04-25 (×2): 40 mg via INTRAVENOUS
  Filled 2016-04-25 (×3): qty 40

## 2016-04-25 MED ORDER — ONDANSETRON HCL 4 MG PO TABS: 4.0000 mg | ORAL_TABLET | Freq: Four times a day (QID) | ORAL | Status: DC | PRN

## 2016-04-25 MED ORDER — ONDANSETRON HCL 4 MG/2ML IJ SOLN: 4.0000 mg | Freq: Four times a day (QID) | INTRAMUSCULAR | Status: DC | PRN

## 2016-04-25 MED ORDER — ROPINIROLE HCL 1 MG PO TABS
1.0000 mg | ORAL_TABLET | Freq: Three times a day (TID) | ORAL | Status: DC
  Administered 2016-04-25: 1 mg via ORAL
  Filled 2016-04-25: qty 1

## 2016-04-25 MED ORDER — TRAMADOL HCL 50 MG PO TABS
50.0000 mg | ORAL_TABLET | Freq: Two times a day (BID) | ORAL | Status: DC
  Administered 2016-04-25 (×2): 50 mg via ORAL
  Filled 2016-04-25 (×2): qty 1

## 2016-04-25 MED ORDER — ENTACAPONE 200 MG PO TABS
200.0000 mg | ORAL_TABLET | Freq: Four times a day (QID) | ORAL | Status: DC
  Administered 2016-04-25: 200 mg via ORAL
  Filled 2016-04-25 (×4): qty 1

## 2016-04-25 MED ORDER — ACETAMINOPHEN 650 MG RE SUPP: 650.0000 mg | Freq: Four times a day (QID) | RECTAL | Status: DC | PRN

## 2016-04-25 MED ORDER — CARBIDOPA-LEVODOPA-ENTACAPONE 50-200-200 MG PO TABS: 1.0000 | ORAL_TABLET | Freq: Four times a day (QID) | ORAL | Status: DC

## 2016-04-25 MED ORDER — SENNOSIDES-DOCUSATE SODIUM 8.6-50 MG PO TABS: 1.0000 | ORAL_TABLET | Freq: Every evening | ORAL | 1 refills | Status: DC | PRN

## 2016-04-25 MED ORDER — CARBIDOPA-LEVODOPA 25-100 MG PO TABS
2.0000 | ORAL_TABLET | Freq: Four times a day (QID) | ORAL | Status: DC
  Administered 2016-04-25: 2 via ORAL
  Filled 2016-04-25: qty 2

## 2016-04-25 MED ORDER — HYDROCODONE-ACETAMINOPHEN 5-325 MG PO TABS: 1.0000 | ORAL_TABLET | ORAL | Status: DC | PRN

## 2016-04-25 MED ORDER — ACETAMINOPHEN 325 MG PO TABS: 650.0000 mg | ORAL_TABLET | Freq: Four times a day (QID) | ORAL | Status: DC | PRN

## 2016-04-25 MED ORDER — BISACODYL 5 MG PO TBEC: 5.0000 mg | DELAYED_RELEASE_TABLET | Freq: Every day | ORAL | Status: DC | PRN

## 2016-04-25 MED ORDER — DIAZEPAM 2 MG PO TABS
2.0000 mg | ORAL_TABLET | Freq: Three times a day (TID) | ORAL | Status: DC | PRN
  Administered 2016-04-25: 2 mg via ORAL
  Filled 2016-04-25: qty 1

## 2016-04-25 MED ORDER — MAGNESIUM CITRATE PO SOLN
1.0000 | Freq: Once | ORAL | Status: DC | PRN
Start: 2016-04-25 — End: 2016-04-25
  Filled 2016-04-25: qty 296

## 2016-04-25 MED ORDER — SENNOSIDES-DOCUSATE SODIUM 8.6-50 MG PO TABS: 1.0000 | ORAL_TABLET | Freq: Every evening | ORAL | Status: DC | PRN

## 2016-04-25 MED ORDER — SODIUM CHLORIDE 0.9% FLUSH
3.0000 mL | Freq: Two times a day (BID) | INTRAVENOUS | Status: DC
  Administered 2016-04-25 (×2): 3 mL via INTRAVENOUS

## 2016-04-25 MED ORDER — CEPHALEXIN 250 MG PO CAPS
250.0000 mg | ORAL_CAPSULE | Freq: Four times a day (QID) | ORAL | Status: DC
  Administered 2016-04-25: 250 mg via ORAL
  Filled 2016-04-25 (×5): qty 1

## 2016-04-25 NOTE — Consult Note (Signed)
Consultation  Referring Provider:     No ref. provider found Primary Care Physician:  Pcp Not In System Primary Gastroenterologist:  Dr. Nedra HaiLee         Reason for Consultation:    History of a small amount of hematochezia with constipation.  Date of Admission:  04/24/2016 Date of Consultation:  04/25/2016         HPI:   Anthony Blankenship is a 82 y.o. male with a history of a small amount of hematochezia and constipation . His H/H is stable and there is no active bleeding at this time.  Past Medical History:  Diagnosis Date  . Anxiety   . Coronary artery disease   . Depression   . Hypertension   . Kidney stones   . Parkinson's disease (HCC)   . Restless leg syndrome     Past Surgical History:  Procedure Laterality Date  . APPENDECTOMY    . CORONARY ARTERY BYPASS GRAFT      Prior to Admission medications   Medication Sig Start Date End Date Taking? Authorizing Provider  acetaminophen (TYLENOL) 325 MG tablet Take 650 mg by mouth every 6 (six) hours as needed.   Yes Historical Provider, MD  carbidopa-levodopa-entacapone (STALEVO) 50-200-200 MG tablet Take 1 tablet by mouth 4 (four) times daily.   Yes Historical Provider, MD  cephALEXin (KEFLEX) 250 MG capsule Take 1 capsule (250 mg total) by mouth 4 (four) times daily. 04/21/16 05/01/16 Yes Emily FilbertJonathan E Williams, MD  diazepam (VALIUM) 2 MG tablet Take 1 tablet (2 mg total) by mouth every 8 (eight) hours as needed for muscle spasms (restless leg, agitation). 05/18/15 05/17/16 Yes Emily FilbertJonathan E Williams, MD  levothyroxine (SYNTHROID, LEVOTHROID) 50 MCG tablet Take 50 mcg by mouth daily.    Yes Historical Provider, MD  loperamide (IMODIUM) 2 MG capsule Take 2 mg by mouth as needed for diarrhea or loose stools.   Yes Historical Provider, MD  rOPINIRole (REQUIP) 1 MG tablet Take 1 tablet (1 mg total) by mouth 3 (three) times daily. 05/15/15  Yes Enedina FinnerSona Patel, MD  traMADol (ULTRAM) 50 MG tablet Take 50 mg by mouth 2 (two) times daily.   Yes  Historical Provider, MD  senna-docusate (SENOKOT-S) 8.6-50 MG tablet Take 1 tablet by mouth at bedtime as needed for mild constipation. 04/25/16   Enedina FinnerSona Patel, MD    Family History  Problem Relation Age of Onset  . Liver disease Father      Social History  Substance Use Topics  . Smoking status: Never Smoker  . Smokeless tobacco: Never Used  . Alcohol use No    Allergies as of 04/24/2016  . (No Known Allergies)    Review of Systems:    All systems reviewed and negative except where noted in HPI.   Physical Exam:  Vital signs in last 24 hours: Temp:  [97.5 F (36.4 C)-98.8 F (37.1 C)] 97.5 F (36.4 C) (10/30 1342) Pulse Rate:  [59-83] 59 (10/30 1342) Resp:  [12-22] 16 (10/30 1342) BP: (110-167)/(50-104) 110/50 (10/30 1342) SpO2:  [91 %-100 %] 98 % (10/30 1342) Weight:  [144 lb 1.6 oz (65.4 kg)] 144 lb 1.6 oz (65.4 kg) (10/30 0116) Last BM Date: 04/25/16 General:   Pleasant, cooperative in NAD Head:  Normocephalic and atraumatic. Eyes:   No icterus.   Conjunctiva pink. PERRLA. Ears:  Normal auditory acuity. Neck:  Supple; no masses or thyroidomegaly Lungs: Respirations even and unlabored. Lungs clear to auscultation bilaterally.   No wheezes, crackles,  or rhonchi.  Heart:  Regular rate and rhythm;  Without murmur, clicks, rubs or gallops Abdomen:  Soft, nondistended, nontender. Normal bowel sounds. No appreciable masses or hepatomegaly.  No rebound or guarding.  Rectal:  Not performed. Msk:  Symmetrical without gross deformities.  Strength - generalized weakness  Extremities:  Without edema, cyanosis or clubbing. Neurologic:  Alert and oriented x3;  grossly normal neurologically. Skin:  Intact without significant lesions or rashes. Cervical Nodes:  No significant cervical adenopathy. Psych:  Alert and cooperative. Normal affect.  LAB RESULTS:  Recent Labs  04/25/16 0156 04/25/16 0616 04/25/16 1024  WBC 9.5 8.7 8.8  HGB 14.5 13.7 13.7  HCT 42.0 39.2* 40.6    PLT 157 150 171   BMET  Recent Labs  04/24/16 0822 04/24/16 2116 04/25/16 0616  NA 138 139 140  K 4.7 4.4 4.2  CL 103 103 108  CO2 29 28 25   GLUCOSE 110* 109* 96  BUN 27* 26* 21*  CREATININE 0.98 1.03 0.90  CALCIUM 8.7* 9.2 8.7*   LFT  Recent Labs  04/25/16 0616  PROT 6.5  ALBUMIN 3.7  AST 21  ALT 23  ALKPHOS 50  BILITOT 0.6   PT/INR No results for input(s): LABPROT, INR in the last 72 hours.  STUDIES: No results found.    Impression / Plan:   Anthony Blankenship is a 82 y.o. y/o male with a H/O constipation with a small amount of hematochezia with a stable H/H and no active bleeding at this time. Plan - current supportive therapy - colonoscopy at a later date depending non clinical progress. I will follow.   Thank you for involving me in the care of this patient.      LOS: 1 day   Ula Lingoichard Leona Alen, MD  04/25/2016, 1:48 PM   Note: This dictation was prepared with Dragon dictation along with smaller phrase technology. Any transcriptional errors that result from this process are unintentional.

## 2016-04-25 NOTE — Progress Notes (Signed)
04/25/2016 2:27 PM  [facility] C [facility] to be D[facility]C'd [facility] per MD order.  Discussed prescriptions and follow up appointments with the patient. Prescriptions given to patient, medication list explained in detail. Pt verbalized understanding.    Medication List    STOP taking these medications   loperamide 2 MG capsule Commonly known as:  IMODIUM     TAKE these medications   acetaminophen 325 MG tablet Commonly known as:  TYLENOL Take 650 mg by mouth every 6 (six) hours as needed.   carbidopa-levodopa-entacapone 50-200-200 MG tablet Commonly known as:  STALEVO Take 1 tablet by mouth 4 (four) times daily.   cephALEXin 250 MG capsule Commonly known as:  KEFLEX Take 1 capsule (250 mg total) by mouth 4 (four) times daily.   diazepam 2 MG tablet Commonly known as:  VALIUM Take 1 tablet (2 mg total) by mouth every 8 (eight) hours as needed for muscle spasms (restless leg, agitation).   levothyroxine 50 MCG tablet Commonly known as:  SYNTHROID, LEVOTHROID Take 50 mcg by mouth daily.   rOPINIRole 1 MG tablet Commonly known as:  REQUIP Take 1 tablet (1 mg total) by mouth 3 (three) times daily.   senna-docusate 8.6-50 MG tablet Commonly known as:  Senokot-S Take 1 tablet by mouth at bedtime as needed for mild constipation.   traMADol 50 MG tablet Commonly known as:  ULTRAM Take 50 mg by mouth 2 (two) times daily.       Vitals:   04/25/16 0800 04/25/16 1342  BP: 110/69 (!) 110/50  Pulse: (!) 59 (!) 59  Resp: 12 16  Temp: 98 F (36.7 C) 97.5 F (36.4 C)    Skin clean, dry and intact without evidence of skin break down, no evidence of skin tears noted. IV catheter discontinued intact. Site without signs and symptoms of complications. Dressing and pressure applied. Pt denies pain at this time. No complaints noted.  An After Visit Summary was printed and given to the patient. Patient escorted and D/C via EMS  Bradly Chrisougherty, Jerene Yeager E

## 2016-04-25 NOTE — Discharge Summary (Signed)
[facility] - [place] at [facility]   PATIENT NAME: Anthony Blankenship    MR#:  [id number]  DATE OF BIRTH:  Jul 31, 1924  DATE OF ADMISSION:  04/24/2016 ADMITTING PHYSICIAN: Jon Gills Hugelmeyer, DO  DATE OF DISCHARGE: 04/25/16  PRIMARY CARE PHYSICIAN: Pcp Not In System    ADMISSION DIAGNOSIS:  Lower GI bleed [K92.2]  DISCHARGE DIAGNOSIS:  Constipation related hematochezia-resolved Urinary retention s/p foley (pt has foley prior to admission)  SECONDARY DIAGNOSIS:   Past Medical History:  Diagnosis Date  . Anxiety   . Coronary artery disease   . Depression   . Hypertension   . Kidney stones   . Parkinson's disease (HCC)   . Restless leg syndrome     HOSPITAL COURSE:   82 y.o. male with a history of Hypertension, coronary artery disease, Parkinson's and anxiety now being admitted with:  1. Hematochezia/Lower GI bleed -H&H stable. Patient had a bowel movement today without any blood in stool. Spoke with Anthony Blankenship GI no indications for any luminal evaluation since patient is stable. We'll start patient on by mouth diet. -If bleeding recurs refer to GI as outpatient for sigmoidoscopy/colonoscopy -According to her daughter Anthony Blankenship patient has been constipated for the last several days. He had a large bowel movement at the facility yesterday thereafter had some hematochezia which is now resolved -We'll place patient on Senokot daily  2. Marginally elevated troponin without EKG changes appears to manage ischemia in the setting of mild tachycardia   3. History of Parkinson's-continue carbidopa levodopa  4. History of restless legs-continue Requip  5. History of hypothyroidism-continue Synthroid  6. History of anxiety-continue Valium.  Continue tramadol for pain control  7. Urinary retention patient has been with Foley catheter prior to admission He is on prophylaxis with Keflex which will be continued at discharge. He'll follow up with urology as  outpatient  DC back to the [facility] This was discussed with patient's daughter CONSULTS OBTAINED:  Treatment Team:  Ula Lingo, MD  DRUG ALLERGIES:  No Known Allergies  DISCHARGE MEDICATIONS:   Current Discharge Medication List    START taking these medications   Details  senna-docusate (SENOKOT-S) 8.6-50 MG tablet Take 1 tablet by mouth at bedtime as needed for mild constipation. Qty: 60 tablet, Refills: 1      CONTINUE these medications which have NOT CHANGED   Details  acetaminophen (TYLENOL) 325 MG tablet Take 650 mg by mouth every 6 (six) hours as needed.    carbidopa-levodopa-entacapone (STALEVO) 50-200-200 MG tablet Take 1 tablet by mouth 4 (four) times daily.    cephALEXin (KEFLEX) 250 MG capsule Take 1 capsule (250 mg total) by mouth 4 (four) times daily. Qty: 40 capsule, Refills: 0    diazepam (VALIUM) 2 MG tablet Take 1 tablet (2 mg total) by mouth every 8 (eight) hours as needed for muscle spasms (restless leg, agitation). Qty: 30 tablet, Refills: 0    levothyroxine (SYNTHROID, LEVOTHROID) 50 MCG tablet Take 50 mcg by mouth daily.     rOPINIRole (REQUIP) 1 MG tablet Take 1 tablet (1 mg total) by mouth 3 (three) times daily. Qty: 90 tablet, Refills: 0    traMADol (ULTRAM) 50 MG tablet Take 50 mg by mouth 2 (two) times daily.      STOP taking these medications     loperamide (IMODIUM) 2 MG capsule         If you experience worsening of your admission symptoms, develop shortness of breath, life threatening emergency, suicidal or homicidal  thoughts you must seek medical attention immediately by calling 911 or calling your MD immediately  if symptoms less severe.  You Must read complete instructions/literature along with all the possible adverse reactions/side effects for all the Medicines you take and that have been prescribed to you. Take any new Medicines after you have completely understood and accept all the possible adverse reactions/side effects.    Please note  You were cared for by a hospitalist during your hospital stay. If you have any questions about your discharge medications or the care you received while you were in the hospital after you are discharged, you can call the unit and asked to speak with the hospitalist on call if the hospitalist that took care of you is not available. Once you are discharged, your primary care physician will handle any further medical issues. Please note that NO REFILLS for any discharge medications will be authorized once you are discharged, as it is imperative that you return to your primary care physician (or establish a relationship with a primary care physician if you do not have one) for your aftercare needs so that they can reassess your need for medications and monitor your lab values. Today   SUBJECTIVE  Doing ok. Wants to eat  VITAL SIGNS:  Blood pressure 110/69, pulse (!) 59, temperature 98 F (36.7 C), temperature source Oral, resp. rate 12, height 5\' 9"  (1.753 m), weight 65.4 kg (144 lb 1.6 oz), SpO2 98 %.  I/O:   Intake/Output Summary (Last 24 hours) at 04/25/16 1136 Last data filed at 04/25/16 0840  Gross per 24 hour  Intake           144.25 ml  Output             1375 ml  Net         -1230.75 ml    PHYSICAL EXAMINATION:  GENERAL:  82 y.o.-year-old patient lying in the bed with no acute distress.  EYES: Pupils equal, round, reactive to light and accommodation. No scleral icterus. Extraocular muscles intact.  HEENT: Head atraumatic, normocephalic. Oropharynx and nasopharynx clear.  NECK:  Supple, no jugular venous distention. No thyroid enlargement, no tenderness.  LUNGS: Normal breath sounds bilaterally, no wheezing, rales,rhonchi or crepitation. No use of accessory muscles of respiration.  CARDIOVASCULAR: S1, S2 normal. No murmurs, rubs, or gallops.  ABDOMEN: Soft, non-tender, non-distended. Bowel sounds present. No organomegaly or mass.  EXTREMITIES: No pedal edema,  cyanosis, or clubbing.  NEUROLOGIC: Cranial nerves II through XII are intact. Muscle strength 5/5 in all extremities. Sensation intact. Gait not checked.  PSYCHIATRIC: The patient is alert and oriented x 3.  SKIN: No obvious rash, lesion, or ulcer.   DATA REVIEW:   CBC   Recent Labs Lab 04/25/16 1024  WBC 8.8  HGB 13.7  HCT 40.6  PLT 171    Chemistries   Recent Labs Lab 04/25/16 0616  NA 140  K 4.2  CL 108  CO2 25  GLUCOSE 96  BUN 21*  CREATININE 0.90  CALCIUM 8.7*  AST 21  ALT 23  ALKPHOS 50  BILITOT 0.6    Microbiology Results   Recent Results (from the past 240 hour(s))  Urine culture     Status: None   Collection Time: 04/24/16  8:22 AM  Result Value Ref Range Status   Specimen Description URINE, RANDOM  Final   Special Requests NONE  Final   Culture NO GROWTH Performed at [facility]   Final  Report Status 04/25/2016 FINAL  Final  MRSA PCR Screening     Status: None   Collection Time: 04/25/16  2:24 AM  Result Value Ref Range Status   MRSA by PCR NEGATIVE NEGATIVE Final    Comment:        The GeneXpert MRSA Assay (FDA approved for NASAL specimens only), is one component of a comprehensive MRSA colonization surveillance program. It is not intended to diagnose MRSA infection nor to guide or monitor treatment for MRSA infections.     RADIOLOGY:  No results found.   Management plans discussed with the patient, family and they are in agreement.  CODE STATUS:     Code Status Orders        Start     Ordered   04/25/16 0109  Do not attempt resuscitation (DNR)  Continuous    Question Answer Comment  In the event of cardiac or respiratory ARREST Do not call a "code blue"   In the event of cardiac or respiratory ARREST Do not perform Intubation, CPR, defibrillation or ACLS   In the event of cardiac or respiratory ARREST Use medication by any route, position, wound care, and other measures to relive pain and suffering. May use  oxygen, suction and manual treatment of airway obstruction as needed for comfort.      04/25/16 0108    Code Status History    Date Active Date Inactive Code Status Order ID Comments User Context   04/25/2016  1:07 AM 04/25/2016  1:08 AM Full Code [id number]  Tonye RoyaltyAlexis Hugelmeyer, DO Inpatient   05/14/2015  7:05 PM 05/15/2015  4:51 PM DNR [id number]  Altamese DillingVaibhavkumar Vachhani, MD ED    Advance Directive Documentation   Flowsheet Row Most Recent Value  Type of Advance Directive  Out of facility DNR (pink MOST or yellow form)  Pre-existing out of facility DNR order (yellow form or pink MOST form)  Yellow form placed in chart (order not valid for inpatient use)  "MOST" Form in Place?  No data      TOTAL TIME TAKING CARE OF THIS PATIENT: 40  minutes.    Hennessy Bartel M.D on 04/25/2016 at 11:36 AM  Between 7am to 6pm - Pager - [phone] After 6pm go to [email or web address] - password [email or web address]  Fabio Neighborsagle [place] [facility]  Office  [phone]  CC: Primary care physician; Pcp Not In System

## 2016-04-25 NOTE — Care Management Obs Status (Signed)
MEDICARE OBSERVATION STATUS NOTIFICATION   Patient Details  Name: Anthony Blankenship MRN: [id number] Date of Birth: March 25, 1925   Medicare Observation Status Notification Given:   code 7944 given; patient unable to sign due to parkinsons.CM signature on document    Berna BueCheryl Samil Mecham, RN 04/25/2016, 8:42 AM

## 2016-04-25 NOTE — Progress Notes (Signed)
Pt seemed exhausted and spoke in a whisper. 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  CH offered prayer. CH is available for follow up.   04/25/16 1110  Clinical Encounter Type  Visited With Patient  Visit Type Initial  Referral From Nurse  Spiritual Encounters  Spiritual Needs Prayer  Stress Factors  Patient Stress Factors Exhausted

## 2016-04-25 NOTE — Care Management CC44 (Signed)
Condition Code 44 Documentation Completed  Patient Details  Name: Anthony Blankenship MRN: [id number] Date of Birth: November 28, 1924   Condition Code 44 given:  Yes Patient signature on Condition Code 44 notice:   no signature due to patient has Parkinsons. CM signature on document. Documentation of 2 MD's agreement:  Yes Code 44 added to claim:  Yes    [name], RN 04/25/2016, 8:43 AM

## 2016-04-25 NOTE — Progress Notes (Signed)
04/25/2016 1:27 PM  Called report to receiving nurse Amber at [facility] where pt will be discharged.  Answered her questions and provided a number if additional info is required.  Bradly Chrisougherty, Athziry Millican E, RN

## 2016-04-25 NOTE — H&P (Signed)
[facility] - [place] [facility] Admission History and Physical [employer] Corporationlexis Elain Wixon, D.O.  ---------------------------------------------------------------------------------------------------------------------   PATIENT NAME: Anthony Blankenship MR#: [id number] DATE OF BIRTH: February 09, 1925 DATE OF ADMISSION: 04/24/2016 PRIMARY CARE PHYSICIAN: Pcp Not In System  REQUESTING/REFERRING PHYSICIAN: ED Dr. Lenard LancePaduchowski  CHIEF COMPLAINT: Chief Complaint  Patient presents with  . Rectal Bleeding    HISTORY OF PRESENT ILLNESS: Anthony Reapdward Savarino is a 82 y.o. male with a known history of Hypertension, coronary artery disease, Parkinson's and anxiety presents to the emergency department for evaluation of rectal bleeding.  Patient had been in the emergency department twice earlier in the day for complications with an indwelling Foley catheter however when he returned to his nursing facility he was found to have bright red blood per rectum with clots for which EMS was called. Patient denies all complaints.  Otherwise there has been no change in status. Patient has been taking medication as prescribed and there has been no recent change in medication or diet.  There has been no recent illness, travel or sick contacts.    Patient denies fevers/chills, weakness, dizziness, chest pain, shortness of breath, N/V/C/D, abdominal pain, dysuria/frequency, changes in mental status.   PAST MEDICAL HISTORY: Past Medical History:  Diagnosis Date  . Anxiety   . Coronary artery disease   . Depression   . Hypertension   . Kidney stones   . Parkinson's disease (HCC)   . Restless leg syndrome       PAST SURGICAL HISTORY: Past Surgical History:  Procedure Laterality Date  . APPENDECTOMY    . CORONARY ARTERY BYPASS GRAFT        SOCIAL HISTORY: Social History  Substance Use Topics  . Smoking status: Never Smoker  . Smokeless tobacco: Never Used  . Alcohol use No      FAMILY HISTORY: Family History   Problem Relation Age of Onset  . Liver disease Father      MEDICATIONS AT HOME: Prior to Admission medications   Medication Sig Start Date End Date Taking? Authorizing Provider  acetaminophen (TYLENOL) 325 MG tablet Take 650 mg by mouth every 6 (six) hours as needed.   Yes Historical Provider, MD  carbidopa-levodopa-entacapone (STALEVO) 50-200-200 MG tablet Take 1 tablet by mouth 4 (four) times daily.   Yes Historical Provider, MD  cephALEXin (KEFLEX) 250 MG capsule Take 1 capsule (250 mg total) by mouth 4 (four) times daily. 04/21/16 05/01/16 Yes Emily FilbertJonathan E Williams, MD  diazepam (VALIUM) 2 MG tablet Take 1 tablet (2 mg total) by mouth every 8 (eight) hours as needed for muscle spasms (restless leg, agitation). 05/18/15 05/17/16 Yes Emily FilbertJonathan E Williams, MD  levothyroxine (SYNTHROID, LEVOTHROID) 50 MCG tablet Take 50 mcg by mouth daily.    Yes Historical Provider, MD  loperamide (IMODIUM) 2 MG capsule Take 2 mg by mouth as needed for diarrhea or loose stools.   Yes Historical Provider, MD  rOPINIRole (REQUIP) 1 MG tablet Take 1 tablet (1 mg total) by mouth 3 (three) times daily. 05/15/15  Yes Enedina FinnerSona Patel, MD  traMADol (ULTRAM) 50 MG tablet Take 50 mg by mouth 2 (two) times daily.   Yes Historical Provider, MD      DRUG ALLERGIES: No Known Allergies   REVIEW OF SYSTEMS: CONSTITUTIONAL: No fatigue, weakness, fever, chills, weight gain/loss, headache EYES: No blurry or double vision. ENT: No tinnitus, postnasal drip, redness or soreness of the oropharynx. RESPIRATORY: No dyspnea, cough, wheeze, hemoptysis. CARDIOVASCULAR: No chest pain, orthopnea, palpitations, syncope. GASTROINTESTINAL: No nausea, vomiting, constipation,  diarrhea, abdominal pain. No hematemesis, melena Positive hematochezia. GENITOURINARY: No dysuria, frequency, hematuria. ENDOCRINE: No polyuria or nocturia. No heat or cold intolerance. HEMATOLOGY: No anemia, bruising, bleeding. INTEGUMENTARY: No rashes, ulcers,  lesions. MUSCULOSKELETAL: No pain, arthritis, swelling, gout. NEUROLOGIC: No numbness, tingling, weakness or ataxia. No seizure-type activity. PSYCHIATRIC: No anxiety, depression, insomnia.  PHYSICAL EXAMINATION: VITAL SIGNS: Blood pressure (!) 151/85, pulse 65, temperature 97.7 F (36.5 C), temperature source Oral, resp. rate 18, height 5\' 9"  (1.753 m), weight 65.4 kg (144 lb 1.6 oz), SpO2 91 %.  GENERAL: 82 y.o.-year-old white male patient, well-developed, well-nourished lying in the bed in no acute distress.  Pleasant and cooperative.   HEENT: Head atraumatic, normocephalic. Pupils equal, round, reactive to light and accommodation. No scleral icterus. Extraocular muscles intact. Oropharynx is clear. Mucus membranes moist. NECK: Supple, full range of motion. No JVD, no bruit heard. No cervical lymphadenopathy. CHEST: Normal breath sounds bilaterally. No wheezing, rales, rhonchi or crackles. No use of accessory muscles of respiration.  No reproducible chest wall tenderness.  CARDIOVASCULAR: S1, S2 normal. No murmurs, rubs, or gallops appreciated. Cap refill <2 seconds. ABDOMEN: Soft, nontender, nondistended. No rebound, guarding, rigidity. Normoactive bowel sounds present in all four quadrants. No organomegaly or mass. Guaiac positive stool per ED documentation EXTREMITIES: Full range of motion. No pedal edema, cyanosis, or clubbing. NEUROLOGIC: Cranial nerves II through XII are grossly intact with no focal sensorimotor deficit. Muscle strength 5/5 in all extremities. Sensation intact. Gait not checked.   LABORATORY PANEL:  CBC  Recent Labs Lab 04/24/16 2116  WBC 8.8  HGB 13.9  HCT 39.9*  PLT 160   ----------------------------------------------------------------------------------------------------------------- Chemistries  Recent Labs Lab 04/24/16 2116  NA 139  K 4.4  CL 103  CO2 28  GLUCOSE 109*  BUN 26*  CREATININE 1.03  CALCIUM 9.2  AST 25  ALT 25  ALKPHOS 56   BILITOT 0.7   ------------------------------------------------------------------------------------------------------------------ Cardiac Enzymes  Recent Labs Lab 04/21/16 0757  TROPONINI 0.03*   ------------------------------------------------------------------------------------------------------------------  RADIOLOGY: No results found.   IMPRESSION AND PLAN:  This is a 82 y.o. male with a history of Hypertension, coronary artery disease, Parkinson's and anxiety now being admitted with: 1. Lower GI bleed-admit for IV Protonix, IV fluid hydration, nothing by mouth, GI consultation. We'll monitor on telemetry hold Imodium for now. 2. Marginally elevated troponin-we'll trend troponins and monitor on telemetry 3. History of Parkinson's-continue carbidopa levodopa 4. History of restless legs-continue Requip 5. History of hypothyroidism-continue Synthroid 6. History of anxiety-continue Valium. Continue tramadol for pain control Unclear as to why the patient is on Keflex however documentation indicates that he was prescribed a dose through November 5 so we will therefore continue.  Diet/Nutrition: Nothing by mouth Fluids: IV normal saline DVT Px:: SCDs and early ambulation chemoprophylaxis will be contraindicated at this point secondary to active lower GI bleed Code Status: DO NOT RESUSCITATE reported from care center.  All the records are reviewed and case discussed with ED provider. Management plans discussed with the patient and/or family who express understanding and agree with plan of care.   TOTAL TIME TAKING CARE OF THIS PATIENT: 60 minutes.   Shawnte Demarest D.O. on 04/25/2016 at 1:33 AM Between 7am to 6pm - Pager - [phone] After 6pm go to [email or web address] - Social research officer, governmentpassword [email or web address] [facility]  Hospitalists Office [phone] CC: Primary care physician; Pcp Not In System     Note: This dictation was prepared with Dragon dictation along with smaller  phrase technology. Any transcriptional errors that  result from this process are unintentional.

## 2016-04-25 NOTE — Clinical Social Work Note (Signed)
Clinical Social Work Assessment  Patient Details  Name: Anthony Blankenship MRN: [id number] Date of Birth: 1924-07-02  Date of referral:  04/25/16               Reason for consult:  Facility Placement                Permission sought to share information with:    Permission granted to share information::     Name::        Agency::     Relationship::     Contact Information:     Housing/Transportation Living arrangements for the past 2 months:  Assisted Living Facility Source of Information:  Adult Children (Daughter: Anthony ClinesCarolyn Blankenship: [phone]) Patient Interpreter Needed:  None Criminal Activity/Legal Involvement Pertinent to Current Situation/Hospitalization:  No - Comment as needed Significant Relationships:  Adult Children Lives with:  Facility Resident Do you feel safe going back to the place where you live?  Yes Need for family participation in patient care:  No (Coment)  Care giving concerns:  Patient is a long term resident at [facility]   Social Worker assessment / plan:  CSW informed by MD that patient is to discharge to return to [facility] today. CSW contacted [facility] would normally not take someone back with a foley catheter but because he came from them with a foley catheter they will take him back. [facility] and [facility] to [facility] by EMS. CSW spoke with patient's daughter and she was in agreement.   Employment status:  Retired Health and safety inspectornsurance information:    PT Recommendations:    Information / Referral to community resources:     Patient/Family's Response to care:  Patient's daughter expressed appreciation for CSW assistance.   Patient/Family's Understanding of and Emotional Response to Diagnosis, Current Treatment, and Prognosis:  Patient's daughter stated that she felt aware of the situation as she had just spoken to Dr. Allena Blankenship.   Emotional  Assessment Appearance:  Appears stated age Attitude/Demeanor/Rapport:  Unable to Assess Affect (typically observed):  Unable to Assess Orientation:  Oriented to Self Alcohol / Substance use:  Not Applicable Psych involvement (Current and /or in the community):  No (Comment)  Discharge Needs  Concerns to be addressed:    Readmission within the last 30 days:  Yes Current discharge risk:  None Barriers to Discharge:  No Barriers Identified   York SpanielMonica Anav Lammert, LCSW 04/25/2016, 12:09 PM

## 2016-04-25 NOTE — [place] FL2 (Signed)
  [place] MEDICAID FL2 LEVEL OF CARE SCREENING TOOL     IDENTIFICATION  Patient Name: Anthony Blankenship Birthdate: [birth year] Sex: male Admission Date (Current Location): 04/24/2016  [facility] [place]   Facility and Address:  [facility], [address], [place], [place] [address]      Provider Number: [id number]  Attending Physician Name and Address:  Enedina FinnerSona Patel, MD  Relative Name and Phone Number:       Current Level of Care: Hospital Recommended Level of Care: Assisted Living Facility Prior Approval Number:    Date Approved/Denied:   PASRR Number:    Discharge Plan:  (ALF)    Current Diagnoses: Patient Active Problem List   Diagnosis Date Noted  . Lower GI bleed 04/25/2016  . GI bleed 04/25/2016  . Restlessness 05/14/2015    Orientation RESPIRATION BLADDER Height & Weight     Self, Place  Normal Incontinent, Indwelling catheter Weight: 144 lb 1.6 oz (65.4 kg) Height:  5\' 9"  (175.3 cm)  BEHAVIORAL SYMPTOMS/MOOD NEUROLOGICAL BOWEL NUTRITION STATUS   (none)  (none) Incontinent Diet (soft)  AMBULATORY STATUS COMMUNICATION OF NEEDS Skin   Supervision Verbally Normal                       Personal Care Assistance Level of Assistance  Bathing, Dressing Bathing Assistance: Limited assistance   Dressing Assistance: Limited assistance     Functional Limitations Info             SPECIAL CARE FACTORS FREQUENCY                       Contractures Contractures Info: Not present    Additional Factors Info  Code Status, Allergies Code Status Info: DNR Allergies Info: NKA           DISCHARGE MEDICATIONS:       Current Discharge Medication List        START taking these medications   Details  senna-docusate (SENOKOT-S) 8.6-50 MG tablet Take 1 tablet by mouth at bedtime as needed for mild constipation. Qty: 60 tablet, Refills: 1          CONTINUE these medications which have NOT  CHANGED   Details  acetaminophen (TYLENOL) 325 MG tablet Take 650 mg by mouth every 6 (six) hours as needed.    carbidopa-levodopa-entacapone (STALEVO) 50-200-200 MG tablet Take 1 tablet by mouth 4 (four) times daily.    cephALEXin (KEFLEX) 250 MG capsule Take 1 capsule (250 mg total) by mouth 4 (four) times daily. Qty: 40 capsule, Refills: 0    diazepam (VALIUM) 2 MG tablet Take 1 tablet (2 mg total) by mouth every 8 (eight) hours as needed for muscle spasms (restless leg, agitation). Qty: 30 tablet, Refills: 0    levothyroxine (SYNTHROID, LEVOTHROID) 50 MCG tablet Take 50 mcg by mouth daily.     rOPINIRole (REQUIP) 1 MG tablet Take 1 tablet (1 mg total) by mouth 3 (three) times daily. Qty: 90 tablet, Refills: 0    traMADol (ULTRAM) 50 MG tablet Take 50 mg by mouth 2 (two) times daily.         STOP taking these medications     loperamide (IMODIUM) 2 MG capsule           Relevant Imaging Results:  Relevant Lab Results:   Additional Information    York SpanielMonica Rahman Ferrall, LCSW

## 2016-04-25 NOTE — Discharge Instructions (Signed)
Foley care per protocol  Pt will need to f/u with URology as outpt

## 2016-04-25 NOTE — Progress Notes (Signed)
Spoke to  Dr.Hugelmeyer and order to give one time dose of Requip now  and patient to remain NPO execpt  Meds; Will continue to monitor.

## 2016-05-03 ENCOUNTER — Encounter: Payer: Self-pay | Admitting: Emergency Medicine

## 2016-05-03 ENCOUNTER — Emergency Department
Admission: EM | Admit: 2016-05-03 | Discharge: 2016-05-03 | Disposition: A | Discharge status: Home / Self Care | Payer: Medicare Other | Attending: Emergency Medicine | Admitting: Emergency Medicine

## 2016-05-03 ENCOUNTER — Emergency Department: Payer: Medicare Other

## 2016-05-03 DIAGNOSIS — Z951 Presence of aortocoronary bypass graft: Secondary | ICD-10-CM | POA: Insufficient documentation

## 2016-05-03 DIAGNOSIS — S0031XA Abrasion of nose, initial encounter: Secondary | ICD-10-CM | POA: Diagnosis not present

## 2016-05-03 DIAGNOSIS — Y939 Activity, unspecified: Secondary | ICD-10-CM | POA: Diagnosis not present

## 2016-05-03 DIAGNOSIS — I10 Essential (primary) hypertension: Secondary | ICD-10-CM | POA: Diagnosis not present

## 2016-05-03 DIAGNOSIS — W06XXXA Fall from bed, initial encounter: Secondary | ICD-10-CM | POA: Diagnosis not present

## 2016-05-03 DIAGNOSIS — Z79899 Other long term (current) drug therapy: Secondary | ICD-10-CM | POA: Diagnosis not present

## 2016-05-03 DIAGNOSIS — T148XXA Other injury of unspecified body region, initial encounter: Secondary | ICD-10-CM

## 2016-05-03 DIAGNOSIS — G20 Parkinson's disease: Secondary | ICD-10-CM | POA: Diagnosis not present

## 2016-05-03 DIAGNOSIS — Y999 Unspecified external cause status: Secondary | ICD-10-CM | POA: Diagnosis not present

## 2016-05-03 DIAGNOSIS — Y929 Unspecified place or not applicable: Secondary | ICD-10-CM | POA: Insufficient documentation

## 2016-05-03 DIAGNOSIS — S0081XA Abrasion of other part of head, initial encounter: Secondary | ICD-10-CM | POA: Insufficient documentation

## 2016-05-03 DIAGNOSIS — I2510 Atherosclerotic heart disease of native coronary artery without angina pectoris: Secondary | ICD-10-CM | POA: Insufficient documentation

## 2016-05-03 DIAGNOSIS — S0990XA Unspecified injury of head, initial encounter: Secondary | ICD-10-CM | POA: Diagnosis present

## 2016-05-03 DIAGNOSIS — W19XXXA Unspecified fall, initial encounter: Secondary | ICD-10-CM

## 2016-05-03 DIAGNOSIS — Z791 Long term (current) use of non-steroidal anti-inflammatories (NSAID): Secondary | ICD-10-CM | POA: Diagnosis not present

## 2016-05-03 LAB — CBC WITH DIFFERENTIAL/PLATELET
BASOS ABS: 0 10*3/uL (ref 0–0.1)
BASOS PCT: 1 %
EOS ABS: 0.1 10*3/uL (ref 0–0.7)
EOS PCT: 1 %
HCT: 35 % — ABNORMAL LOW (ref 40.0–52.0)
HEMOGLOBIN: 11.9 g/dL — AB (ref 13.0–18.0)
Lymphocytes Relative: 18 %
Lymphs Abs: 1.4 10*3/uL (ref 1.0–3.6)
MCH: 31.5 pg (ref 26.0–34.0)
MCHC: 34.1 g/dL (ref 32.0–36.0)
MCV: 92.4 fL (ref 80.0–100.0)
Monocytes Absolute: 0.6 10*3/uL (ref 0.2–1.0)
Monocytes Relative: 7 %
NEUTROS PCT: 73 %
Neutro Abs: 5.8 10*3/uL (ref 1.4–6.5)
PLATELETS: 168 10*3/uL (ref 150–440)
RBC: 3.79 MIL/uL — AB (ref 4.40–5.90)
RDW: 12.9 % (ref 11.5–14.5)
WBC: 7.9 10*3/uL (ref 3.8–10.6)

## 2016-05-03 LAB — BASIC METABOLIC PANEL
ANION GAP: 5 (ref 5–15)
BUN: 30 mg/dL — AB (ref 6–20)
CHLORIDE: 105 mmol/L (ref 101–111)
CO2: 29 mmol/L (ref 22–32)
Calcium: 8.5 mg/dL — ABNORMAL LOW (ref 8.9–10.3)
Creatinine, Ser: 1.21 mg/dL (ref 0.61–1.24)
GFR, EST AFRICAN AMERICAN: 59 mL/min — AB (ref >60)
GFR, EST NON AFRICAN AMERICAN: 51 mL/min — AB (ref >60)
Glucose, Bld: 101 mg/dL — ABNORMAL HIGH (ref 65–99)
POTASSIUM: 4.2 mmol/L (ref 3.5–5.1)
SODIUM: 139 mmol/L (ref 135–145)

## 2016-05-03 LAB — TROPONIN I

## 2016-05-03 LAB — URINALYSIS COMPLETE WITH MICROSCOPIC (ARMC ONLY)
BILIRUBIN URINE: NEGATIVE
Bacteria, UA: NONE SEEN
GLUCOSE, UA: NEGATIVE mg/dL
LEUKOCYTES UA: NEGATIVE
NITRITE: NEGATIVE
Protein, ur: 30 mg/dL — AB
SPECIFIC GRAVITY, URINE: 1.02 (ref 1.005–1.030)
SQUAMOUS EPITHELIAL / LPF: NONE SEEN
pH: 5 (ref 5.0–8.0)

## 2016-05-03 NOTE — ED Notes (Signed)
Attempted to contact the [facility] [place], no answered.

## 2016-05-03 NOTE — ED Provider Notes (Signed)
[facility] Emergency Department Provider Note   ____________________________________________   I have reviewed the triage vital signs and the nursing notes.   HISTORY  Chief Complaint Fall   History limited by: Not Limited   HPI Anthony Blankenship is a 82 y.o. male from living facility via EMS after unwitnessed fall. Patient states he fell off the bed but is unsure why he fell. He states he hit is forehead. Only complaining of a little pain around the site of his abrasions on the forehead and nose. No neck pain. He denies any recent chest pain, shortness of breath or fevers. Denies any pain anywhere else or injuries from the fall.   Past Medical History:  Diagnosis Date  . Anxiety   . Coronary artery disease   . Depression   . Hypertension   . Kidney stones   . Parkinson's disease (HCC)   . Restless leg syndrome     Patient Active Problem List   Diagnosis Date Noted  . Lower GI bleed 04/25/2016  . GI bleed 04/25/2016  . Restlessness 05/14/2015    Past Surgical History:  Procedure Laterality Date  . APPENDECTOMY    . CORONARY ARTERY BYPASS GRAFT      Prior to Admission medications   Medication Sig Start Date End Date Taking? Authorizing Provider  acetaminophen (TYLENOL) 325 MG tablet Take 650 mg by mouth every 6 (six) hours as needed.   Yes Historical Provider, MD  carbidopa-levodopa-entacapone (STALEVO) 50-200-200 MG tablet Take 1 tablet by mouth 4 (four) times daily.   Yes Historical Provider, MD  diazepam (VALIUM) 2 MG tablet Take 1 tablet (2 mg total) by mouth every 8 (eight) hours as needed for muscle spasms (restless leg, agitation). 05/18/15 05/17/16 Yes Emily FilbertJonathan E Williams, MD  levothyroxine (SYNTHROID, LEVOTHROID) 50 MCG tablet Take 50 mcg by mouth daily.    Yes Historical Provider, MD  rOPINIRole (REQUIP) 1 MG tablet Take 1 tablet (1 mg total) by mouth 3 (three) times daily. 05/15/15  Yes Enedina FinnerSona Patel, MD  senna-docusate (SENOKOT-S)  8.6-50 MG tablet Take 1 tablet by mouth at bedtime as needed for mild constipation. 04/25/16  Yes Enedina FinnerSona Patel, MD  traMADol (ULTRAM) 50 MG tablet Take 50 mg by mouth 2 (two) times daily.   Yes Historical Provider, MD    Allergies Patient has no known allergies.  Family History  Problem Relation Age of Onset  . Liver disease Father     Social History Social History  Substance Use Topics  . Smoking status: Never Smoker  . Smokeless tobacco: Never Used  . Alcohol use No    Review of Systems  Constitutional: Negative for fever. Cardiovascular: Negative for chest pain. Respiratory: Negative for shortness of breath. Gastrointestinal: Negative for abdominal pain, vomiting and diarrhea. Genitourinary: Negative for dysuria. Musculoskeletal: Negative for back pain. Skin: Positive for abrasions. Neurological: Negative for headaches, focal weakness or numbness.  10-point ROS otherwise negative.  ____________________________________________   PHYSICAL EXAM:  VITAL SIGNS: ED Triage Vitals  Enc Vitals Group     BP 05/03/16 0316 (!) 149/74     Pulse Rate 05/03/16 0316 (!) 58     Resp 05/03/16 0316 14     Temp 05/03/16 0316 98.3 F (36.8 C)     Temp Source 05/03/16 0316 Oral     SpO2 05/03/16 0316 100 %     Weight 05/03/16 0317 144 lb (65.3 kg)     Height 05/03/16 0317 5\' 9"  (1.753 m)   Constitutional:  Alert and oriented. Well appearing and in no distress. Eyes: Conjunctivae are normal. Normal extraocular movements. ENT   Head: Normocephalic. Small roughly 1 cm diameter abrasion to forehead and bridge of nose.    Nose: No congestion/rhinnorhea.   Mouth/Throat: Mucous membranes are moist.   Neck: No stridor. No midline tenderness.  Hematological/Lymphatic/Immunilogical: No cervical lymphadenopathy. Cardiovascular: Normal rate, regular rhythm.  No murmurs, rubs, or gallops. Respiratory: Normal respiratory effort without tachypnea nor retractions. Breath sounds are  clear and equal bilaterally. No wheezes/rales/rhonchi. Gastrointestinal: Soft and nontender. No distention.  Genitourinary: Deferred Musculoskeletal: Normal range of motion in all extremities. No lower extremity edema. Pelvis stable. No tenderness to palpation or rotation of hip. Neurologic:  Normal speech and language. No gross focal neurologic deficits are appreciated.  Skin:  Skin is warm, dry. Abrasion to forehead and bridge of nose.  Psychiatric: Mood and affect are normal. Speech and behavior are normal. Patient exhibits appropriate insight and judgment.  ____________________________________________    LABS (pertinent positives/negatives)  Labs Reviewed  CBC WITH DIFFERENTIAL/PLATELET - Abnormal; Notable for the following:       Result Value   RBC 3.79 (*)    Hemoglobin 11.9 (*)    HCT 35.0 (*)    All other components within normal limits  BASIC METABOLIC PANEL - Abnormal; Notable for the following:    Glucose, Bld 101 (*)    BUN 30 (*)    Calcium 8.5 (*)    GFR calc non Af Amer 51 (*)    GFR calc Af Amer 59 (*)    All other components within normal limits  URINALYSIS COMPLETEWITH MICROSCOPIC ([facility] ONLY) - Abnormal; Notable for the following:    Color, Urine YELLOW (*)    APPearance CLEAR (*)    Ketones, ur TRACE (*)    Hgb urine dipstick 3+ (*)    Protein, ur 30 (*)    All other components within normal limits  TROPONIN I     ____________________________________________   EKG  I, Phineas SemenGraydon Laikynn Pollio, attending physician, personally viewed and interpreted this EKG  EKG Time: 0314 Rate: 59 Rhythm: normal sinus rhythm Axis: normal Intervals: qtc 441 QRS: RBBB ST changes: no st elevation Impression: abnormal ekg   ____________________________________________    RADIOLOGY  CT head/cervical spine IMPRESSION:  1. No acute intracranial abnormality.  2. No acute fracture or static subluxation of the cervical spine.     ____________________________________________   PROCEDURES  Procedures  ____________________________________________   INITIAL IMPRESSION / ASSESSMENT AND PLAN / ED COURSE  Pertinent labs & imaging results that were available during my care of the patient were reviewed by me and considered in my medical decision making (see chart for details).  Patient here after a fall. Workup without any concerning acute findings. Will discharge back to living facility. ____________________________________________   FINAL CLINICAL IMPRESSION(S) / ED DIAGNOSES  Final diagnoses:  Fall, initial encounter  Abrasion     Note: This dictation was prepared with Dragon dictation. Any transcriptional errors that result from this process are unintentional    Phineas SemenGraydon Aadhya Bustamante, MD 05/03/16 0559

## 2016-05-03 NOTE — Discharge Instructions (Signed)
Please seek medical attention for any high fevers, chest pain, shortness of breath, change in behavior, persistent vomiting, bloody stool or any other new or concerning symptoms.  

## 2016-05-03 NOTE — ED Notes (Signed)
Patient transported to CT 

## 2016-05-03 NOTE — ED Triage Notes (Signed)
Pt presents to ED via EMS from the [facility] [place] to evaluated for fall. Pt found on floor with facial abrasions on nose and forehead. Bruise noted on both knees.

## 2016-05-03 NOTE — ED Notes (Signed)
Pt discharged to facility with copy of discharge instructions; nad noted.

## 2016-05-03 NOTE — ED Notes (Signed)
Yellow arm band and yellow non-slippers sucks placed.

## 2016-05-09 ENCOUNTER — Emergency Department
Admission: EM | Admit: 2016-05-09 | Discharge: 2016-05-09 | Disposition: A | Discharge status: Home / Self Care | Payer: Medicare Other | Attending: Emergency Medicine | Admitting: Emergency Medicine

## 2016-05-09 ENCOUNTER — Encounter: Payer: Self-pay | Admitting: Emergency Medicine

## 2016-05-09 DIAGNOSIS — G20 Parkinson's disease: Secondary | ICD-10-CM | POA: Insufficient documentation

## 2016-05-09 DIAGNOSIS — Z79899 Other long term (current) drug therapy: Secondary | ICD-10-CM | POA: Insufficient documentation

## 2016-05-09 DIAGNOSIS — I2510 Atherosclerotic heart disease of native coronary artery without angina pectoris: Secondary | ICD-10-CM | POA: Insufficient documentation

## 2016-05-09 DIAGNOSIS — Z951 Presence of aortocoronary bypass graft: Secondary | ICD-10-CM | POA: Diagnosis not present

## 2016-05-09 DIAGNOSIS — T83098A Other mechanical complication of other indwelling urethral catheter, initial encounter: Secondary | ICD-10-CM | POA: Insufficient documentation

## 2016-05-09 DIAGNOSIS — I10 Essential (primary) hypertension: Secondary | ICD-10-CM | POA: Insufficient documentation

## 2016-05-09 DIAGNOSIS — Y732 Prosthetic and other implants, materials and accessory gastroenterology and urology devices associated with adverse incidents: Secondary | ICD-10-CM | POA: Insufficient documentation

## 2016-05-09 DIAGNOSIS — T839XXA Unspecified complication of genitourinary prosthetic device, implant and graft, initial encounter: Secondary | ICD-10-CM

## 2016-05-09 LAB — URINALYSIS COMPLETE WITH MICROSCOPIC (ARMC ONLY)
BILIRUBIN URINE: NEGATIVE
Bacteria, UA: NONE SEEN
Glucose, UA: NEGATIVE mg/dL
KETONES UR: NEGATIVE mg/dL
LEUKOCYTES UA: NEGATIVE
NITRITE: NEGATIVE
PH: 7 (ref 5.0–8.0)
Protein, ur: 30 mg/dL — AB
SPECIFIC GRAVITY, URINE: 1.015 (ref 1.005–1.030)

## 2016-05-09 MED ORDER — ROPINIROLE HCL 1 MG PO TABS
1.0000 mg | ORAL_TABLET | Freq: Once | ORAL | Status: AC
  Administered 2016-05-09: 1 mg via ORAL
  Filled 2016-05-09: qty 1

## 2016-05-09 NOTE — ED Provider Notes (Signed)
[facility] Emergency Department Provider Note   ____________________________________________   First MD Initiated Contact with Patient 05/09/16 0915     (approximate)  I have reviewed the triage vital signs and the nursing notes.   HISTORY  Chief Complaint Catheter Problems (urinary)   HPI Anthony Blankenship is a 82 y.o. male who comes in from the nursing home. He had a Foley catheter placed over 2 weeks ago because of urinary retention of greater than a liter. Patient continues to pull on his Foley catheter. He has blood in the catheter and it catheter is leaking urine around it. Patient complains of a lot of pain from the catheter. Patient has urology appointment tomorrow reportedly. We will remove the catheter and see how the patient does. I explained to him if he is unable to pass urine he may need to have the Foley replaced. I told him we would give him numbing medicine before we did this. Patient reports his pain is much better once the catheter is removed. Past Medical History:  Diagnosis Date  . Anxiety   . Coronary artery disease   . Depression   . Hypertension   . Kidney stones   . Parkinson's disease (HCC)   . Restless leg syndrome     Patient Active Problem List   Diagnosis Date Noted  . Lower GI bleed 04/25/2016  . GI bleed 04/25/2016  . Restlessness 05/14/2015    Past Surgical History:  Procedure Laterality Date  . APPENDECTOMY    . CORONARY ARTERY BYPASS GRAFT      Prior to Admission medications   Medication Sig Start Date End Date Taking? Authorizing Provider  acetaminophen (TYLENOL) 325 MG tablet Take 650 mg by mouth every 6 (six) hours as needed.    Historical Provider, MD  carbidopa-levodopa-entacapone (STALEVO) 50-200-200 MG tablet Take 1 tablet by mouth 4 (four) times daily.    Historical Provider, MD  diazepam (VALIUM) 2 MG tablet Take 1 tablet (2 mg total) by mouth every 8 (eight) hours as needed for muscle spasms  (restless leg, agitation). 05/18/15 05/17/16  Emily FilbertJonathan E Williams, MD  levothyroxine (SYNTHROID, LEVOTHROID) 50 MCG tablet Take 50 mcg by mouth daily.     Historical Provider, MD  rOPINIRole (REQUIP) 1 MG tablet Take 1 tablet (1 mg total) by mouth 3 (three) times daily. 05/15/15   Enedina FinnerSona Patel, MD  senna-docusate (SENOKOT-S) 8.6-50 MG tablet Take 1 tablet by mouth at bedtime as needed for mild constipation. 04/25/16   Enedina FinnerSona Patel, MD  traMADol (ULTRAM) 50 MG tablet Take 50 mg by mouth 2 (two) times daily.    Historical Provider, MD    Allergies Patient has no known allergies.  Family History  Problem Relation Age of Onset  . Liver disease Father     Social History Social History  Substance Use Topics  . Smoking status: Never Smoker  . Smokeless tobacco: Never Used  . Alcohol use No    Review of Systems Constitutional: No fever/chills Eyes: No visual changes. ENT: No sore throat. Cardiovascular: Denies chest pain. Respiratory: Denies shortness of breath. Gastrointestinal: No abdominal pain.  No nausea, no vomiting.  No diarrhea.  No constipation. Genitourinary: See history of present illness  10-point ROS otherwise negative.  ____________________________________________   PHYSICAL EXAM:  VITAL SIGNS: ED Triage Vitals  Enc Vitals Group     BP 05/09/16 0904 125/66     Pulse Rate 05/09/16 0904 (!) 56     Resp 05/09/16 0904 18  Temp 05/09/16 0904 98 F (36.7 C)     Temp Source 05/09/16 0904 Oral     SpO2 05/09/16 0904 100 %     Weight 05/09/16 0904 150 lb (68 kg)     Height 05/09/16 0904 5\' 9"  (1.753 m)     Head Circumference --      Peak Flow --      Pain Score 05/09/16 0905 10     Pain Loc --      Pain Edu? --      Excl. in GC? --     Constitutional: Alert and oriented. Well appearing and in no acute distress. Eyes: Conjunctivae are normal. PERRL. EOMI. Head: Atraumatic. Nose: No congestion/rhinnorhea. Mouth/Throat: Mucous membranes are moist.  Oropharynx  non-erythematous. Neck: No stridor.   Cardiovascular: Normal rate, regular rhythm.Peri [place].  Good peripheral circulation. Respiratory: Normal respiratory effort.  No retractions.  Gastrointestinal: Soft and nontender. No distention. No abdominal bruits. No CVA tenderness. Genitourinary: Normal-appearing circumcised penis once the catheter is removed. There is a small amount of bruising at the tip of the penis. Musculoskeletal: No lower extremity tenderness nor edema.  No joint effusions.   ____________________________________________   LABS (all labs ordered are listed, but only abnormal results are displayed)  Labs Reviewed  URINALYSIS COMPLETEWITH MICROSCOPIC ([facility] ONLY) - Abnormal; Notable for the following:       Result Value   Color, Urine AMBER (*)    APPearance CLEAR (*)    Hgb urine dipstick 2+ (*)    Protein, ur 30 (*)    Squamous Epithelial / LPF 0-5 (*)    All other components within normal limits   ____________________________________________  EKG   ____________________________________________  RADIOLOGY  ____________________________________________   PROCEDURES  Procedure(s) performed:   Procedures  Critical Care performed:   ____________________________________________   INITIAL IMPRESSION / ASSESSMENT AND PLAN / ED COURSE  Pertinent labs & imaging results that were available during my care of the patient were reviewed by me and considered in my medical decision making (see chart for details).    Clinical Course   Catheter removed patient feels better. Bladder scan done shows 650 cc of urine present. Patient however continues to urinate in the diaper and this goes down to 350 cc. Patient is still urinating occasionally in his diaper. I will discharge the patient active to the care home and he has his follow-up appointment tomorrow with urology. ____________________________________________   FINAL CLINICAL IMPRESSION(S) / ED DIAGNOSES  Final  diagnoses:  Foley catheter problem, initial encounter [facility](HCC)      NEW MEDICATIONS STARTED DURING THIS VISIT:  New Prescriptions   No medications on file     Note:  This document was prepared using Dragon voice recognition software and may include unintentional dictation errors.    Arnaldo NatalPaul F Emika Tiano, MD 05/09/16 [phone]

## 2016-05-09 NOTE — ED Notes (Signed)
This RN spoke with staff member at [facility] and catheter was placed two weeks ago and pt was to have an appt tomorrow with urology. Staff states that pt has been pulling at catheter and will not leave alone, strong urine smell when assessed.

## 2016-05-09 NOTE — ED Triage Notes (Signed)
Pt to ed via [facility] from the [facility] with reports from staff that he is leaking around his foley catheter that was supposedly placed here last week. Pt does not know why they placed the catheter and reports pain around the insertion site of the catheter. Nad. Pt a/ox3. Pt with parkinsons. Ems reports all vss.

## 2016-05-09 NOTE — ED Notes (Signed)
Pt awaiting transport back to [facility].

## 2016-05-09 NOTE — ED Notes (Addendum)
Pt was incontinent of urine and will scan bladder again. edp aware. Pt currently has diaper in place.

## 2016-05-09 NOTE — Discharge Instructions (Addendum)
We have removed the patient's Foley catheter. Please monitor him. If he develops abdominal pain or fever please return him to the emergency room. Please see that he keeps his urology appointment that he has for tomorrow. Please return for any further problems.

## 2016-05-09 NOTE — ED Notes (Addendum)
Pt cleaned and new brief applied prior to DC. Anthony Blankenship came and escorted pt back to the [facility]. Pt went out in wheelchair. Pt given chocolate milk to go home with. Pt and caregiver informed that he needs to go to his urology appt tomorrow.

## 2016-05-10 ENCOUNTER — Ambulatory Visit (INDEPENDENT_AMBULATORY_CARE_PROVIDER_SITE_OTHER): Payer: Medicare Other | Admitting: Urology

## 2016-05-10 ENCOUNTER — Encounter: Payer: Self-pay | Admitting: Urology

## 2016-05-10 VITALS — BP 93/58 | HR 61 | Ht 69.0 in | Wt 148.6 lb

## 2016-05-10 DIAGNOSIS — K5900 Constipation, unspecified: Secondary | ICD-10-CM

## 2016-05-10 DIAGNOSIS — R339 Retention of urine, unspecified: Secondary | ICD-10-CM | POA: Diagnosis not present

## 2016-05-10 LAB — BLADDER SCAN AMB NON-IMAGING: SCAN RESULT: 0

## 2016-05-10 NOTE — Progress Notes (Signed)
05/10/2016 1:03 PM   Anthony HomansEdward C Blankenship 1925/01/01 [id number]  Referring provider: Florentina JennyHenry Tripp, MD [phone] [address] [place], [place] [id number]  Chief Complaint  Patient presents with  . New Patient (Initial Visit)    urinary retention referred by ER patient pulled catheter out yesteday and was in the ER again    HPI: Patient is an 82 year old Caucasian male with Parkinson who presents today referred by [facility]'s ED for urinary retention.  Patient is a poor historian and his aide, [name], has accompanied him at today's visit.  She is not aware of his previous history.   Patient states that he had not had issues with urination prior to presenting to the ED on 10/26 after not voiding for three days.  The few days prior to his retention, he states he was having difficulty starting his urinary stream.    In the ED, a Foley was placed and greater than 1000 mL of urine was obtained.  He returned to the ED three days later complaining of penile pain.  Foley was draining and PVR was 30 cc.    The patient was continually tugging on the catheter and asking for it to be removed.  On 05/09/2016, the Foley was removed in the ED.    Patient was also admitted a few days prior to this for rectal bleeding after passing a large BM.    Today, the patient states he feels fine.  He has not had fevers, chills, nausea or vomiting.  His PVR is 0 mL.     PMH: Past Medical History:  Diagnosis Date  . Anxiety   . Coronary artery disease   . Depression   . Hypertension   . Kidney stones   . Parkinson's disease (HCC)   . Restless leg syndrome     Surgical History: Past Surgical History:  Procedure Laterality Date  . APPENDECTOMY    . CORONARY ARTERY BYPASS GRAFT      Home Medications:    Medication List       Accurate as of 05/10/16  1:03 PM. Always use your most recent med list.          acetaminophen 325 MG tablet Commonly known as:  TYLENOL Take 650 mg by mouth every 6  (six) hours as needed.   carbidopa-levodopa-entacapone 50-200-200 MG tablet Commonly known as:  STALEVO Take 1 tablet by mouth 4 (four) times daily.   diazepam 2 MG tablet Commonly known as:  VALIUM Take 1 tablet (2 mg total) by mouth every 8 (eight) hours as needed for muscle spasms (restless leg, agitation).   levothyroxine 50 MCG tablet Commonly known as:  SYNTHROID, LEVOTHROID Take 50 mcg by mouth daily.   loperamide 2 MG tablet Commonly known as:  IMODIUM A-D Take 2 mg by mouth 4 (four) times daily as needed for diarrhea or loose stools.   rOPINIRole 1 MG tablet Commonly known as:  REQUIP Take 1 tablet (1 mg total) by mouth 3 (three) times daily.   senna-docusate 8.6-50 MG tablet Commonly known as:  Senokot-S Take 1 tablet by mouth at bedtime as needed for mild constipation.   traMADol 50 MG tablet Commonly known as:  ULTRAM Take 50 mg by mouth 2 (two) times daily.       Allergies:  Allergies  Allergen Reactions  . Other Other (See Comments)    Uncoded Allergy. Allergen: Sleeping Medications    Family History: Family History  Problem Relation Age of  Onset  . Liver disease Father   . Kidney disease Neg Hx   . Prostate cancer Neg Hx     Social History:  reports that he has never smoked. He has never used smokeless tobacco. He reports that he does not drink alcohol or use drugs.  ROS: UROLOGY Frequent Urination?: No Hard to postpone urination?: Yes Burning/pain with urination?: Yes Get up at night to urinate?: No Leakage of urine?: No Urine stream starts and stops?: No Trouble starting stream?: Yes Do you have to strain to urinate?: Yes Blood in urine?: No Urinary tract infection?: No Sexually transmitted disease?: No Injury to kidneys or bladder?: No Painful intercourse?: No Weak stream?: No Erection problems?: No Penile pain?: No  Gastrointestinal Nausea?: No Vomiting?: No Indigestion/heartburn?: No Diarrhea?: No Constipation?:  No  Constitutional Fever: No Night sweats?: No Weight loss?: Yes Fatigue?: No  Skin Skin rash/lesions?: No Itching?: No  Eyes Blurred vision?: No Double vision?: No  Ears/Nose/Throat Sore throat?: No Sinus problems?: No  Hematologic/Lymphatic Swollen glands?: No Easy bruising?: No  Cardiovascular Leg swelling?: No Chest pain?: No  Respiratory Cough?: No Shortness of breath?: No  Endocrine Excessive thirst?: No  Musculoskeletal Back pain?: No Joint pain?: No  Neurological Headaches?: No Dizziness?: No  Psychologic Depression?: Yes Anxiety?: No  Physical Exam: BP (!) 93/58   Pulse 61   Ht 5\' 9"  (1.753 m)   Wt 148 lb 9.6 oz (67.4 kg)   BMI 21.94 kg/m   Constitutional: Well nourished. Alert and oriented, No acute distress. HEENT: [place] AT, moist mucus membranes. Trachea midline, no masses. Cardiovascular: No clubbing, cyanosis, or edema. Respiratory: Normal respiratory effort, no increased work of breathing. GI: Abdomen is soft, non tender, non distended, no abdominal masses. Liver and spleen not palpable.  No hernias appreciated.  Stool sample for occult testing is not indicated.   GU: No CVA tenderness.  No bladder fullness or masses.  Patient with circumcised phallus. Urethral meatus is patent.  No penile discharge. No penile lesions or rashes. Scrotum without lesions, cysts, rashes and/or edema.  Testicles are located scrotally bilaterally. No masses are appreciated in the testicles. Left and right epididymis are normal. Rectal: Patient with decreased sphincter tone. Anus and perineum without scarring or rashes. No rectal masses are appreciated. Prostate is approximately 35 grams, no nodules are appreciated. Seminal vesicles are normal. Skin: No rashes, bruises or suspicious lesions. Lymph: No cervical or inguinal adenopathy. Neurologic: Grossly intact, no focal deficits, moving all 4 extremities. Psychiatric: Normal mood and affect.  Laboratory  Data: Lab Results  Component Value Date   WBC 7.9 05/03/2016   HGB 11.9 (L) 05/03/2016   HCT 35.0 (L) 05/03/2016   MCV 92.4 05/03/2016   PLT 168 05/03/2016    Lab Results  Component Value Date   CREATININE 1.21 05/03/2016    Lab Results  Component Value Date   TSH 4.970 (H) 05/14/2015    Lab Results  Component Value Date   AST 21 04/25/2016   Lab Results  Component Value Date   ALT 23 04/25/2016    Pertinent Imaging: Results for Anthony HomansMCDANIEL, Torie C (MRN [id number]) as of 05/10/2016 16:41  Ref. Range 05/10/2016 16:41  Scan Result Unknown 0    Assessment & Plan:   1. Urinary retention  -return if unable to urinate or experiencing suprapubic discomfort  -follow-up in one month for I PSS score and PVR   - BLADDER SCAN AMB NON-IMAGING  2. Constipation  - likely the cause of urinary  retention  - continue good bowel management   Return in about 1 month (around 06/09/2016) for IPSS and PVR.  These notes generated with voice recognition software. I apologize for typographical errors.  Michiel Cowboy, PA-C  [facility] [address] [place], [place] [phone]

## 2016-06-01 ENCOUNTER — Ambulatory Visit (INDEPENDENT_AMBULATORY_CARE_PROVIDER_SITE_OTHER): Payer: Medicare Other | Admitting: Urology

## 2016-06-01 ENCOUNTER — Encounter: Payer: Self-pay | Admitting: Urology

## 2016-06-01 VITALS — BP 103/66 | HR 51 | Ht 69.0 in | Wt 148.1 lb

## 2016-06-01 DIAGNOSIS — K5900 Constipation, unspecified: Secondary | ICD-10-CM | POA: Diagnosis not present

## 2016-06-01 DIAGNOSIS — R339 Retention of urine, unspecified: Secondary | ICD-10-CM

## 2016-06-01 LAB — BLADDER SCAN AMB NON-IMAGING: Scan Result: 35

## 2016-06-01 NOTE — Progress Notes (Signed)
06/01/2016 11:57 AM   Anthony HomansEdward C Blankenship 1925-05-20 [id number]  Referring provider: No referring provider defined for this encounter.  Chief Complaint  Patient presents with  . Urinary Retention    1 month follow up    HPI: Patient is an 82 year old Caucasian male with Parkinson's disease who presents today for 1 month follow-up after an episode of acute urinary retention believed to be caused by constipation.  Background history Patient is an 82 year old Caucasian male with Parkinson who presents today [facility] ED for urinary retention.   Patient is a poor historian and his aide, [name], has accompanied him at today's visit.  She is not aware of his previous history.  Patient states that he had not had issues with urination prior to presenting to the ED on 10/26 after not voiding for three days.  The few days prior to his retention, he states he was having difficulty starting his urinary stream.   In the ED, a Foley was placed and greater than 1000 mL of urine was obtained.  He returned to the ED three days later complaining of penile pain.  Foley was draining and PVR was 30 cc.  The patient was continually tugging on the catheter and asking for it to be removed.  On 05/09/2016, the Foley was removed in the ED.  Patient was also admitted a few days prior to this for rectal bleeding after passing a large BM.  His PVR was 0 mL.    His IPSS score today is 9, which is moderate lower urinary tract symptomatology. He is mostly satisfied with his quality life due to his urinary symptoms.  His PVR is 35 mL.  His major complaint today nocturia x 4.  He has had these symptoms for several years.  He denies any dysuria, hematuria or suprapubic pain.   He also denies any recent fevers, chills, nausea or vomiting.   he does not have a family history of PCa.      IPSS    Row Name 06/01/16 1100         International Prostate Symptom Score   How often have you had the sensation of not  emptying your bladder? Not at All     How often have you had to urinate less than every two hours? Not at All     How often have you found you stopped and started again several times when you urinated? Not at All     How often have you found it difficult to postpone urination? More than half the time     How often have you had a weak urinary stream? Not at All     How often have you had to strain to start urination? Not at All     How many times did you typically get up at night to urinate? 5 Times     Total IPSS Score 9       Quality of Life due to urinary symptoms   If you were to spend the rest of your life with your urinary condition just the way it is now how would you feel about that? Mostly Satisfied        Score:  1-7 Mild 8-19 Moderate 20-35 Severe    PMH: Past Medical History:  Diagnosis Date  . Anxiety   . Coronary artery disease   . Depression   . Hypertension   . Kidney stones   . Parkinson's disease (HCC)   .  Restless leg syndrome     Surgical History: Past Surgical History:  Procedure Laterality Date  . APPENDECTOMY    . CORONARY ARTERY BYPASS GRAFT      Home Medications:    Medication List       Accurate as of 06/01/16 11:57 AM. Always use your most recent med list.          acetaminophen 325 MG tablet Commonly known as:  TYLENOL Take 650 mg by mouth every 6 (six) hours as needed.   carbidopa-levodopa-entacapone 50-200-200 MG tablet Commonly known as:  STALEVO Take 1 tablet by mouth 4 (four) times daily.   diazepam 2 MG tablet Commonly known as:  VALIUM Take 2 mg by mouth every 12 (twelve) hours as needed for anxiety.   levothyroxine 50 MCG tablet Commonly known as:  SYNTHROID, LEVOTHROID Take 50 mcg by mouth daily.   loperamide 2 MG tablet Commonly known as:  IMODIUM A-D Take 2 mg by mouth 4 (four) times daily as needed for diarrhea or loose stools.   rOPINIRole 1 MG tablet Commonly known as:  REQUIP Take 1 tablet (1 mg total) by  mouth 3 (three) times daily.   senna-docusate 8.6-50 MG tablet Commonly known as:  Senokot-S Take 1 tablet by mouth at bedtime as needed for mild constipation.   traMADol 50 MG tablet Commonly known as:  ULTRAM Take 50 mg by mouth 2 (two) times daily.       Allergies:  Allergies  Allergen Reactions  . Other Other (See Comments)    Uncoded Allergy. Allergen: Sleeping Medications    Family History: Family History  Problem Relation Age of Onset  . Liver disease Father   . Kidney disease Neg Hx   . Prostate cancer Neg Hx   . Bladder Cancer Neg Hx     Social History:  reports that he has never smoked. He has never used smokeless tobacco. He reports that he does not drink alcohol or use drugs.  ROS: UROLOGY Frequent Urination?: No Hard to postpone urination?: No Burning/pain with urination?: No Get up at night to urinate?: No Leakage of urine?: No Urine stream starts and stops?: No Trouble starting stream?: No Do you have to strain to urinate?: No Blood in urine?: No Urinary tract infection?: No Sexually transmitted disease?: No Injury to kidneys or bladder?: No Painful intercourse?: No Weak stream?: No Erection problems?: No Penile pain?: No  Gastrointestinal Nausea?: No Vomiting?: No Indigestion/heartburn?: No Diarrhea?: No Constipation?: No  Constitutional Fever: No Night sweats?: No Weight loss?: No Fatigue?: Yes  Skin Skin rash/lesions?: No Itching?: No  Eyes Blurred vision?: No Double vision?: Yes  Ears/Nose/Throat Sore throat?: No Sinus problems?: No  Hematologic/Lymphatic Swollen glands?: No Easy bruising?: No  Cardiovascular Leg swelling?: No Chest pain?: No  Respiratory Cough?: No Shortness of breath?: Yes  Endocrine Excessive thirst?: No  Musculoskeletal Back pain?: Yes Joint pain?: Yes  Neurological Headaches?: No Dizziness?: Yes  Psychologic Depression?: No Anxiety?: No  Physical Exam: BP 103/66   Pulse (!)  51   Ht 5\' 9"  (1.753 m)   Wt 148 lb 1.6 oz (67.2 kg)   BMI 21.87 kg/m   Constitutional: Well nourished. Alert and oriented, No acute distress. HEENT: [place] AT, moist mucus membranes. Trachea midline, no masses. Cardiovascular: No clubbing, cyanosis, or edema. Respiratory: Normal respiratory effort, no increased work of breathing. Skin: No rashes, bruises or suspicious lesions. Lymph: No cervical or inguinal adenopathy. Neurologic: Grossly intact, no focal deficits, moving all 4 extremities.  Psychiatric: Normal mood and affect.  Laboratory Data: Lab Results  Component Value Date   WBC 7.9 05/03/2016   HGB 11.9 (L) 05/03/2016   HCT 35.0 (L) 05/03/2016   MCV 92.4 05/03/2016   PLT 168 05/03/2016    Lab Results  Component Value Date   CREATININE 1.21 05/03/2016    Lab Results  Component Value Date   TSH 4.970 (H) 05/14/2015    Lab Results  Component Value Date   AST 21 04/25/2016   Lab Results  Component Value Date   ALT 23 04/25/2016    Pertinent Imaging: Results for YAHYE, SIEBERT (MRN [id number]) as of 06/01/2016 11:56  Ref. Range 06/01/2016 11:19  Scan Result Unknown 35     Assessment & Plan:   1. Urinary retention  -resolved   - return if unable to urinate or experiencing suprapubic discomfort  - follow-up in one year for IPSS and PVR  - BLADDER SCAN AMB NON-IMAGING  2. Constipation  - likely the cause of urinary retention  - continue good bowel management   Return in about 1 year (around 06/01/2017) for IPSS, exam and PVR.  These notes generated with voice recognition software. I apologize for typographical errors.  Michiel Cowboy, PA-C  [facility] [address] [place], [place] [address] [phone]

## 2016-06-11 ENCOUNTER — Emergency Department: Payer: Medicare Other

## 2016-06-11 ENCOUNTER — Observation Stay
Admission: EM | Admit: 2016-06-11 | Discharge: 2016-06-14 | Disposition: A | Discharge status: Nursing Home (Includes ICF) | Payer: Medicare Other | Attending: Internal Medicine | Admitting: Internal Medicine

## 2016-06-11 ENCOUNTER — Encounter: Payer: Self-pay | Admitting: Emergency Medicine

## 2016-06-11 DIAGNOSIS — R296 Repeated falls: Secondary | ICD-10-CM | POA: Diagnosis not present

## 2016-06-11 DIAGNOSIS — Z79899 Other long term (current) drug therapy: Secondary | ICD-10-CM | POA: Insufficient documentation

## 2016-06-11 DIAGNOSIS — E039 Hypothyroidism, unspecified: Secondary | ICD-10-CM | POA: Diagnosis not present

## 2016-06-11 DIAGNOSIS — Z951 Presence of aortocoronary bypass graft: Secondary | ICD-10-CM | POA: Diagnosis not present

## 2016-06-11 DIAGNOSIS — I951 Orthostatic hypotension: Principal | ICD-10-CM | POA: Insufficient documentation

## 2016-06-11 DIAGNOSIS — R55 Syncope and collapse: Secondary | ICD-10-CM | POA: Diagnosis present

## 2016-06-11 DIAGNOSIS — G20 Parkinson's disease: Secondary | ICD-10-CM | POA: Insufficient documentation

## 2016-06-11 DIAGNOSIS — R001 Bradycardia, unspecified: Secondary | ICD-10-CM

## 2016-06-11 DIAGNOSIS — F419 Anxiety disorder, unspecified: Secondary | ICD-10-CM | POA: Diagnosis not present

## 2016-06-11 DIAGNOSIS — M6281 Muscle weakness (generalized): Secondary | ICD-10-CM

## 2016-06-11 DIAGNOSIS — Z66 Do not resuscitate: Secondary | ICD-10-CM | POA: Insufficient documentation

## 2016-06-11 DIAGNOSIS — I2510 Atherosclerotic heart disease of native coronary artery without angina pectoris: Secondary | ICD-10-CM | POA: Insufficient documentation

## 2016-06-11 DIAGNOSIS — R262 Difficulty in walking, not elsewhere classified: Secondary | ICD-10-CM

## 2016-06-11 DIAGNOSIS — R2681 Unsteadiness on feet: Secondary | ICD-10-CM | POA: Diagnosis not present

## 2016-06-11 DIAGNOSIS — G2581 Restless legs syndrome: Secondary | ICD-10-CM | POA: Insufficient documentation

## 2016-06-11 DIAGNOSIS — I959 Hypotension, unspecified: Secondary | ICD-10-CM

## 2016-06-11 DIAGNOSIS — I10 Essential (primary) hypertension: Secondary | ICD-10-CM | POA: Diagnosis not present

## 2016-06-11 LAB — COMPREHENSIVE METABOLIC PANEL
ALBUMIN: 3.6 g/dL (ref 3.5–5.0)
ALK PHOS: 41 U/L (ref 38–126)
ALT: <5 U/L — ABNORMAL LOW (ref 17–63)
AST: 16 U/L (ref 15–41)
Anion gap: 6 (ref 5–15)
BILIRUBIN TOTAL: 0.6 mg/dL (ref 0.3–1.2)
BUN: 31 mg/dL — AB (ref 6–20)
CALCIUM: 8.7 mg/dL — AB (ref 8.9–10.3)
CO2: 28 mmol/L (ref 22–32)
CREATININE: 1.1 mg/dL (ref 0.61–1.24)
Chloride: 103 mmol/L (ref 101–111)
GFR calc Af Amer: >60 mL/min (ref >60)
GFR, EST NON AFRICAN AMERICAN: 57 mL/min — AB (ref >60)
GLUCOSE: 156 mg/dL — AB (ref 65–99)
Potassium: 4 mmol/L (ref 3.5–5.1)
Sodium: 137 mmol/L (ref 135–145)
TOTAL PROTEIN: 6 g/dL — AB (ref 6.5–8.1)

## 2016-06-11 LAB — CBC WITH DIFFERENTIAL/PLATELET
BASOS ABS: 0 10*3/uL (ref 0–0.1)
BASOS PCT: 0 %
Eosinophils Absolute: 0.1 10*3/uL (ref 0–0.7)
Eosinophils Relative: 1 %
HEMATOCRIT: 34.7 % — AB (ref 40.0–52.0)
HEMOGLOBIN: 12.1 g/dL — AB (ref 13.0–18.0)
LYMPHS PCT: 32 %
Lymphs Abs: 1.8 10*3/uL (ref 1.0–3.6)
MCH: 32.1 pg (ref 26.0–34.0)
MCHC: 34.8 g/dL (ref 32.0–36.0)
MCV: 92.3 fL (ref 80.0–100.0)
MONOS PCT: 6 %
Monocytes Absolute: 0.3 10*3/uL (ref 0.2–1.0)
NEUTROS ABS: 3.4 10*3/uL (ref 1.4–6.5)
NEUTROS PCT: 61 %
Platelets: 119 10*3/uL — ABNORMAL LOW (ref 150–440)
RBC: 3.77 MIL/uL — ABNORMAL LOW (ref 4.40–5.90)
RDW: 13.6 % (ref 11.5–14.5)
WBC: 5.5 10*3/uL (ref 3.8–10.6)

## 2016-06-11 LAB — TSH: TSH: 3.693 u[IU]/mL (ref 0.350–4.500)

## 2016-06-11 LAB — TROPONIN I

## 2016-06-11 MED ORDER — SODIUM CHLORIDE 0.9 % IV BOLUS (SEPSIS)
1000.0000 mL | Freq: Once | INTRAVENOUS | Status: AC
  Administered 2016-06-11: 1000 mL via INTRAVENOUS

## 2016-06-11 NOTE — ED Triage Notes (Signed)
Per ems the pt had a near syncopal episode at the nh. Pt a&ox2 per his normal mentation. No c/o pain.

## 2016-06-11 NOTE — ED Provider Notes (Signed)
[facility] Emergency Department Provider Note  ____________________________________________   First MD Initiated Contact with Patient 06/11/16 2302     (approximate)  I have reviewed the triage vital signs and the nursing notes.   HISTORY  Chief Complaint Near Syncope   HPI Anthony Blankenship is a 82 y.o. male with a history of coronary artery disease as well as Parkinson's disease was present emergency department today with a syncopal episode. Per EMS, the patient was sitting in his wheelchair when he was found to be nodding off. He denies any pain at this time. Denies any nausea vomiting or diarrhea. Also found to be bradycardic in the 50s by EMS with blood pressures, initially at 84 systolic.   Past Medical History:  Diagnosis Date  . Anxiety   . Coronary artery disease   . Depression   . Hypertension   . Kidney stones   . Parkinson's disease (HCC)   . Restless leg syndrome     Patient Active Problem List   Diagnosis Date Noted  . Lower GI bleed 04/25/2016  . GI bleed 04/25/2016  . Restlessness 05/14/2015    Past Surgical History:  Procedure Laterality Date  . APPENDECTOMY    . CORONARY ARTERY BYPASS GRAFT      Prior to Admission medications   Medication Sig Start Date End Date Taking? Authorizing Provider  acetaminophen (TYLENOL) 325 MG tablet Take 650 mg by mouth every 6 (six) hours as needed for mild pain.    Yes Historical Provider, MD  carbidopa-levodopa-entacapone (STALEVO) 50-200-200 MG tablet Take 1 tablet by mouth 4 (four) times daily.   Yes Historical Provider, MD  diazepam (VALIUM) 2 MG tablet Take 2 mg by mouth every 12 (twelve) hours as needed for anxiety.   Yes Historical Provider, MD  levothyroxine (SYNTHROID, LEVOTHROID) 50 MCG tablet Take 50 mcg by mouth daily.    Yes Historical Provider, MD  loperamide (IMODIUM A-D) 2 MG tablet Take 2 mg by mouth as needed for diarrhea or loose stools. Do not exceed 8 doses in 24 hours    Yes Historical Provider, MD  rOPINIRole (REQUIP) 1 MG tablet Take 1 tablet (1 mg total) by mouth 3 (three) times daily. 05/15/15  Yes Enedina FinnerSona Patel, MD  senna-docusate (SENOKOT-S) 8.6-50 MG tablet Take 1 tablet by mouth at bedtime as needed for mild constipation. 04/25/16  Yes Enedina FinnerSona Patel, MD  traMADol (ULTRAM) 50 MG tablet Take 50 mg by mouth 2 (two) times daily.   Yes Historical Provider, MD    Allergies Other  Family History  Problem Relation Age of Onset  . Liver disease Father   . Kidney disease Neg Hx   . Prostate cancer Neg Hx   . Bladder Cancer Neg Hx     Social History Social History  Substance Use Topics  . Smoking status: Never Smoker  . Smokeless tobacco: Never Used  . Alcohol use No    Review of Systems Constitutional: No fever/chills Eyes: No visual changes. ENT: No sore throat. Cardiovascular: Denies chest pain. Respiratory: Denies shortness of breath. Gastrointestinal: No abdominal pain.  No nausea, no vomiting.  No diarrhea.  No constipation. Genitourinary: Negative for dysuria. Musculoskeletal: Negative for back pain. Skin: Negative for rash. Neurological: Negative for headaches, focal weakness or numbness.  10-point ROS otherwise negative.  ____________________________________________   PHYSICAL EXAM:  VITAL SIGNS: ED Triage Vitals  Enc Vitals Group     BP 06/11/16 2300 (!) 84/50     Pulse Rate 06/11/16  2300 (!) 51     Resp 06/11/16 2300 14     Temp 06/11/16 2300 98 F (36.7 C)     Temp Source 06/11/16 2300 Oral     SpO2 06/11/16 2300 96 %     Weight 06/11/16 2302 142 lb (64.4 kg)     Height 06/11/16 2302 5\' 9"  (1.753 m)     Head Circumference --      Peak Flow --      Pain Score --      Pain Loc --      Pain Edu? --      Excl. in GC? --     Constitutional: Alert and oriented. Well appearing and in no acute distress. Eyes: Conjunctivae are normal. PERRL. EOMI. Head: Atraumatic. Nose: No congestion/rhinnorhea. Mouth/Throat: Mucous  membranes are moist.   Neck: No stridor.   Cardiovascular: Bradycardic, regular rhythm. Grossly normal heart sounds.   Respiratory: Normal respiratory effort.  No retractions. Lungs CTAB. Gastrointestinal: Soft and nontender. No distention.  Musculoskeletal: No lower extremity tenderness nor edema.  No joint effusions. Neurologic:  Normal speech and language. No gross focal neurologic deficits are appreciated.  Skin:  Skin is warm, dry and intact. No rash noted. Psychiatric: Mood and affect are normal. Speech and behavior are normal.  ____________________________________________   LABS (all labs ordered are listed, but only abnormal results are displayed)  Labs Reviewed  CBC WITH DIFFERENTIAL/PLATELET - Abnormal; Notable for the following:       Result Value   RBC 3.77 (*)    Hemoglobin 12.1 (*)    HCT 34.7 (*)    Platelets 119 (*)    All other components within normal limits  COMPREHENSIVE METABOLIC PANEL - Abnormal; Notable for the following:    Glucose, Bld 156 (*)    BUN 31 (*)    Calcium 8.7 (*)    Total Protein 6.0 (*)    ALT <5 (*)    GFR calc non Af Amer 57 (*)    All other components within normal limits  TROPONIN I  TSH  URINALYSIS, COMPLETE (UACMP) WITH MICROSCOPIC   ____________________________________________  EKG  ED ECG REPORT I, Arelia LongestSchaevitz,  Yahshua Thibault M, the attending physician, personally viewed and interpreted this ECG.   Date: 06/11/2016  EKG Time: 2259  Rate: 51  Rhythm: Sinus bradycardia  Axis: Normal axis  Intervals: Right bundle branch block  ST&T Change: No ST segment elevation or depression. No abnormal T-wave inversion.  ____________________________________________  RADIOLOGY   ____________________________________________   PROCEDURES  Procedure(s) performed:   Procedures  Critical Care performed:   ____________________________________________   INITIAL IMPRESSION / ASSESSMENT AND PLAN / ED COURSE  Pertinent labs & imaging  results that were available during my care of the patient were reviewed by me and considered in my medical decision making (see chart for details).  Patient appears chronically low heart rate in the 50s when previous records are reviewed. However, his blood pressure is usually normal to elevated.  Clinical Course    ----------------------------------------- 12:43 AM on 06/12/2016 -----------------------------------------  Patient resting of the liver and heart rate dipping down to the low 40s. Blood pressure is improved. Patient with initial blood pressure less than 90. History of coronary artery disease and still with bradycardia. We'll admit the hospital. Signed out to Dr. Anne HahnWillis. Patient aware of the plan and willing to comply.  ____________________________________________   FINAL CLINICAL IMPRESSION(S) / ED DIAGNOSES  Near-syncope. Bradycardia.    NEW MEDICATIONS STARTED DURING THIS  VISIT:  New Prescriptions   No medications on file     Note:  This document was prepared using Dragon voice recognition software and may include unintentional dictation errors.    Myrna Blazer, MD 06/12/16 [phone]

## 2016-06-12 ENCOUNTER — Observation Stay (HOSPITAL_BASED_OUTPATIENT_CLINIC_OR_DEPARTMENT_OTHER)
Admit: 2016-06-12 | Discharge: 2016-06-12 | Disposition: A | Discharge status: Home / Self Care | Payer: Medicare Other | Attending: Internal Medicine | Admitting: Internal Medicine

## 2016-06-12 DIAGNOSIS — R55 Syncope and collapse: Secondary | ICD-10-CM | POA: Diagnosis not present

## 2016-06-12 DIAGNOSIS — I951 Orthostatic hypotension: Secondary | ICD-10-CM | POA: Diagnosis not present

## 2016-06-12 LAB — URINALYSIS, COMPLETE (UACMP) WITH MICROSCOPIC
BACTERIA UA: NONE SEEN
BILIRUBIN URINE: NEGATIVE
Glucose, UA: NEGATIVE mg/dL
HGB URINE DIPSTICK: NEGATIVE
Ketones, ur: 5 mg/dL — AB
LEUKOCYTES UA: NEGATIVE
NITRITE: NEGATIVE
PH: 5 (ref 5.0–8.0)
Protein, ur: NEGATIVE mg/dL
RBC / HPF: NONE SEEN RBC/hpf (ref 0–5)
SPECIFIC GRAVITY, URINE: 1.02 (ref 1.005–1.030)
Squamous Epithelial / LPF: NONE SEEN

## 2016-06-12 LAB — MRSA PCR SCREENING: MRSA by PCR: NEGATIVE

## 2016-06-12 MED ORDER — CARBIDOPA-LEVODOPA-ENTACAPONE 50-200-200 MG PO TABS: 1.0000 | ORAL_TABLET | Freq: Four times a day (QID) | ORAL | Status: DC

## 2016-06-12 MED ORDER — DIAZEPAM 2 MG PO TABS: 2.0000 mg | ORAL_TABLET | Freq: Two times a day (BID) | ORAL | Status: DC | PRN

## 2016-06-12 MED ORDER — SODIUM CHLORIDE 0.9 % IV SOLN
INTRAVENOUS | Status: DC
  Administered 2016-06-12: 04:00:00 via INTRAVENOUS

## 2016-06-12 MED ORDER — ONDANSETRON HCL 4 MG/2ML IJ SOLN: 4.0000 mg | Freq: Four times a day (QID) | INTRAMUSCULAR | Status: DC | PRN

## 2016-06-12 MED ORDER — ACETAMINOPHEN 325 MG PO TABS: 650.0000 mg | ORAL_TABLET | Freq: Four times a day (QID) | ORAL | Status: DC | PRN

## 2016-06-12 MED ORDER — ENOXAPARIN SODIUM 40 MG/0.4ML ~~LOC~~ SOLN
40.0000 mg | SUBCUTANEOUS | Status: DC
  Administered 2016-06-12 – 2016-06-13 (×2): 40 mg via SUBCUTANEOUS
  Filled 2016-06-12 (×2): qty 0.4

## 2016-06-12 MED ORDER — CARBIDOPA-LEVODOPA 25-100 MG PO TABS
2.0000 | ORAL_TABLET | Freq: Four times a day (QID) | ORAL | Status: DC
  Administered 2016-06-12 – 2016-06-14 (×10): 2 via ORAL
  Filled 2016-06-12 (×12): qty 2

## 2016-06-12 MED ORDER — ENTACAPONE 200 MG PO TABS
200.0000 mg | ORAL_TABLET | Freq: Four times a day (QID) | ORAL | Status: DC
  Administered 2016-06-12 – 2016-06-14 (×10): 200 mg via ORAL
  Filled 2016-06-12 (×13): qty 1

## 2016-06-12 MED ORDER — LOPERAMIDE HCL 2 MG PO CAPS: 2.0000 mg | ORAL_CAPSULE | ORAL | Status: DC | PRN

## 2016-06-12 MED ORDER — SODIUM CHLORIDE 0.9% FLUSH
3.0000 mL | Freq: Two times a day (BID) | INTRAVENOUS | Status: DC
  Administered 2016-06-12 – 2016-06-14 (×5): 3 mL via INTRAVENOUS

## 2016-06-12 MED ORDER — ROPINIROLE HCL 1 MG PO TABS
1.0000 mg | ORAL_TABLET | Freq: Three times a day (TID) | ORAL | Status: DC
  Administered 2016-06-12 – 2016-06-14 (×7): 1 mg via ORAL
  Filled 2016-06-12 (×7): qty 1

## 2016-06-12 MED ORDER — SENNOSIDES-DOCUSATE SODIUM 8.6-50 MG PO TABS: 1.0000 | ORAL_TABLET | Freq: Every evening | ORAL | Status: DC | PRN

## 2016-06-12 MED ORDER — TRAMADOL HCL 50 MG PO TABS
50.0000 mg | ORAL_TABLET | Freq: Two times a day (BID) | ORAL | Status: DC
  Administered 2016-06-12 – 2016-06-14 (×5): 50 mg via ORAL
  Filled 2016-06-12 (×6): qty 1

## 2016-06-12 MED ORDER — ACETAMINOPHEN 650 MG RE SUPP: 650.0000 mg | Freq: Four times a day (QID) | RECTAL | Status: DC | PRN

## 2016-06-12 MED ORDER — ONDANSETRON HCL 4 MG PO TABS: 4.0000 mg | ORAL_TABLET | Freq: Four times a day (QID) | ORAL | Status: DC | PRN

## 2016-06-12 MED ORDER — LEVOTHYROXINE SODIUM 50 MCG PO TABS
50.0000 ug | ORAL_TABLET | Freq: Every day | ORAL | Status: DC
  Administered 2016-06-12 – 2016-06-14 (×3): 50 ug via ORAL
  Filled 2016-06-12 (×4): qty 1

## 2016-06-12 NOTE — Progress Notes (Signed)
*  PRELIMINARY RESULTS* Echocardiogram 2D Echocardiogram has been performed.  Anthony Blankenship 06/12/2016, 2:24 PM

## 2016-06-12 NOTE — ED Notes (Signed)
ED Provider at bedside. 

## 2016-06-12 NOTE — Progress Notes (Signed)
[employer] [facility] [place] [facility]                                                                                                                                                                                  Patient Demographics   Anthony Blankenship, is a 82 y.o. male, DOB - [birth year], WJX:[id number]  Admit date - 06/11/2016   Admitting Physician Arnaldo NatalMichael S Diamond, MD  Outpatient Primary MD for the patient is Pcp Not In System   LOS - 0  Subjective: Patient admitted with near syncope, he is had a recurrent falls at the assisted living facility. His daughter is at the bedside and concerned that she may    Review of Systems:   CONSTITUTIONAL: No documented fever. No fatigue, weakness. No weight gain, no weight loss.  EYES: No blurry or double vision.  ENT: No tinnitus. No postnasal drip. No redness of the oropharynx.  RESPIRATORY: No cough, no wheeze, no hemoptysis. No dyspnea.  CARDIOVASCULAR: No chest pain. No orthopnea. No palpitations. No syncope.  GASTROINTESTINAL: No nausea, no vomiting or diarrhea. No abdominal pain. No melena or hematochezia.  GENITOURINARY: No dysuria or hematuria.  ENDOCRINE: No polyuria or nocturia. No heat or cold intolerance.  HEMATOLOGY: No anemia. No bruising. No bleeding.  INTEGUMENTARY: No rashes. No lesions.  MUSCULOSKELETAL: No arthritis. No swelling. No gout.  NEUROLOGIC: No numbness, tingling, or ataxia. No seizure-type activity. Recurrent falls PSYCHIATRIC: No anxiety. No insomnia. No ADD.    Vitals:   Vitals:   06/12/16 0130 06/12/16 0229 06/12/16 0251 06/12/16 0444  BP: 120/69 (!) 204/88 (!) 152/73 (!) 184/76  Pulse: (!) 52 66 63 73  Resp: 12   19  Temp:  97.5 F (36.4 C)  98 F (36.7 C)  TempSrc:  Oral  Oral  SpO2: 99% 98%  100%  Weight:  142 lb (64.4 kg)    Height:  5\' 9"  (1.753 m)      Wt Readings from Last 3 Encounters:  06/12/16 142 lb (64.4 kg)  06/01/16 148 lb 1.6 oz (67.2 kg)  05/10/16 148 lb 9.6 oz  (67.4 kg)     Intake/Output Summary (Last 24 hours) at 06/12/16 1414 Last data filed at 06/12/16 1305  Gross per 24 hour  Intake          1247.08 ml  Output                0 ml  Net          1247.08 ml    Physical Exam:   GENERAL: Pleasant-appearing in no apparent distress.  HEAD, EYES, EARS, NOSE AND THROAT: Atraumatic, normocephalic. Extraocular muscles are intact.  Pupils equal and reactive to light. Sclerae anicteric. No conjunctival injection. No oro-pharyngeal erythema.  NECK: Supple. There is no jugular venous distention. No bruits, no lymphadenopathy, no thyromegaly.  HEART: Regular rate and rhythm,. No murmurs, no rubs, no clicks.  LUNGS: Clear to auscultation bilaterally. No rales or rhonchi. No wheezes.  ABDOMEN: Soft, flat, nontender, nondistended. Has good bowel sounds. No hepatosplenomegaly appreciated.  EXTREMITIES: No evidence of any cyanosis, clubbing, or peripheral edema.  +2 pedal and radial pulses bilaterally.  NEUROLOGIC: The patient is alert, awake, and oriented x2 with no focal motor or sensory deficits appreciated bilaterally.  SKIN: Moist and warm with no rashes appreciated.  Psych: Not anxious, depressed LN: No inguinal LN enlargement    Antibiotics   Anti-infectives    None      Medications   Scheduled Meds: . carbidopa-levodopa  2 tablet Oral QID   And  . entacapone  200 mg Oral QID  . enoxaparin (LOVENOX) injection  40 mg Subcutaneous Q24H  . levothyroxine  50 mcg Oral QAC breakfast  . rOPINIRole  1 mg Oral TID  . sodium chloride flush  3 mL Intravenous Q12H  . traMADol  50 mg Oral BID   Continuous Infusions: PRN Meds:.acetaminophen **OR** acetaminophen, diazepam, loperamide, ondansetron **OR** ondansetron (ZOFRAN) IV, senna-docusate   Data Review:   Micro Results Recent Results (from the past 240 hour(s))  MRSA PCR Screening     Status: None   Collection Time: 06/12/16  2:53 AM  Result Value Ref Range Status   MRSA by PCR NEGATIVE  NEGATIVE Final    Comment:        The GeneXpert MRSA Assay (FDA approved for NASAL specimens only), is one component of a comprehensive MRSA colonization surveillance program. It is not intended to diagnose MRSA infection nor to guide or monitor treatment for MRSA infections.     Radiology Reports Dg Chest 1 View  Result Date: 06/12/2016 CLINICAL DATA:  Near-syncope episode at nursing home today. History of Parkinson's disease, hypertension. EXAM: CHEST 1 VIEW COMPARISON:  Chest radiograph April 21, 2016 FINDINGS: Cardiomediastinal silhouette is normal. Calcified aortic knob. Status post median sternotomy for CABG. No pleural effusion or focal consolidation. No pneumothorax. Soft tissue planes and included osseous structures are nonsuspicious. IMPRESSION: No acute cardiopulmonary process, stable examination. Electronically Signed   By: Awilda Metroourtnay  Bloomer M.D.   On: 06/12/2016 00:33     CBC  Recent Labs Lab 06/11/16 2307  WBC 5.5  HGB 12.1*  HCT 34.7*  PLT 119*  MCV 92.3  MCH 32.1  MCHC 34.8  RDW 13.6  LYMPHSABS 1.8  MONOABS 0.3  EOSABS 0.1  BASOSABS 0.0    Chemistries   Recent Labs Lab 06/11/16 2307  NA 137  K 4.0  CL 103  CO2 28  GLUCOSE 156*  BUN 31*  CREATININE 1.10  CALCIUM 8.7*  AST 16  ALT <5*  ALKPHOS 41  BILITOT 0.6   ------------------------------------------------------------------------------------------------------------------ estimated creatinine clearance is 41.5 mL/min (by C-G formula based on SCr of 1.1 mg/dL). ------------------------------------------------------------------------------------------------------------------ No results for input(s): HGBA1C in the last 72 hours. ------------------------------------------------------------------------------------------------------------------ No results for input(s): CHOL, HDL, LDLCALC, TRIG, CHOLHDL, LDLDIRECT in the last 72  hours. ------------------------------------------------------------------------------------------------------------------  Recent Labs  06/11/16 2307  TSH 3.693   ------------------------------------------------------------------------------------------------------------------ No results for input(s): VITAMINB12, FOLATE, FERRITIN, TIBC, IRON, RETICCTPCT in the last 72 hours.  Coagulation profile No results for input(s): INR, PROTIME in the last 168 hours.  No results for input(s): DDIMER in the last  72 hours.  Cardiac Enzymes  Recent Labs Lab 06/11/16 2307  TROPONINI <0.03   ------------------------------------------------------------------------------------------------------------------ Invalid input(s): POCBNP    Assessment & Plan   This is an 82 year old male admitted for near-syncope.  1. Near-syncope: due to hypotention, obtain echo, tele, orthostatic bp check 2. Recurrent falls I will ask physical therapy to see daughter and patient are very interested in him going to a skilled nursing facility 3 Bradycardia: Chronic intermittent 4.Parkinson's: Continue dopamine agonists. 5. Hypothyroidism: TSH is normal Continue Synthroid 5. DVT prophylaxis: Lovenox 6. GI prophylaxis: None The patient is a DO NOT RESUSCITATE. Time spent on admission orders and patient care possibly 45 minutes     Code Status Orders        Start     Ordered   06/12/16 0256  Do not attempt resuscitation (DNR)  Continuous    Question Answer Comment  In the event of cardiac or respiratory ARREST Do not call a "code blue"   In the event of cardiac or respiratory ARREST Do not perform Intubation, CPR, defibrillation or ACLS   In the event of cardiac or respiratory ARREST Use medication by any route, position, wound care, and other measures to relive pain and suffering. May use oxygen, suction and manual treatment of airway obstruction as needed for comfort.      06/12/16 0255    Code Status  History    Date Active Date Inactive Code Status Order ID Comments User Context   04/25/2016  1:08 AM 04/25/2016  6:35 PM DNR [id number]  Tonye Royalty, DO Inpatient   04/25/2016  1:07 AM 04/25/2016  1:08 AM Full Code [id number]  Tonye Royalty, DO Inpatient   05/14/2015  7:05 PM 05/15/2015  4:51 PM DNR [id number]  Altamese Dilling, MD ED           Consults none   DVT Prophylaxis  Lovenox -  Lab Results  Component Value Date   PLT 119 (L) 06/11/2016      Time Spent in minutes   Greater than 50% of time spent in care coordination and counseling patient regarding the condition and plan of care.   Auburn Bilberry M.D on 06/12/2016 at 2:14 PM  Between 7am to 6pm - Pager - [phone]  After 6pm go to [email or web address] - password [email or web address] [facility]  [facility]   Office  [phone]

## 2016-06-12 NOTE — Care Management Obs Status (Signed)
MEDICARE OBSERVATION STATUS NOTIFICATION   Patient Details  Name: Anthony Blankenship MRN: [id number] Date of Birth: 12/17/1924   Medicare Observation Status Notification Given:  Yes    Caren MacadamMichelle Sereen Schaff, RN 06/12/2016, 7:30 PM

## 2016-06-12 NOTE — Clinical Social Work Note (Signed)
CSW received consult for possible SNF placement. CSW following pending PT/OT recs.   Argentina PonderKaren Martha Tremell Reimers, MSW, LCSW-A [phone]

## 2016-06-12 NOTE — Care Management Note (Signed)
Case Management Note  Patient Details  Name: Anthony Blankenship MRN: [id number] Date of Birth: 1925/04/05  Subjective/Objective:      82yo admitted from [facility] following a possible syncopal episode. Hx; Bradycardia and Parkinson's. Telemetry bed. DNR. Anticipate discharge back to [facility] after this hospitalization. [facility]-Social Work will follow for placement after discharge.              Action/Plan:   Expected Discharge Date:                  Expected Discharge Plan:     In-House Referral:     Discharge planning Services     Post Acute Care Choice:    Choice offered to:     DME Arranged:    DME Agency:     HH Arranged:    HH Agency:     Status of Service:     If discussed at [employer] Length of Stay Meetings, dates discussed:    Additional Comments:  Aariah Godette A, RN 06/12/2016, 2:43 PM

## 2016-06-12 NOTE — H&P (Signed)
Anthony Blankenship is an 82 y.o. male.   Chief Complaint: Fainting HPI: The patient with past medical history of coronary artery disease status post CABG presents emergency department from his assisted living facility due to an episode of near syncope. The patient states that caregivers observed him slumped forward in his chair. He remembers feeling weak but does not specifically remember blacking out. He denies chest pain or shortness of breath. In the emergency department and is found to be bradycardic which is apparently quite common for him. Initially he was relatively hypotensive with systolics 82-50. Due to his initial presentation emergency department staff called the hospitalist service to observe for improvement.  Past Medical History:  Diagnosis Date  . Anxiety   . Coronary artery disease   . Depression   . Hypertension   . Kidney stones   . Parkinson's disease ([place])   . Restless leg syndrome     Past Surgical History:  Procedure Laterality Date  . APPENDECTOMY    . CORONARY ARTERY BYPASS GRAFT      Family History  Problem Relation Age of Onset  . Liver disease Father   . Kidney disease Neg Hx   . Prostate cancer Neg Hx   . Bladder Cancer Neg Hx    Social History:  reports that he has never smoked. He has never used smokeless tobacco. He reports that he does not drink alcohol or use drugs.  Allergies:  Allergies  Allergen Reactions  . Other Other (See Comments)    Uncoded Allergy. Allergen: Sleeping Medications    Medications Prior to Admission  Medication Sig Dispense Refill  . acetaminophen (TYLENOL) 325 MG tablet Take 650 mg by mouth every 6 (six) hours as needed for mild pain.     . carbidopa-levodopa-entacapone (STALEVO) 50-200-200 MG tablet Take 1 tablet by mouth 4 (four) times daily.    . diazepam (VALIUM) 2 MG tablet Take 2 mg by mouth every 12 (twelve) hours as needed for anxiety.    Marland Kitchen levothyroxine (SYNTHROID, LEVOTHROID) 50 MCG tablet Take 50 mcg by mouth  daily.     Marland Kitchen loperamide (IMODIUM A-D) 2 MG tablet Take 2 mg by mouth as needed for diarrhea or loose stools. Do not exceed 8 doses in 24 hours    . rOPINIRole (REQUIP) 1 MG tablet Take 1 tablet (1 mg total) by mouth 3 (three) times daily. 90 tablet 0  . senna-docusate (SENOKOT-S) 8.6-50 MG tablet Take 1 tablet by mouth at bedtime as needed for mild constipation. 60 tablet 1  . traMADol (ULTRAM) 50 MG tablet Take 50 mg by mouth 2 (two) times daily.      Results for orders placed or performed during the hospital encounter of 06/11/16 (from the past 48 hour(s))  CBC with Differential     Status: Abnormal   Collection Time: 06/11/16 11:07 PM  Result Value Ref Range   WBC 5.5 3.8 - 10.6 K/uL   RBC 3.77 (L) 4.40 - 5.90 MIL/uL   Hemoglobin 12.1 (L) 13.0 - 18.0 g/dL   HCT 34.7 (L) 40.0 - 52.0 %   MCV 92.3 80.0 - 100.0 fL   MCH 32.1 26.0 - 34.0 pg   MCHC 34.8 32.0 - 36.0 g/dL   RDW 13.6 11.5 - 14.5 %   Platelets 119 (L) 150 - 440 K/uL   Neutrophils Relative % 61 %   Neutro Abs 3.4 1.4 - 6.5 K/uL   Lymphocytes Relative 32 %   Lymphs Abs 1.8 1.0 - 3.6  K/uL   Monocytes Relative 6 %   Monocytes Absolute 0.3 0.2 - 1.0 K/uL   Eosinophils Relative 1 %   Eosinophils Absolute 0.1 0 - 0.7 K/uL   Basophils Relative 0 %   Basophils Absolute 0.0 0 - 0.1 K/uL  Comprehensive metabolic panel     Status: Abnormal   Collection Time: 06/11/16 11:07 PM  Result Value Ref Range   Sodium 137 135 - 145 mmol/L   Potassium 4.0 3.5 - 5.1 mmol/L   Chloride 103 101 - 111 mmol/L   CO2 28 22 - 32 mmol/L   Glucose, Bld 156 (H) 65 - 99 mg/dL   BUN 31 (H) 6 - 20 mg/dL   Creatinine, Ser 1.10 0.61 - 1.24 mg/dL   Calcium 8.7 (L) 8.9 - 10.3 mg/dL   Total Protein 6.0 (L) 6.5 - 8.1 g/dL   Albumin 3.6 3.5 - 5.0 g/dL   AST 16 15 - 41 U/L   ALT <5 (L) 17 - 63 U/L   Alkaline Phosphatase 41 38 - 126 U/L   Total Bilirubin 0.6 0.3 - 1.2 mg/dL   GFR calc non Af Amer 57 (L) >60 mL/min   GFR calc Af Amer >60 >60 mL/min     Comment: (NOTE) The eGFR has been calculated using the CKD EPI equation. This calculation has not been validated in all clinical situations. eGFR's persistently <60 mL/min signify possible Chronic Kidney Disease.    Anion gap 6 5 - 15  Troponin I     Status: None   Collection Time: 06/11/16 11:07 PM  Result Value Ref Range   Troponin I <0.03 <0.03 ng/mL  TSH     Status: None   Collection Time: 06/11/16 11:07 PM  Result Value Ref Range   TSH 3.693 0.350 - 4.500 uIU/mL    Comment: Performed by a 3rd Generation assay with a functional sensitivity of <=0.01 uIU/mL.  Urinalysis, Complete w Microscopic     Status: Abnormal   Collection Time: 06/12/16  1:00 AM  Result Value Ref Range   Color, Urine AMBER (A) YELLOW    Comment: BIOCHEMICALS MAY BE AFFECTED BY COLOR   APPearance CLEAR (A) CLEAR   Specific Gravity, Urine 1.020 1.005 - 1.030   pH 5.0 5.0 - 8.0   Glucose, UA NEGATIVE NEGATIVE mg/dL   Hgb urine dipstick NEGATIVE NEGATIVE   Bilirubin Urine NEGATIVE NEGATIVE   Ketones, ur 5 (A) NEGATIVE mg/dL   Protein, ur NEGATIVE NEGATIVE mg/dL   Nitrite NEGATIVE NEGATIVE   Leukocytes, UA NEGATIVE NEGATIVE   RBC / HPF NONE SEEN 0 - 5 RBC/hpf   WBC, UA 0-5 0 - 5 WBC/hpf   Bacteria, UA NONE SEEN NONE SEEN   Squamous Epithelial / LPF NONE SEEN NONE SEEN   Mucous PRESENT   MRSA PCR Screening     Status: None   Collection Time: 06/12/16  2:53 AM  Result Value Ref Range   MRSA by PCR NEGATIVE NEGATIVE    Comment:        The GeneXpert MRSA Assay (FDA approved for NASAL specimens only), is one component of a comprehensive MRSA colonization surveillance program. It is not intended to diagnose MRSA infection nor to guide or monitor treatment for MRSA infections.    Dg Chest 1 View  Result Date: 06/12/2016 CLINICAL DATA:  Near-syncope episode at nursing home today. History of Parkinson's disease, hypertension. EXAM: CHEST 1 VIEW COMPARISON:  Chest radiograph April 21, 2016 FINDINGS:  Cardiomediastinal silhouette is normal. Calcified  aortic knob. Status post median sternotomy for CABG. No pleural effusion or focal consolidation. No pneumothorax. Soft tissue planes and included osseous structures are nonsuspicious. IMPRESSION: No acute cardiopulmonary process, stable examination. Electronically Signed   By: Elon Alas M.D.   On: 06/12/2016 00:33    Review of Systems  Constitutional: Negative for chills and fever.  HENT: Negative for sore throat and tinnitus.   Eyes: Negative for blurred vision and redness.  Respiratory: Negative for cough and shortness of breath.   Cardiovascular: Negative for chest pain, palpitations, orthopnea and PND.  Gastrointestinal: Negative for abdominal pain, diarrhea, nausea and vomiting.  Genitourinary: Negative for dysuria, frequency and urgency.  Musculoskeletal: Negative for joint pain and myalgias.  Skin: Negative for rash.       No lesions  Neurological: Positive for weakness. Negative for speech change and focal weakness.  Endo/Heme/Allergies: Does not bruise/bleed easily.       No temperature intolerance  Psychiatric/Behavioral: Negative for depression and suicidal ideas.    Blood pressure (!) 184/76, pulse 73, temperature 98 F (36.7 C), temperature source Oral, resp. rate 19, height 5' 9"  (1.753 m), weight 64.4 kg (142 lb), SpO2 100 %. Physical Exam  Constitutional: He is oriented to person, place, and time. He appears well-developed and well-nourished. No distress.  HENT:  Head: Normocephalic and atraumatic.  Mouth/Throat: Oropharynx is clear and moist.  Eyes: Conjunctivae and EOM are normal. Pupils are equal, round, and reactive to light. No scleral icterus.  Neck: Normal range of motion. Neck supple. No JVD present. No tracheal deviation present. No thyromegaly present.  Cardiovascular: Normal rate, regular rhythm and normal heart sounds.  Exam reveals no gallop and no friction rub.   No murmur heard. Respiratory: Effort  normal and breath sounds normal. No respiratory distress.  GI: Soft. Bowel sounds are normal. He exhibits no distension. There is no tenderness.  Genitourinary:  Genitourinary Comments: Deferred  Musculoskeletal: Normal range of motion. He exhibits no edema.  Lymphadenopathy:    He has no cervical adenopathy.  Neurological: He is alert and oriented to person, place, and time. No cranial nerve deficit.  Skin: Skin is warm and dry. No rash noted. No erythema.  Psychiatric: He has a normal mood and affect. His behavior is normal. Judgment and thought content normal.     Assessment/Plan This is an 82 year old male admitted for near-syncope.  1. Near-syncope: It is unclear if the episode described as syncope or dozing off. Nonetheless, the patient may have had an episode of symptomatic bradycardia. He denies chest pain. Laboratory evaluation is reassuring. He is a DO NOT RESUSCITATE thus I have deferred echocardiogram at this time. Heart rate and blood pressure have also improved. Continue to observe and monitor telemetry. 2. Bradycardia: Chronic intermittent 3. Parkinson's: Continue dopamine agonists. 4. Hypothyroidism: Check TSH. Continue Synthroid 5. DVT prophylaxis: Lovenox 6. GI prophylaxis: None The patient is a DO NOT RESUSCITATE. Time spent on admission orders and patient care possibly 45 minutes  Harrie Foreman, MD 06/12/2016, 7:16 AM

## 2016-06-13 DIAGNOSIS — I951 Orthostatic hypotension: Secondary | ICD-10-CM | POA: Diagnosis not present

## 2016-06-13 LAB — ECHOCARDIOGRAM COMPLETE
HEIGHTINCHES: 69 in
Weight: 2272 oz

## 2016-06-13 MED ORDER — ENSURE ENLIVE PO LIQD
237.0000 mL | Freq: Two times a day (BID) | ORAL | Status: DC
  Administered 2016-06-13 – 2016-06-14 (×4): 237 mL via ORAL

## 2016-06-13 NOTE — Evaluation (Signed)
Physical Therapy Evaluation Patient Details Name: Anthony Blankenship MRN: [id number] DOB: 01/22/1925 Today's Date: 06/13/2016   History of Present Illness  The pt is an 82 yo M with past medical history of coronary artery disease status post CABG presents emergency department from his assisted living facility due to an episode of near syncope. The patient states that caregivers observed him slumped forward in his chair. He remembers feeling weak but does not specifically remember blacking out. He denies chest pain or shortness of breath. In the emergency department and is found to be bradycardic which is apparently quite common for him. Initially he was relatively hypotensive with systolics 80-90. Due to his initial presentation emergency department staff called the hospitalist service to observe for improvement.  Clinical Impression  Pt presents with deficits in strength, transfers, mobility, gait, balance, and activity tolerance.  Pt required min A and extra time/effort with bed mobility tasks.  Pt was CGA with transfers with cues for proper sequencing and ambulated 1 x 50' and 1 x 100' with RW and CGA with no LOB and without c/o light headedness, dizziness, or SOB.  HR increased from baseline of 60 bpm to 68 bpm after amb with SpO2 holding at 98% on room air.  Pt will benefit from PT services to address above deficits for decreased caregiver assistance upon discharge.      Follow Up Recommendations Home health PT;Supervision/Assistance - 24 hour    Equipment Recommendations  Rolling walker with 5" wheels    Recommendations for Other Services       Precautions / Restrictions Precautions Precautions: Fall Restrictions Weight Bearing Restrictions: No      Mobility  Bed Mobility Overal bed mobility: Needs Assistance Bed Mobility: Supine to Sit;Sit to Supine     Supine to sit: Min assist Sit to supine: Min assist   General bed mobility comments: Extra time and effort required with  min verbal cues for sequencing  Transfers Overall transfer level: Needs assistance Equipment used: Rolling walker (2 wheeled) Transfers: Sit to/from Stand Sit to Stand: Min guard         General transfer comment: Mod verbal cues for hand placement  Ambulation/Gait Ambulation/Gait assistance: Min guard Ambulation Distance (Feet): 100 Feet Assistive device: Rolling walker (2 wheeled) Gait Pattern/deviations: Trunk flexed;Decreased step length - right;Decreased step length - left   Gait velocity interpretation: <1.8 ft/sec, indicative of risk for recurrent falls    Stairs            Wheelchair Mobility    Modified Rankin (Stroke Patients Only)       Balance Overall balance assessment: Needs assistance Sitting-balance support: No upper extremity supported Sitting balance-Leahy Scale: Good     Standing balance support: No upper extremity supported Standing balance-Leahy Scale: Fair Standing balance comment: Pt able to maintain balance with SBA with feet apart and together without UE support, functional reach > 10"                             Pertinent Vitals/Pain Pain Assessment: No/denies pain    Home Living Family/patient expects to be discharged to:: Assisted living               Home Equipment: Gilmer MorCane - single point;Walker - 4 wheels      Prior Function Level of Independence: Needs assistance   Gait / Transfers Assistance Needed: Pt reports ambulates independently in ALF with a rollator but averages around 1 fall  per week  ADL's / Homemaking Assistance Needed: Assistance as needed from ALF staff        Hand Dominance   Dominant Hand: Right    Extremity/Trunk Assessment        Lower Extremity Assessment Lower Extremity Assessment: Generalized weakness (LLE weakness more pronounced than RLE grossly)       Communication      Cognition Arousal/Alertness: Awake/alert Behavior During Therapy: WFL for tasks  assessed/performed Overall Cognitive Status: Within Functional Limits for tasks assessed                      General Comments      Exercises Total Joint Exercises Ankle Circles/Pumps: AROM;Both;10 reps Towel Squeeze: AROM;Both;10 reps Hip ABduction/ADduction: AROM;Both;10 reps Long Arc Quad: Strengthening;Both;10 reps Knee Flexion: Strengthening;Both;10 reps Other Exercises Other Exercises: Seated B hip flex x 10   Assessment/Plan    PT Assessment Patient needs continued PT services  PT Problem List Decreased strength;Decreased activity tolerance;Decreased balance          PT Treatment Interventions DME instruction;Gait training;Functional mobility training;Therapeutic activities;Therapeutic exercise;Balance training;Neuromuscular re-education;Patient/family education    PT Goals (Current goals can be found in the Care Plan section)  Acute Rehab PT Goals Patient Stated Goal: To be stronger and to exercise more PT Goal Formulation: With patient Time For Goal Achievement: 06/26/16 Potential to Achieve Goals: Good    Frequency Min 2X/week   Barriers to discharge        Co-evaluation               End of Session Equipment Utilized During Treatment: Gait belt Activity Tolerance: Patient tolerated treatment well Patient left: in chair;with chair alarm set;with call bell/phone within reach Nurse Communication: Mobility status    Functional Assessment Tool Used: Clinical reasoning Functional Limitation: Mobility: Walking and moving around Mobility: Walking and Moving Around Current Status [phone](G8978): At least 20 percent but less than 40 percent impaired, limited or restricted Mobility: Walking and Moving Around Goal Status [phone](G8979): At least 1 percent but less than 20 percent impaired, limited or restricted    Time: 0910 (Returned to room for additional amb and therex from 10:00 AM to 10:12 AM)-0943 PT Time Calculation (min) (ACUTE ONLY): 33  min   Charges:   PT Evaluation $PT Eval Low Complexity: 1 Procedure PT Treatments $Therapeutic Exercise: 8-22 mins   PT G Codes:   PT G-Codes **NOT FOR INPATIENT CLASS** Functional Assessment Tool Used: Clinical reasoning Functional Limitation: Mobility: Walking and moving around Mobility: Walking and Moving Around Current Status ([id number](G8978): At least 20 percent but less than 40 percent impaired, limited or restricted Mobility: Walking and Moving Around Goal Status [phone](G8979): At least 1 percent but less than 20 percent impaired, limited or restricted    D. Scott Ashari Llewellyn PT, DPT 06/13/16, 10:28 AM

## 2016-06-13 NOTE — [place] FL2 (Signed)
[place] MEDICAID FL2 LEVEL OF CARE SCREENING TOOL     IDENTIFICATION  Patient Name: Anthony Blankenship Birthdate: 1924/06/29 Sex: male Admission Date (Current Location): 06/11/2016  [facility] [place]   Facility and Address:  [facility], [address], [place], [place] [address]      Provider Number: [id number]  Attending Physician Name and Address:  Auburn BilberryShreyang Patel, MD  Relative Name and Phone Number:       Current Level of Care: Hospital Recommended Level of Care: Assisted Living Facility Prior Approval Number:    Date Approved/Denied:   PASRR Number:  ( [id number] O )  Discharge Plan: Domiciliary (Rest home)    Current Diagnoses: Patient Active Problem List   Diagnosis Date Noted  . Near syncope 06/12/2016  . Lower GI bleed 04/25/2016  . GI bleed 04/25/2016  . Restlessness 05/14/2015    Orientation RESPIRATION BLADDER Height & Weight     Self, Situation, Place  Normal Incontinent Weight: 142 lb (64.4 kg) Height:  5\' 9"  (175.3 cm)  BEHAVIORAL SYMPTOMS/MOOD NEUROLOGICAL BOWEL NUTRITION STATUS   (none)  (Parkinson's disease ) Continent Diet (Regular Diet )  AMBULATORY STATUS COMMUNICATION OF NEEDS Skin   Limited Assist Verbally Normal                       Personal Care Assistance Level of Assistance  Bathing, Feeding, Dressing Bathing Assistance: Limited assistance Feeding assistance: Limited assistance Dressing Assistance: Limited assistance     Functional Limitations Info  Sight, Hearing, Speech Sight Info: Adequate Hearing Info: Adequate Speech Info: Adequate    SPECIAL CARE FACTORS FREQUENCY  PT (By licensed PT), OT (By licensed OT)     PT Frequency:  (2-3 days per home health. ) OT Frequency:  (2-3 days per week per home health. )            Contractures      Additional Factors Info  Code Status, Allergies Code Status Info:  (DNR ) Allergies Info:  (Sleeping Medications. )            Current Medications (06/13/2016):  This is the current hospital active medication list Current Facility-Administered Medications  Medication Dose Route Frequency Provider Last Rate Last Dose  . acetaminophen (TYLENOL) tablet 650 mg  650 mg Oral Q6H PRN Arnaldo NatalMichael S Diamond, MD       Or  . acetaminophen (TYLENOL) suppository 650 mg  650 mg Rectal Q6H PRN Arnaldo NatalMichael S Diamond, MD      . carbidopa-levodopa (SINEMET IR) 25-100 MG per tablet immediate release 2 tablet  2 tablet Oral QID Cindi CarbonMary M Swayne, [facility]   2 tablet at 06/13/16 1518   And  . entacapone (COMTAN) tablet 200 mg  200 mg Oral QID Cindi CarbonMary M Swayne, RPH   200 mg at 06/13/16 1518  . diazepam (VALIUM) tablet 2 mg  2 mg Oral Q12H PRN Arnaldo NatalMichael S Diamond, MD      . enoxaparin (LOVENOX) injection 40 mg  40 mg Subcutaneous Q24H Arnaldo NatalMichael S Diamond, MD   40 mg at 06/12/16 2210  . feeding supplement (ENSURE ENLIVE) (ENSURE ENLIVE) liquid 237 mL  237 mL Oral BID BM Auburn BilberryShreyang Patel, MD   237 mL at 06/13/16 1518  . levothyroxine (SYNTHROID, LEVOTHROID) tablet 50 mcg  50 mcg Oral QAC breakfast Arnaldo NatalMichael S Diamond, MD   50 mcg at 06/13/16 0941  . loperamide (IMODIUM) capsule 2 mg  2 mg Oral PRN Arnaldo NatalMichael S Diamond,  MD      . ondansetron (ZOFRAN) tablet 4 mg  4 mg Oral Q6H PRN Arnaldo NatalMichael S Diamond, MD       Or  . ondansetron [facility](ZOFRAN) injection 4 mg  4 mg Intravenous Q6H PRN Arnaldo NatalMichael S Diamond, MD      . rOPINIRole (REQUIP) tablet 1 mg  1 mg Oral TID Arnaldo NatalMichael S Diamond, MD   1 mg at 06/13/16 1518  . senna-docusate (Senokot-S) tablet 1 tablet  1 tablet Oral QHS PRN Arnaldo NatalMichael S Diamond, MD      . sodium chloride flush (NS) 0.9 % injection 3 mL  3 mL Intravenous Q12H Auburn BilberryShreyang Patel, MD   3 mL at 06/13/16 0943  . traMADol (ULTRAM) tablet 50 mg  50 mg Oral BID Arnaldo NatalMichael S Diamond, MD   50 mg at 06/13/16 [id number]     Discharge Medications: Please see discharge summary for a list of discharge medications.  Relevant Imaging Results:  Relevant Lab Results:   Additional  Information  (SSN: [id number])  Wilkins Elpers, Darleen CrockerBailey M, LCSW

## 2016-06-13 NOTE — Clinical Social Work Note (Signed)
Clinical Social Work Assessment  Patient Details  Name: Vennie Homansdward C Romm MRN: [id number] Date of Birth: [birth year]  Date of referral:  06/13/16               Reason for consult:  Other (Comment Required) (From [facility] )                Permission sought to share information with:  Oceanographeracility Contact Representative Permission granted to share information::  Yes, Verbal Permission Granted  Name::      [facility]   Agency::     Relationship::     Contact Information:     Housing/Transportation Living arrangements for the past 2 months:  Assisted Living Facility Source of Information:  Adult Children, Facility Patient Interpreter Needed:  None Criminal Activity/Legal Involvement Pertinent to Current Situation/Hospitalization:  No - Comment as needed Significant Relationships:  Adult Children Lives with:  Facility Resident Do you feel safe going back to the place where you live?  Yes Need for family participation in patient care:  Yes (Comment)  Care giving concerns: Patient is a long term care resident at [facility] (fax: [phone]).    Social Worker assessment / plan:  Visual merchandiserClinical Social Worker (CSW) reviewed chart and noted that patient is from [facility]. Patient walked 100 feet with PT and recommendation is home health 24/7 supervision. CSW contacted FirefighterDustin administrator at [facility] who reported that patient has been a resident there for over 1 year now. Per Amalia Haileyustin patient walks with a walker or cane at baseline when he remembers. Per Amalia Haileyustin patient is on room air at baseline and can have home health with [facility]. Per Amalia Haileyustin patient can return to [facility] when stable. CSW contacted patient's daughter Eber JonesCarolyn. Per daughter patient requested to move into a SNF. CSW explained that PT is recommending home health and patient does not meet the criteria for that level of care yet. CSW also explained that patient is under observation which means that Medicare will not pay for SNF.  Daughter verbalized her understanding and is agreeable for patient to return to [facility]. Per daughter patient will need EMS for transport back to facility. Daughter reported that patient makes his own decisions and does not have a HPOA. Daughter reported that an MD ordered an electric wheel chair 8 months ago. CSW explained that [facility] is probably working on the Biochemist, clinicalelectric wheel chair. Per daughter patient has 2 adult children including herself and a son that lives in [place]. Per [place] and visits patient as often as she can. Daughter reported that she has health problems herself including COPD. FL2 complete. CSW will continue to follow and assist as needed.   Employment status:  Retired Health and safety inspectornsurance information:  Armed forces operational officerMedicare, [place] Recommendations:  Home with Home Health, 24 Hour Supervision Information / Referral to community resources:  Other (Comment Required) (Patient will return to [facility]. )  Patient/Family's Response to care:  Patient's daughter is agreeable for patient to return to [facility].   Patient/Family's Understanding of and Emotional Response to Diagnosis, Current Treatment, and Prognosis:  Patient's daughter was very pleasant and thanked CSW for calling.   Emotional Assessment Appearance:  Appears stated age Attitude/Demeanor/Rapport:    Affect (typically observed):  Pleasant Orientation:  Oriented to Self, Oriented to Place, Oriented to Situation, Fluctuating Orientation (Suspected and/or reported Sundowners) Alcohol / Substance use:  Not Applicable Psych involvement (Current and /or in the  community):  No (Comment)  Discharge Needs  Concerns to be addressed:  Discharge Planning Concerns Readmission within the last 30 days:  No Current discharge risk:  Chronically ill, Cognitively Impaired Barriers to Discharge:  Continued Medical Work up   [employer], Darleen CrockerBailey M, LCSW 06/13/2016, 5:21 PM

## 2016-06-13 NOTE — Progress Notes (Signed)
Patient is alert and oriented x 4. Denied any acute pain. No acute respiratory distress noted. Patient is resting quietly with his eyes closed, respirations even and unlabored. Weight this morning is 150 lb, way off admission wt. Admission wt might have been a stated wt. Wt done about 4 different times and came up with the same number.  Will continue to monitor.

## 2016-06-13 NOTE — Clinical Social Work Note (Signed)
CSW spoke with patient in regards to returning to [facility].  Patient states he does not like it very much, but he is willing to return back to ALF.  Patient requests that CSW contact patient's daughter to discuss discharge planning back to ALF.  Ervin KnackEric R. Oval Cavazos, MSW, Theresia MajorsLCSWA [phone]  Mon-Fri 8a-4:30p 06/13/2016 6:16 PM

## 2016-06-13 NOTE — Progress Notes (Signed)
[facility] -  at [facility]                                                                                                                                                                                  Patient Demographics   Anthony Blankenship, is a 81 y.o. male, DOB - 26-Apr-1925, ONG:[id number]  Admit date - 06/11/2016   Admitting Physician Arnaldo Natal, MD  Outpatient Primary MD for the patient is Pcp Not In System   LOS - 0  Subjective: Continues to complain of feeling weak and having gait instability   Review of Systems:   CONSTITUTIONAL: No documented fever. Positive fatigue, positive weakness. No weight gain, no weight loss.  EYES: No blurry or double vision.  ENT: No tinnitus. No postnasal drip. No redness of the oropharynx.  RESPIRATORY: No cough, no wheeze, no hemoptysis. No dyspnea.  CARDIOVASCULAR: No chest pain. No orthopnea. No palpitations. No syncope.  GASTROINTESTINAL: No nausea, no vomiting or diarrhea. No abdominal pain. No melena or hematochezia.  GENITOURINARY: No dysuria or hematuria.  ENDOCRINE: No polyuria or nocturia. No heat or cold intolerance.  HEMATOLOGY: No anemia. No bruising. No bleeding.  INTEGUMENTARY: No rashes. No lesions.  MUSCULOSKELETAL: No arthritis. No swelling. No gout.  NEUROLOGIC: No numbness, tingling, or ataxia. No seizure-type activity. Recurrent falls PSYCHIATRIC: No anxiety. No insomnia. No ADD.    Vitals:   Vitals:   06/12/16 2009 06/13/16 0518 06/13/16 1013 06/13/16 1359  BP: (!) 100/57 123/74  (!) 153/85  Pulse: (!) 54 (!) 46 60 60  Resp:  18  18  Temp:  98.3 F (36.8 C)  97.8 F (36.6 C)  TempSrc:  Oral  Oral  SpO2: 97% 97% 98% 100%  Weight:      Height:        Wt Readings from Last 3 Encounters:  06/12/16 142 lb (64.4 kg)  06/01/16 148 lb 1.6 oz (67.2 kg)  05/10/16 148 lb 9.6 oz (67.4 kg)     Intake/Output Summary (Last 24 hours) at 06/13/16 1425 Last data filed at 06/13/16 1353   Gross per 24 hour  Intake              360 ml  Output              125 ml  Net              235 ml    Physical Exam:   GENERAL: Pleasant-appearing in no apparent distress.  HEAD, EYES, EARS, NOSE AND THROAT: Atraumatic, normocephalic. Extraocular muscles are intact. Pupils equal and reactive to light. Sclerae anicteric. No conjunctival injection. No oro-pharyngeal erythema.  NECK: Supple.  There is no jugular venous distention. No bruits, no lymphadenopathy, no thyromegaly.  HEART: Regular rate and rhythm,. No murmurs, no rubs, no clicks.  LUNGS: Clear to auscultation bilaterally. No rales or rhonchi. No wheezes.  ABDOMEN: Soft, flat, nontender, nondistended. Has good bowel sounds. No hepatosplenomegaly appreciated.  EXTREMITIES: No evidence of any cyanosis, clubbing, or peripheral edema.  +2 pedal and radial pulses bilaterally.  NEUROLOGIC: The patient is alert, awake, and oriented x2 with no focal motor or sensory deficits appreciated bilaterally.  SKIN: Moist and warm with no rashes appreciated.  Psych: Not anxious, depressed LN: No inguinal LN enlargement    Antibiotics   Anti-infectives    None      Medications   Scheduled Meds: . carbidopa-levodopa  2 tablet Oral QID   And  . entacapone  200 mg Oral QID  . enoxaparin (LOVENOX) injection  40 mg Subcutaneous Q24H  . feeding supplement (ENSURE ENLIVE)  237 mL Oral BID BM  . levothyroxine  50 mcg Oral QAC breakfast  . rOPINIRole  1 mg Oral TID  . sodium chloride flush  3 mL Intravenous Q12H  . traMADol  50 mg Oral BID   Continuous Infusions: PRN Meds:.acetaminophen **OR** acetaminophen, diazepam, loperamide, ondansetron **OR** ondansetron (ZOFRAN) IV, senna-docusate   Data Review:   Micro Results Recent Results (from the past 240 hour(s))  MRSA PCR Screening     Status: None   Collection Time: 06/12/16  2:53 AM  Result Value Ref Range Status   MRSA by PCR NEGATIVE NEGATIVE Final    Comment:        The GeneXpert  MRSA Assay (FDA approved for NASAL specimens only), is one component of a comprehensive MRSA colonization surveillance program. It is not intended to diagnose MRSA infection nor to guide or monitor treatment for MRSA infections.     Radiology Reports Dg Chest 1 View  Result Date: 06/12/2016 CLINICAL DATA:  Near-syncope episode at nursing home today. History of Parkinson's disease, hypertension. EXAM: CHEST 1 VIEW COMPARISON:  Chest radiograph April 21, 2016 FINDINGS: Cardiomediastinal silhouette is normal. Calcified aortic knob. Status post median sternotomy for CABG. No pleural effusion or focal consolidation. No pneumothorax. Soft tissue planes and included osseous structures are nonsuspicious. IMPRESSION: No acute cardiopulmonary process, stable examination. Electronically Signed   By: Awilda Metroourtnay  Bloomer M.D.   On: 06/12/2016 00:33     CBC  Recent Labs Lab 06/11/16 2307  WBC 5.5  HGB 12.1*  HCT 34.7*  PLT 119*  MCV 92.3  MCH 32.1  MCHC 34.8  RDW 13.6  LYMPHSABS 1.8  MONOABS 0.3  EOSABS 0.1  BASOSABS 0.0    Chemistries   Recent Labs Lab 06/11/16 2307  NA 137  K 4.0  CL 103  CO2 28  GLUCOSE 156*  BUN 31*  CREATININE 1.10  CALCIUM 8.7*  AST 16  ALT <5*  ALKPHOS 41  BILITOT 0.6   ------------------------------------------------------------------------------------------------------------------ estimated creatinine clearance is 41.5 mL/min (by C-G formula based on SCr of 1.1 mg/dL). ------------------------------------------------------------------------------------------------------------------ No results for input(s): HGBA1C in the last 72 hours. ------------------------------------------------------------------------------------------------------------------ No results for input(s): CHOL, HDL, LDLCALC, TRIG, CHOLHDL, LDLDIRECT in the last 72  hours. ------------------------------------------------------------------------------------------------------------------  Recent Labs  06/11/16 2307  TSH 3.693   ------------------------------------------------------------------------------------------------------------------ No results for input(s): VITAMINB12, FOLATE, FERRITIN, TIBC, IRON, RETICCTPCT in the last 72 hours.  Coagulation profile No results for input(s): INR, PROTIME in the last 168 hours.  No results for input(s): DDIMER in the last 72 hours.  Cardiac Enzymes  Recent Labs Lab 06/11/16 2307  TROPONINI <0.03   ------------------------------------------------------------------------------------------------------------------ Invalid input(s): POCBNP    Assessment & Plan   This is an 82 year old male admitted for near-syncope.  1. Near-syncope: due to hypotention,  Blood pressure currently improved. Discontinue IV fluids monitor blood pressure  2. Recurrent falls seen by physical therapy who recommend 24-hour monitoring with home health however patient and daughter are not satisfied and would like to speak to social worker regarding discharge to a skilled nursing facility I have discussed with Minerva Areolaric who will talk to them. 3 Bradycardia: Chronic intermittent 4.Parkinson's: Continue dopamine agonists. 5. Hypothyroidism: TSH is normal Continue Synthroid 5. DVT prophylaxis: Lovenox 6. GI prophylaxis: None The patient is a DO NOT RESUSCITATE.    Time spent 32min     Code Status Orders        Start     Ordered   06/12/16 0256  Do not attempt resuscitation (DNR)  Continuous    Question Answer Comment  In the event of cardiac or respiratory ARREST Do not call a "code blue"   In the event of cardiac or respiratory ARREST Do not perform Intubation, CPR, defibrillation or ACLS   In the event of cardiac or respiratory ARREST Use medication by any route, position, wound care, and other measures to relive pain and  suffering. May use oxygen, suction and manual treatment of airway obstruction as needed for comfort.      06/12/16 0255    Code Status History    Date Active Date Inactive Code Status Order ID Comments User Context   04/25/2016  1:08 AM 04/25/2016  6:35 PM DNR [id number]  Tonye RoyaltyAlexis Hugelmeyer, DO Inpatient   04/25/2016  1:07 AM 04/25/2016  1:08 AM Full Code [id number]  Tonye RoyaltyAlexis Hugelmeyer, DO Inpatient   05/14/2015  7:05 PM 05/15/2015  4:51 PM DNR [id number]  Altamese DillingVaibhavkumar Vachhani, MD ED           Consults none   DVT Prophylaxis  Lovenox -  Lab Results  Component Value Date   PLT 119 (L) 06/11/2016      Time Spent in minutes  32min  Greater than 50% of time spent in care coordination and counseling patient regarding the condition and plan of care.   Auburn BilberryPATEL, Amillion Scobee M.D on 06/13/2016 at 2:25 PM  Between 7am to 6pm - Pager - [phone]  After 6pm go to [email or web address] - password [email or web address] [facility]  [facility]   Office  [phone]

## 2016-06-13 NOTE — Progress Notes (Signed)
Initial Nutrition Assessment  DOCUMENTATION CODES:   Severe malnutrition in context of chronic illness  INTERVENTION:  Provide chocolate milk with all trays.  Provide Ensure Enlive po BID, each supplement provides 350 kcal and 20 grams of protein.  NUTRITION DIAGNOSIS:   Malnutrition (Severe) related to chronic illness as evidenced by 12 percent weight loss over 5 months, severe depletion of body fat, severe depletion of muscle mass.  GOAL:   Patient will meet greater than or equal to 90% of their needs  MONITOR:   PO intake, Supplement acceptance, Labs, Weight trends, I & O's  REASON FOR ASSESSMENT:   Malnutrition Screening Tool    ASSESSMENT:   82 year old male with past medical history significant for coronary artery disease s/p CABG, Parkinson's, recurrent falls who presents to ED from his assisted living facility due to an episode of near syncope due to hypotension.    Spoke with patient at bedside. Unsure if patient was confused as some of his answers did not align with findings in chart. Patient reports good appetite and that he eats 100% of 3 meals daily. When asked about his typical intake, patient reported a cup of "something" and rice krispy treats, which he reported was a "well balanced meal." Also reports he enjoys chocolate milk. Patient denies N/V, abdominal pain, or difficulty chewing/swallowing. Reports some constipation.   When asked about UBW he reported is was 204 lbs one year ago. There is no weight in chart as high as 204 lbs. Approximately 1 year ago patient was 153 lbs. He then gained to 170 lbs by 10/27/2015. Since then he has lost approximately 20 lbs (12% body weight) over 5 months, which is significant for time frame, and has been stable at approximately 150 lbs since 04/2016. Noted that in chart RN recorded admission weight of 142 lbs is inaccurate and patient was weighing 150 lbs - could not verify as patient was in chair.  Meal Completion: 20-100% of  Regular Diet  Medications reviewed and include: levothyroxine.  Labs reviewed: BUN 31, Glucose 156.  Nutrition-Focused physical exam completed. Findings are moderate-severe fat depletion, moderate-severe muscle depletion, and no edema.  Patient meets criteria for severe chronic malnutrition in setting of 12% weight loss over 5 months, moderate-severe fat depletion, moderate-severe muscle depletion.  Diet Order:  Diet regular Room service appropriate? Yes; Fluid consistency: Thin  Skin:  Reviewed, no issues  Last BM:  06/11/2016  Height:   Ht Readings from Last 1 Encounters:  06/12/16 5\' 9"  (1.753 m)    Weight:   Wt Readings from Last 1 Encounters:  06/12/16 142 lb (64.4 kg)    Ideal Body Weight:  72.7 kg  BMI:  Body mass index is 20.97 kg/m.  Estimated Nutritional Needs:   Kcal:  1500-1700 (MSJ x 1.1-1.3)  Protein:  75-90 grams (1.1-1.3 grams/kg)  Fluid:  >/= 1.7 L/day (25 ml/kg)  EDUCATION NEEDS:   No education needs identified at this time  Helane RimaLeanne Sanja Elizardo, MS, RD, LDN Pager: [phone] After Hours Pager: [phone]

## 2016-06-14 DIAGNOSIS — I951 Orthostatic hypotension: Secondary | ICD-10-CM | POA: Diagnosis not present

## 2016-06-14 LAB — CREATININE, SERUM
Creatinine, Ser: 1.02 mg/dL (ref 0.61–1.24)
GFR calc non Af Amer: >60 mL/min (ref >60)

## 2016-06-14 NOTE — Progress Notes (Signed)
Patient is medically stable for D/C back to [facility] today. Per Art therapistAmber RN at [facility] patient can return today. RN will call report and arrange EMS for transport. Clinical Child psychotherapistocial Worker (CSW) sent D/C orders to [facility]. Patient is aware of above. CSW contacted patient's daughter Eber JonesCarolyn and made her aware of above. Please reconsult if future social work needs arise. CSW signing off.   Baker Hughes IncorporatedBailey Willamina Grieshop, LCSW [phone]

## 2016-06-14 NOTE — Discharge Summary (Signed)
[facility] - [place] at [facility]  Anthony Blankenship, 82 y.o., DOB 14-Jan-1925, MRN [id number]. Admission date: 06/11/2016 Discharge Date 06/14/2016 Primary MD Pcp Not In System Admitting Physician Arnaldo NatalMichael S Diamond, MD  Admission Diagnosis  Bradycardia [R00.1] Near syncope [R55] Hypotension, unspecified hypotension type [I95.9]  Discharge Diagnosis   Active Problems:   Near syncope due to hypotension  Gait instability and recurrent falls Parkinson's disease Anxiety Coronary artery disease Depression Essential hypertension Restless leg syndrome       Hospital Course patient with past medical history of coronary artery disease status post CABG presents emergency department from his assisted living facility due to an episode of near syncope. The patient states that caregivers observed him slumped forward in his chair. Patient was noted to have hypotension in the ER. He was given IV fluids also underwent echocardiogram which showed no source for his near syncope. He will did not have any arrhythmia. He does have recurrent falls and patient and family were interested in him going to a nursing home however he was seen by physical therapy recommended going back to assisted living he is being arranged to have PT OT at at the facility.           Consults  None  Significant Tests:  See full reports for all details    Dg Chest 1 View  Result Date: 06/12/2016 CLINICAL DATA:  Near-syncope episode at nursing home today. History of Parkinson's disease, hypertension. EXAM: CHEST 1 VIEW COMPARISON:  Chest radiograph April 21, 2016 FINDINGS: Cardiomediastinal silhouette is normal. Calcified aortic knob. Status post median sternotomy for CABG. No pleural effusion or focal consolidation. No pneumothorax. Soft tissue planes and included osseous structures are nonsuspicious. IMPRESSION: No acute cardiopulmonary process, stable examination. Electronically Signed   By: Awilda Metroourtnay   Bloomer M.D.   On: 06/12/2016 00:33       Today   Subjective:   Anthony ReapEdward Blankenship patient doing well denies any symptoms today  Objective:   Blood pressure (!) 111/55, pulse 68, temperature 98 F (36.7 C), temperature source Oral, resp. rate 16, height 5\' 9"  (1.753 m), weight 142 lb (64.4 kg), SpO2 98 %.  .  Intake/Output Summary (Last 24 hours) at 06/14/16 1428 Last data filed at 06/14/16 1100  Gross per 24 hour  Intake                0 ml  Output              150 ml  Net             -150 ml    Exam VITAL SIGNS: Blood pressure (!) 111/55, pulse 68, temperature 98 F (36.7 C), temperature source Oral, resp. rate 16, height 5\' 9"  (1.753 m), weight 142 lb (64.4 kg), SpO2 98 %.  GENERAL:  82 y.o.-year-old patient lying in the bed with no acute distress.  EYES: Pupils equal, round, reactive to light and accommodation. No scleral icterus. Extraocular muscles intact.  HEENT: Head atraumatic, normocephalic. Oropharynx and nasopharynx clear.  NECK:  Supple, no jugular venous distention. No thyroid enlargement, no tenderness.  LUNGS: Normal breath sounds bilaterally, no wheezing, rales,rhonchi or crepitation. No use of accessory muscles of respiration.  CARDIOVASCULAR: S1, S2 normal. No murmurs, rubs, or gallops.  ABDOMEN: Soft, nontender, nondistended. Bowel sounds present. No organomegaly or mass.  EXTREMITIES: No pedal edema, cyanosis, or clubbing.  NEUROLOGIC: Cranial nerves II through XII are intact. Muscle strength 5/5 in all extremities. Sensation intact. Gait not  checked.  PSYCHIATRIC: The patient is alert and oriented x 3.  SKIN: No obvious rash, lesion, or ulcer.   Data Review     CBC w Diff: Lab Results  Component Value Date   WBC 5.5 06/11/2016   HGB 12.1 (L) 06/11/2016   HGB 14.0 10/18/2014   HCT 34.7 (L) 06/11/2016   HCT 40.4 10/18/2014   PLT 119 (L) 06/11/2016   PLT 140 (L) 10/18/2014   LYMPHOPCT 32 06/11/2016   MONOPCT 6 06/11/2016   EOSPCT 1 06/11/2016    BASOPCT 0 06/11/2016   CMP: Lab Results  Component Value Date   NA 137 06/11/2016   NA 140 10/18/2014   K 4.0 06/11/2016   K 3.8 10/18/2014   CL 103 06/11/2016   CL 108 10/18/2014   CO2 28 06/11/2016   CO2 24 10/18/2014   BUN 31 (H) 06/11/2016   BUN 46 (H) 10/18/2014   CREATININE 1.02 06/14/2016   CREATININE 1.62 (H) 10/18/2014   PROT 6.0 (L) 06/11/2016   PROT 6.9 10/18/2014   ALBUMIN 3.6 06/11/2016   ALBUMIN 4.4 10/18/2014   BILITOT 0.6 06/11/2016   BILITOT 0.9 10/18/2014   ALKPHOS 41 06/11/2016   ALKPHOS 58 10/18/2014   AST 16 06/11/2016   AST 24 10/18/2014   ALT <5 (L) 06/11/2016   ALT <5 10/18/2014  .  Micro Results Recent Results (from the past 240 hour(s))  MRSA PCR Screening     Status: None   Collection Time: 06/12/16  2:53 AM  Result Value Ref Range Status   MRSA by PCR NEGATIVE NEGATIVE Final    Comment:        The GeneXpert MRSA Assay (FDA approved for NASAL specimens only), is one component of a comprehensive MRSA colonization surveillance program. It is not intended to diagnose MRSA infection nor to guide or monitor treatment for MRSA infections.         Code Status Orders        Start     Ordered   06/12/16 0256  Do not attempt resuscitation (DNR)  Continuous    Question Answer Comment  In the event of cardiac or respiratory ARREST Do not call a "code blue"   In the event of cardiac or respiratory ARREST Do not perform Intubation, CPR, defibrillation or ACLS   In the event of cardiac or respiratory ARREST Use medication by any route, position, wound care, and other measures to relive pain and suffering. May use oxygen, suction and manual treatment of airway obstruction as needed for comfort.      06/12/16 0255    Code Status History    Date Active Date Inactive Code Status Order ID Comments User Context   04/25/2016  1:08 AM 04/25/2016  6:35 PM DNR [id number]  Tonye Royalty, DO Inpatient   04/25/2016  1:07 AM 04/25/2016  1:08 AM  Full Code [id number]  Tonye Royalty, DO Inpatient   05/14/2015  7:05 PM 05/15/2015  4:51 PM DNR [id number]  Altamese Dilling, MD ED          Follow-up Information    pcp at alf Follow up in 1 week(s).           Discharge Medications   Allergies as of 06/14/2016      Reactions   Other Other (See Comments)   Uncoded Allergy. Allergen: Sleeping Medications      Medication List    TAKE these medications   acetaminophen 325 MG tablet Commonly known as:  TYLENOL  Take 650 mg by mouth every 6 (six) hours as needed for mild pain.   carbidopa-levodopa-entacapone 50-200-200 MG tablet Commonly known as:  STALEVO Take 1 tablet by mouth 4 (four) times daily.   diazepam 2 MG tablet Commonly known as:  VALIUM Take 2 mg by mouth every 12 (twelve) hours as needed for anxiety.   levothyroxine 50 MCG tablet Commonly known as:  SYNTHROID, LEVOTHROID Take 50 mcg by mouth daily.   loperamide 2 MG tablet Commonly known as:  IMODIUM A-D Take 2 mg by mouth as needed for diarrhea or loose stools. Do not exceed 8 doses in 24 hours   rOPINIRole 1 MG tablet Commonly known as:  REQUIP Take 1 tablet (1 mg total) by mouth 3 (three) times daily.   senna-docusate 8.6-50 MG tablet Commonly known as:  Senokot-S Take 1 tablet by mouth at bedtime as needed for mild constipation.   traMADol 50 MG tablet Commonly known as:  ULTRAM Take 50 mg by mouth 2 (two) times daily.          Total Time in preparing paper work, data evaluation and todays exam - 35 minutes  Auburn BilberryPATEL, Raylen Tangonan M.D on 06/14/2016 at 2:28 PM  [facility]   Office  [phone]

## 2016-06-14 NOTE — Discharge Instructions (Signed)
[facility] - [place] at [facility]  DIET:  Regular diet  DISCHARGE CONDITION:  Stable  ACTIVITY:  Activity as tolerated use walker or wheel chair at all time  OXYGEN:  Home Oxygen: No.   Oxygen Delivery: room air  DISCHARGE LOCATION:  asslisted living    ADDITIONAL DISCHARGE INSTRUCTION:   If you experience worsening of your admission symptoms, develop shortness of breath, life threatening emergency, suicidal or homicidal thoughts you must seek medical attention immediately by calling 911 or calling your MD immediately  if symptoms less severe.  You Must read complete instructions/literature along with all the possible adverse reactions/side effects for all the Medicines you take and that have been prescribed to you. Take any new Medicines after you have completely understood and accpet all the possible adverse reactions/side effects.   Please note  You were cared for by a hospitalist during your hospital stay. If you have any questions about your discharge medications or the care you received while you were in the hospital after you are discharged, you can call the unit and asked to speak with the hospitalist on call if the hospitalist that took care of you is not available. Once you are discharged, your primary care physician will handle any further medical issues. Please note that NO REFILLS for any discharge medications will be authorized once you are discharged, as it is imperative that you return to your primary care physician (or establish a relationship with a primary care physician if you do not have one) for your aftercare needs so that they can reassess your need for medications and monitor your lab values.

## 2016-06-14 NOTE — Progress Notes (Signed)
[facility]  Pt. To be transported by EMS to the facility.  Patient aware.

## 2016-06-14 NOTE — Care Management (Signed)
Patient active with [facility]. [facility] notified of discharge and resumption of care need.

## 2016-06-14 NOTE — [place] FL2 (Signed)
  [place] MEDICAID FL2 LEVEL OF CARE SCREENING TOOL     IDENTIFICATION  Patient Name: Anthony Blankenship Birthdate: Nov 25, 1924 Sex: male Admission Date (Current Location): 06/11/2016  [facility] [place]   Facility and Address:  [facility], [address], [place], [place] [address]      Provider Number: [id number]  Attending Physician Name and Address:  Auburn BilberryShreyang Patel, MD  Relative Name and Phone Number:       Current Level of Care: Hospital Recommended Level of Care: Assisted Living Facility Prior Approval Number:    Date Approved/Denied:   PASRR Number:  ( [id number] )  Discharge Plan: Domiciliary (Rest home)    Current Diagnoses: Patient Active Problem List   Diagnosis Date Noted  . Near syncope 06/12/2016  . Lower GI bleed 04/25/2016  . GI bleed 04/25/2016  . Restlessness 05/14/2015    Orientation RESPIRATION BLADDER Height & Weight     Self, Situation, Place  Normal Incontinent Weight: 142 lb (64.4 kg) Height:  5\' 9"  (175.3 cm)  BEHAVIORAL SYMPTOMS/MOOD NEUROLOGICAL BOWEL NUTRITION STATUS   (none)  (Parkinson's disease ) Continent Diet (Regular Diet )  AMBULATORY STATUS COMMUNICATION OF NEEDS Skin   Limited Assist Verbally Normal                       Personal Care Assistance Level of Assistance  Bathing, Feeding, Dressing Bathing Assistance: Limited assistance Feeding assistance: Limited assistance Dressing Assistance: Limited assistance     Functional Limitations Info  Sight, Hearing, Speech Sight Info: Adequate Hearing Info: Adequate Speech Info: Adequate    SPECIAL CARE FACTORS FREQUENCY  PT (By licensed PT), OT (By licensed OT)     PT Frequency:  (2-3 days per home health. ) OT Frequency:  (2-3 days per week per home health. )            Contractures      Additional Factors Info  Code Status, Allergies Code Status Info:  (DNR ) Allergies Info:  (Sleeping Medications. )           Discharge Medications: Please see discharge summary for a list of discharge medications. Medication List    TAKE these medications   acetaminophen 325 MG tablet Commonly known as:  TYLENOL Take 650 mg by mouth every 6 (six) hours as needed for mild pain.  carbidopa-levodopa-entacapone 50-200-200 MG tablet Commonly known as:  STALEVO Take 1 tablet by mouth 4 (four) times daily.  diazepam 2 MG tablet Commonly known as:  VALIUM Take 2 mg by mouth every 12 (twelve) hours as needed for anxiety.  levothyroxine 50 MCG tablet Commonly known as:  SYNTHROID, LEVOTHROID Take 50 mcg by mouth daily.  loperamide 2 MG tablet Commonly known as:  IMODIUM A-D Take 2 mg by mouth as needed for diarrhea or loose stools. Do not exceed 8 doses in 24 hours  rOPINIRole 1 MG tablet Commonly known as:  REQUIP Take 1 tablet (1 mg total) by mouth 3 (three) times daily.  senna-docusate 8.6-50 MG tablet Commonly known as:  Senokot-S Take 1 tablet by mouth at bedtime as needed for mild constipation.  traMADol 50 MG tablet Commonly known as:  ULTRAM Take 50 mg by mouth 2 (two) times daily.  Relevant Imaging Results: Relevant Lab Results: Additional Information  (SSN: [id number])  Shaine Mount, Darleen CrockerBailey M, LCSW

## 2016-07-05 ENCOUNTER — Ambulatory Visit (INDEPENDENT_AMBULATORY_CARE_PROVIDER_SITE_OTHER): Payer: Medicare Other | Admitting: Internal Medicine

## 2016-07-05 ENCOUNTER — Encounter (INDEPENDENT_AMBULATORY_CARE_PROVIDER_SITE_OTHER): Payer: Self-pay

## 2016-07-05 ENCOUNTER — Ambulatory Visit (INDEPENDENT_AMBULATORY_CARE_PROVIDER_SITE_OTHER): Payer: Medicare Other

## 2016-07-05 ENCOUNTER — Encounter: Payer: Self-pay | Admitting: Internal Medicine

## 2016-07-05 VITALS — BP 142/76 | HR 47 | Ht 69.0 in | Wt 148.0 lb

## 2016-07-05 DIAGNOSIS — R42 Dizziness and giddiness: Secondary | ICD-10-CM | POA: Diagnosis not present

## 2016-07-05 DIAGNOSIS — I10 Essential (primary) hypertension: Secondary | ICD-10-CM | POA: Diagnosis not present

## 2016-07-05 DIAGNOSIS — I25709 Atherosclerosis of coronary artery bypass graft(s), unspecified, with unspecified angina pectoris: Secondary | ICD-10-CM

## 2016-07-05 DIAGNOSIS — R55 Syncope and collapse: Secondary | ICD-10-CM

## 2016-07-05 DIAGNOSIS — I2510 Atherosclerotic heart disease of native coronary artery without angina pectoris: Secondary | ICD-10-CM

## 2016-07-05 NOTE — Patient Instructions (Addendum)
Medication Instructions:  Your physician recommends that you continue on your current medications as directed. Please refer to the Current Medication list given to you today.   Labwork: - None ordered.   Testing/Procedures: Your physician has recommended that you wear an ZIO event monitor FOR 14 DAYS. Event monitors are medical devices that record the heart's electrical activity. Doctors most often us these monitors to diagnose arrhythmias. Arrhythmias are problems with the speed or rhythm of the heartbeat. The monitor is a small, portable device. You can wear one while you do your normal daily activities. This is usually used to diagnose what is causing palpitations/syncope (passing out).  Dr End would like you to INCREASE YOUR FLUID INTAKE.   Follow-Up: Your physician recommends that you schedule a follow-up appointment in: 1 MONTH WITH DR END.   If you need a refill on your cardiac medications before your next appointment, please call your pharmacy.

## 2016-07-05 NOTE — Progress Notes (Signed)
New Outpatient Visit Date: 07/05/2016  Primary Care Provider:  Chief Complaint: Dizziness  HPI:  [name] is a 83 y.o. year-old male with history of CAD s/p CABG, HTN, Parkinson's disease, depression/anxiety, and restless legs, who presents for evaluation of near-syncope. The patient reports that he has had numerous episodes of lightheadedness and falls, though these seem to have worsened significantly over the last month. He was hospitalized at [facility] from 12/16-12/19/17 after almost passing out. Per the chart, Mr. Schembri slumped forward in his chair at assisted living and was transported to the ED where he was found to be hypotensive.  His blood pressure normalized with IV hydration; work-up did not reveal a cause of his hypotension.  He was discharged back to assisted living with PT/OT.  Of note, the patient has a history of recurrent falls. He typically ambulates with a cane or walker, though he also uses a wheelchair from time to time. He reports that his lightheadedness typically occurs when he first stands up or has been standing for about a minute. He does not have lightheadedness when seated or lying. He does not have warning symptoms such as palpitations, nausea, and chest pain. He is unsure of previous workup but does not believe that he has worn a monitor in the past.  The patient's cardiac history is notable for coronary artery disease with CABG in 2008. The patient reports at the time he was experiencing considerable chest tightness and squeezing that led to heart catheterization and ultimately CABG at [facility]. Mr. Ursua denies having any similar symptoms since that time. He has not followed up routinely with cardiology. The patient currently resides in assisted living and has 24/7 assistance with him.  --------------------------------------------------------------------------------------------------  Cardiovascular History & Procedures: Cardiovascular Problems:  Recurrent falls  and near-syncope  CAD s/p CABG  Risk Factors:  Known CAD, hypertension, male gender, and age > 85  Cath/PCI:  LHC (05/2007): 80% LMCA (further details not available)  CV Surgery:  CABG (06/25/07, [facility]): LIMA->LAD, SVG->D1, and SVG->OM1  EP Procedures and Devices:  None  Non-Invasive Evaluation(s):  TTE (06/12/16): Normal LV size with moderate LVH.  LVEF 55-60% with normal diastolic parameters.  Mild AR and MR.  Moderate-severe TR.  Normal RV size and function.  Recent CV Pertinent Labs: Lab Results  Component Value Date   INR 1.0 09/28/2011   K 4.0 06/11/2016   K 3.8 10/18/2014   BUN 31 (H) 06/11/2016   BUN 46 (H) 10/18/2014   CREATININE 1.02 06/14/2016   CREATININE 1.62 (H) 10/18/2014    --------------------------------------------------------------------------------------------------  Past Medical History:  Diagnosis Date  . Anxiety   . Coronary artery disease   . Depression   . Hypertension   . Kidney stones   . Parkinson's disease (HCC)   . Restless leg syndrome     Past Surgical History:  Procedure Laterality Date  . APPENDECTOMY    . CORONARY ARTERY BYPASS GRAFT      Outpatient Encounter Prescriptions as of 07/05/2016  Medication Sig  . acetaminophen (TYLENOL) 325 MG tablet Take 650 mg by mouth every 6 (six) hours as needed for mild pain.   . carbidopa-levodopa-entacapone (STALEVO) 50-200-200 MG tablet Take 1 tablet by mouth 4 (four) times daily.  Marland Kitchen levothyroxine (SYNTHROID, LEVOTHROID) 50 MCG tablet Take 50 mcg by mouth daily.   Marland Kitchen loperamide (IMODIUM A-D) 2 MG tablet Take 2 mg by mouth as needed for diarrhea or loose stools. Do not exceed 8 doses in 24 hours  . rOPINIRole (  REQUIP) 1 MG tablet Take 1 tablet (1 mg total) by mouth 3 (three) times daily.  Marland Kitchen. senna-docusate (SENOKOT-S) 8.6-50 MG tablet Take 1 tablet by mouth at bedtime as needed for mild constipation.  . traMADol (ULTRAM) 50 MG tablet Take 50 mg by mouth 2 (two) times daily.  .  [DISCONTINUED] diazepam (VALIUM) 2 MG tablet Take 2 mg by mouth every 12 (twelve) hours as needed for anxiety.   No facility-administered encounter medications on file as of 07/05/2016.     Allergies: Other  Social History   Social History  . Marital status: Divorced    Spouse name: N/A  . Number of children: N/A  . Years of education: N/A   Occupational History  . Not on file.   Social History Main Topics  . Smoking status: Never Smoker  . Smokeless tobacco: Never Used  . Alcohol use No  . Drug use: No  . Sexual activity: Not on file   Other Topics Concern  . Not on file   Social History Narrative  . No narrative on file    Family History  Problem Relation Age of Onset  . Liver disease Father   . Heart disease Mother   . Kidney disease Neg Hx   . Prostate cancer Neg Hx   . Bladder Cancer Neg Hx     Review of Systems: The patient reports considerable whole body "restlessness" that has improved somewhat with ropinirole. Otherwise, a 12-system review of systems was performed and was negative except as noted in the HPI.  --------------------------------------------------------------------------------------------------  Physical Exam: BP (!) 142/76 (BP Location: Right Arm, Patient Position: Sitting, Cuff Size: Normal)   Pulse (!) 47   Ht 5\' 9"  (1.753 m)   Wt 148 lb (67.1 kg)   BMI 21.86 kg/m   Position Blood pressure (mmHg) Heart rate (bpm)  Lying 159/81 53  Sitting 120/68 58  Standing 103/59 58  Standing (3 minutes) 108/63 59   General:  Frail, elderly man seated comfortably in the exam room. He is accompanied by a woman who provides transportation for him. HEENT: No conjunctival pallor or scleral icterus.  Moist mucous membranes.  OP clear. Neck: Supple without lymphadenopathy, thyromegaly, JVD, or HJR.  No carotid bruit. Lungs: Normal work of breathing.  Clear to auscultation bilaterally without wheezes or crackles. Heart: Regular rate and rhythm without  murmurs, rubs, or gallops.  Non-displaced PMI. Abd: Bowel sounds present.  Soft, NT/ND without hepatosplenomegaly Ext: No lower extremity edema.  Radial, PT, and DP pulses are 2+ bilaterally Skin: warm and dry without rash Neuro: CNIII-XII intact.  Significant resting tremor and bradykinesia noted. The patient has a shuffling gait, ambulating with a cane. Strength and fine touch sensation are grossly intact. Psych: Normal mood and affect.  EKG:  Sinus bradycardia (rate 47 bpm) with incomplete right bundle-branch block. Compared with 07/04/15, heart rate and QRS duration have decreased (I have personally reviewed both tracings).  Lab Results  Component Value Date   WBC 5.5 06/11/2016   HGB 12.1 (L) 06/11/2016   HCT 34.7 (L) 06/11/2016   MCV 92.3 06/11/2016   PLT 119 (L) 06/11/2016    Lab Results  Component Value Date   NA 137 06/11/2016   K 4.0 06/11/2016   CL 103 06/11/2016   CO2 28 06/11/2016   BUN 31 (H) 06/11/2016   CREATININE 1.02 06/14/2016   GLUCOSE 156 (H) 06/11/2016   ALT <5 (L) 06/11/2016    No results found for: CHOL, HDL, LDLCALC,  LDLDIRECT, TRIG, CHOLHDL   --------------------------------------------------------------------------------------------------  ASSESSMENT AND PLAN: Lightheadedness and near-syncope/syncope I suspect the patient's recurrent lightheadedness, falls, and near-syncope/syncope are related to autonomic dysfunction. He demonstrates a significant orthostatic drop in his blood pressure today. This is likely multifactorial, including a degree of intravascular volume depletion and Parkinson's disease. I have encouraged the patient to increase his fluid intake. His EKG today also demonstrates sinus bradycardia, consistent with prior tracings. Review of his telemetry during hospitalization last month is notable for predominantly sinus rhythm with some rates up to at least 70 bpm. An episode of brief nonsustained ventricular tachycardia was noted. I attempted  to ambulate the patient in the office today, which he did with difficulty due to his Parkinson's disease. However, his heart rate did climb into the low 60s with a brief walk. I therefore believe that chronotropic incompetence is not the primary etiology of his symptoms. We have agreed to obtain a 14-day event monitor to evaluate his heart rate response and also to screen for other arrhythmias that could be precipitating his lightheadedness and near-syncope/syncope. Echocardiogram during his most recent hospitalization showed preserved LV function without any other significant abnormalities to explain his recurrent lightheadedness. I advised the patient to avoid situations in which falling could be harmful.  Coronary artery disease The patient does not have any symptoms to suggest significant coronary insufficiency. However, given his history of CAD and CABG 10 years ago, it is conceivable that silent ischemia may be playing a role in his lightheadedness and near-syncope/syncope. Given his age and frailty, I think it is reasonable to hold off on adding a statin at this time. I am also hesitant to add aspirin for secondary prevention, given his frequent falls.  Hypertension Resting lying blood pressure is modestly elevated, the patient exhibits a significant drop when standing. As above, I have asked him to increase his fluid intake. We will not add any antihypertensive medications at this time.  Follow-up: Return to clinic in one month.  Yvonne Kendall, MD 07/05/2016 3:34 PM

## 2016-07-19 DIAGNOSIS — R55 Syncope and collapse: Secondary | ICD-10-CM | POA: Diagnosis not present

## 2016-07-19 DIAGNOSIS — I25709 Atherosclerosis of coronary artery bypass graft(s), unspecified, with unspecified angina pectoris: Secondary | ICD-10-CM

## 2016-08-08 NOTE — Progress Notes (Deleted)
New Outpatient Visit Date: 08/09/2016  Primary Care Provider:  Chief Complaint: Dizziness  HPI:  [name] is a 83 y.o. year-old male with history of CAD s/p CABG, HTN, Parkinson's disease, depression/anxiety, and restless legs, who presents for evaluation of near-syncope. The patient reports that he has had numerous episodes of lightheadedness and falls, though these seem to have worsened significantly over the last month. He was hospitalized at [facility] from 12/16-12/19/17 after almost passing out. Per the chart, [name] slumped forward in his chair at assisted living and was transported to the ED where he was found to be hypotensive.  His blood pressure normalized with IV hydration; work-up did not reveal a cause of his hypotension.  He was discharged back to assisted living with PT/OT.  Of note, the patient has a history of recurrent falls. He typically ambulates with a cane or walker, though he also uses a wheelchair from time to time. He reports that his lightheadedness typically occurs when he first stands up or has been standing for about a minute. He does not have lightheadedness when seated or lying. He does not have warning symptoms such as palpitations, nausea, and chest pain. He is unsure of previous workup but does not believe that he has worn a monitor in the past.  The patient's cardiac history is notable for coronary artery disease with CABG in 2008. The patient reports at the time he was experiencing considerable chest tightness and squeezing that led to heart catheterization and ultimately CABG at [facility]. [name] denies having any similar symptoms since that time. He has not followed up routinely with cardiology. The patient currently resides in assisted living and has 24/7 assistance with him.  --------------------------------------------------------------------------------------------------  Cardiovascular History & Procedures: Cardiovascular Problems:  Recurrent falls  and near-syncope  CAD s/p CABG  Risk Factors:  Known CAD, hypertension, male gender, and age > 6055  Cath/PCI:  LHC (05/2007): 80% LMCA (further details not available)  CV Surgery:  CABG (06/25/07, [facility]): LIMA->LAD, SVG->D1, and SVG->OM1  EP Procedures and Devices:  None  Non-Invasive Evaluation(s):  TTE (06/12/16): Normal LV size with moderate LVH.  LVEF 55-60% with normal diastolic parameters.  Mild AR and MR.  Moderate-severe TR.  Normal RV size and function.  Recent CV Pertinent Labs: Lab Results  Component Value Date   INR 1.0 09/28/2011   K 4.0 06/11/2016   K 3.8 10/18/2014   BUN 31 (H) 06/11/2016   BUN 46 (H) 10/18/2014   CREATININE 1.02 06/14/2016   CREATININE 1.62 (H) 10/18/2014    --------------------------------------------------------------------------------------------------  Past Medical History:  Diagnosis Date  . Anxiety   . Coronary artery disease   . Depression   . Hypertension   . Kidney stones   . Parkinson's disease (HCC)   . Restless leg syndrome     Past Surgical History:  Procedure Laterality Date  . APPENDECTOMY    . CARDIAC CATHETERIZATION    . CORONARY ARTERY BYPASS GRAFT      Outpatient Encounter Prescriptions as of 08/09/2016  Medication Sig  . acetaminophen (TYLENOL) 325 MG tablet Take 650 mg by mouth every 6 (six) hours as needed for mild pain.   . carbidopa-levodopa-entacapone (STALEVO) 50-200-200 MG tablet Take 1 tablet by mouth 4 (four) times daily.  Marland Kitchen. levothyroxine (SYNTHROID, LEVOTHROID) 50 MCG tablet Take 50 mcg by mouth daily.   Marland Kitchen. loperamide (IMODIUM A-D) 2 MG tablet Take 2 mg by mouth as needed for diarrhea or loose stools. Do not exceed 8 doses  in 24 hours  . rOPINIRole (REQUIP) 1 MG tablet Take 1 tablet (1 mg total) by mouth 3 (three) times daily.  Marland Kitchen senna-docusate (SENOKOT-S) 8.6-50 MG tablet Take 1 tablet by mouth at bedtime as needed for mild constipation.  . traMADol (ULTRAM) 50 MG tablet Take 50 mg by mouth 2  (two) times daily.   No facility-administered encounter medications on file as of 08/09/2016.     Allergies: Other  Social History   Social History  . Marital status: Divorced    Spouse name: N/A  . Number of children: N/A  . Years of education: N/A   Occupational History  . Not on file.   Social History Main Topics  . Smoking status: Never Smoker  . Smokeless tobacco: Never Used  . Alcohol use No  . Drug use: No  . Sexual activity: Not on file   Other Topics Concern  . Not on file   Social History Narrative  . No narrative on file    Family History  Problem Relation Age of Onset  . Liver disease Father   . Heart disease Mother   . Kidney disease Neg Hx   . Prostate cancer Neg Hx   . Bladder Cancer Neg Hx     Review of Systems: The patient reports considerable whole body "restlessness" that has improved somewhat with ropinirole. Otherwise, a 12-system review of systems was performed and was negative except as noted in the HPI.  --------------------------------------------------------------------------------------------------  Physical Exam: There were no vitals taken for this visit.  Position Blood pressure (mmHg) Heart rate (bpm)  Lying 159/81 53  Sitting 120/68 58  Standing 103/59 58  Standing (3 minutes) 108/63 59   General:  Frail, elderly man seated comfortably in the exam room. He is accompanied by a woman who provides transportation for him. HEENT: No conjunctival pallor or scleral icterus.  Moist mucous membranes.  OP clear. Neck: Supple without lymphadenopathy, thyromegaly, JVD, or HJR.  No carotid bruit. Lungs: Normal work of breathing.  Clear to auscultation bilaterally without wheezes or crackles. Heart: Regular rate and rhythm without murmurs, rubs, or gallops.  Non-displaced PMI. Abd: Bowel sounds present.  Soft, NT/ND without hepatosplenomegaly Ext: No lower extremity edema.  Radial, PT, and DP pulses are 2+ bilaterally Skin: warm and dry  without rash Neuro: CNIII-XII intact.  Significant resting tremor and bradykinesia noted. The patient has a shuffling gait, ambulating with a cane. Strength and fine touch sensation are grossly intact. Psych: Normal mood and affect.  EKG:  Sinus bradycardia (rate 47 bpm) with incomplete right bundle-branch block. Compared with 07/04/15, heart rate and QRS duration have decreased (I have personally reviewed both tracings).  Lab Results  Component Value Date   WBC 5.5 06/11/2016   HGB 12.1 (L) 06/11/2016   HCT 34.7 (L) 06/11/2016   MCV 92.3 06/11/2016   PLT 119 (L) 06/11/2016    Lab Results  Component Value Date   NA 137 06/11/2016   K 4.0 06/11/2016   CL 103 06/11/2016   CO2 28 06/11/2016   BUN 31 (H) 06/11/2016   CREATININE 1.02 06/14/2016   GLUCOSE 156 (H) 06/11/2016   ALT <5 (L) 06/11/2016    No results found for: CHOL, HDL, LDLCALC, LDLDIRECT, TRIG, CHOLHDL   --------------------------------------------------------------------------------------------------  ASSESSMENT AND PLAN: Lightheadedness and near-syncope/syncope   Coronary artery disease   Hypertension   Follow-up: Return to clinic in one month.  Yvonne Kendall, MD 08/08/2016 3:15 PM

## 2016-08-09 ENCOUNTER — Ambulatory Visit: Payer: Medicare Other | Admitting: Internal Medicine

## 2016-08-09 ENCOUNTER — Telehealth: Payer: Self-pay | Admitting: *Deleted

## 2016-08-09 NOTE — Telephone Encounter (Signed)
Patient has not mailed monitor back to company. They are putting it in the mail today and when we receive the report we can then schedule him to come back in to see Dr. Okey DupreEnd. She verbalized understanding and has no further questions at this time.

## 2016-08-09 NOTE — Telephone Encounter (Signed)
-----   Message from Kendrick FriesMarina C Lopez, [place] sent at 08/09/2016  1:10 PM EST ----- Hello Ladies,   Pt is coming in to see End today. Does anyone know any information on monitor status not in patient's chart.   Thank you,  Jearld AdjutantMarina

## 2016-09-07 ENCOUNTER — Encounter: Payer: Self-pay | Admitting: Internal Medicine

## 2016-09-07 ENCOUNTER — Ambulatory Visit (INDEPENDENT_AMBULATORY_CARE_PROVIDER_SITE_OTHER): Payer: Medicare Other | Admitting: Internal Medicine

## 2016-09-07 VITALS — BP 102/60 | HR 51 | Ht 69.0 in | Wt 141.0 lb

## 2016-09-07 DIAGNOSIS — I25119 Atherosclerotic heart disease of native coronary artery with unspecified angina pectoris: Secondary | ICD-10-CM | POA: Diagnosis not present

## 2016-09-07 DIAGNOSIS — R5383 Other fatigue: Secondary | ICD-10-CM

## 2016-09-07 DIAGNOSIS — R42 Dizziness and giddiness: Secondary | ICD-10-CM | POA: Diagnosis not present

## 2016-09-07 DIAGNOSIS — I951 Orthostatic hypotension: Secondary | ICD-10-CM | POA: Diagnosis not present

## 2016-09-07 NOTE — Progress Notes (Signed)
Follow-up Outpatient Visit Date: 09/07/2016  Primary Care Provider: Pcp Not In System No address on file  Chief Complaint: Dizziness  HPI:  Anthony Blankenship is a 83 y.o. year-old male with history of CAD s/p CABG, HTN, Parkinson's disease, depression/anxiety, and restless legs, who presents for follow-up of orthostatic hypotension and bradycardia. I last saw the patient on 07/05/16 after a preceding hospitalization for syncope. At our follow-up he continued to have orthostatic lightheadedness with significant positional BP drop. His resting heart rate was also found to be low. We proceeded with ambulatory event monitor, which revealed predominantly sinus bradycardia but appropriate heart rate response during the day. Brief episodes of PSVT were also identified.  Today, Anthony Blankenship reports that he feels about the same as at our last visit. He continues to have generalized weakness and fatigue. This is most pronounced when he stands up and first begins walking. He is able to ambulate with a walker or by holding onto a wheelchair, albeit at a slow pace. He has not fallen or passed out. He notes brief chest tightness when he first begins to walk, lasting a few seconds. He denies shortness of breath, edema, and palpitations. He is concerned that some of his symptoms could be related to overmedication. He also notes continued "restlessness" throughout his body.  --------------------------------------------------------------------------------------------------  Cardiovascular History & Procedures: Cardiovascular Problems:  Recurrent falls and near-syncope  CAD s/p CABG  Risk Factors:  Known CAD, hypertension, male gender, and age > 31  Cath/PCI:  LHC (05/2007): 80% LMCA (further details not available)  CV Surgery:  CABG (06/25/07, [facility]): LIMA->LAD, SVG->D1, and SVG->OM1  EP Procedures and Devices:  14-day event monitor (07/05/16): Predominant rhythm was sinus with an average rate of 56  bpm (range 37 and 92 bpm in sinus rhythm). Longest RR interval was 1.6 seconds. Rare isolated PACs and atrial couplets were observed. Rare isolated PVCs also noted. 4 episodes of supraventricular tachycardia lasting up to 13 beats were noted with a maximal rate of 154 bpm. Top a chill rhythm versus junctional rhythm were noted.  Non-Invasive Evaluation(s):  TTE (06/12/16): Normal LV size with moderate LVH.  LVEF 55-60% with normal diastolic parameters.  Mild AR and MR.  Moderate-severe TR.  Normal RV size and function.  Recent CV Pertinent Labs: Lab Results  Component Value Date   INR 1.0 09/28/2011   K 4.0 06/11/2016   K 3.8 10/18/2014   BUN 31 (H) 06/11/2016   BUN 46 (H) 10/18/2014   CREATININE 1.02 06/14/2016   CREATININE 1.62 (H) 10/18/2014    Past medical and surgical history were reviewed and updated in EPIC.  Outpatient Encounter Prescriptions as of 09/07/2016  Medication Sig  . acetaminophen (TYLENOL) 325 MG tablet Take 650 mg by mouth every 6 (six) hours as needed for mild pain.   . carbidopa-levodopa-entacapone (STALEVO) 50-200-200 MG tablet Take 1 tablet by mouth 4 (four) times daily.  Marland Kitchen levothyroxine (SYNTHROID, LEVOTHROID) 50 MCG tablet Take 50 mcg by mouth daily.   Marland Kitchen loperamide (IMODIUM A-D) 2 MG tablet Take 2 mg by mouth as needed for diarrhea or loose stools. Do not exceed 8 doses in 24 hours  . meloxicam (MOBIC) 7.5 MG tablet Take 7.5 mg by mouth 2 (two) times daily.  Marland Kitchen rOPINIRole (REQUIP) 1 MG tablet Take 1 tablet (1 mg total) by mouth 3 (three) times daily.  Marland Kitchen senna-docusate (SENOKOT-S) 8.6-50 MG tablet Take 1 tablet by mouth at bedtime as needed for mild constipation.  . traMADol (ULTRAM) 50  MG tablet Take 50 mg by mouth 2 (two) times daily.  . Vitamin D, Ergocalciferol, (DRISDOL) 50000 units CAPS capsule Take 50,000 Units by mouth every 7 (seven) days.   No facility-administered encounter medications on file as of 09/07/2016.     Allergies: Other  Social  History   Social History  . Marital status: Divorced    Spouse name: N/A  . Number of children: N/A  . Years of education: N/A   Occupational History  . Not on file.   Social History Main Topics  . Smoking status: Never Smoker  . Smokeless tobacco: Never Used  . Alcohol use No  . Drug use: No  . Sexual activity: Not on file   Other Topics Concern  . Not on file   Social History Narrative  . No narrative on file    Family History  Problem Relation Age of Onset  . Liver disease Father   . Heart disease Mother   . Kidney disease Neg Hx   . Prostate cancer Neg Hx   . Bladder Cancer Neg Hx     Review of Systems: A 12-system review of systems was performed and was negative except as noted in the HPI.  --------------------------------------------------------------------------------------------------  Physical Exam: BP 102/60 (BP Location: Left Arm, Patient Position: Sitting, Cuff Size: Normal)   Pulse (!) 51   Ht 5\' 9"  (1.753 m)   Wt 141 lb (64 kg)   BMI 20.82 kg/m   General:  Thin, elderly man seated comfortably on the exam table. He is accompanied by a caregiver. HEENT: No conjunctival pallor or scleral icterus.  Moist mucous membranes.  OP clear. Neck: Supple without lymphadenopathy, thyromegaly, JVD, or HJR.  No carotid bruit. Lungs: Normal work of breathing.  Clear to auscultation bilaterally without wheezes or crackles. Heart: Bradycardic but regular with 1/6 systolic murmur. No rubs or gallops.  Non-displaced PMI. Abd: Bowel sounds present.  Soft, NT/ND without hepatosplenomegaly Ext: No lower extremity edema.  Radial, PT, and DP pulses are 2+ bilaterally. Skin: warm and dry without rash  EKG:  Sinus bradycardia (HR 51 bpm) with isolated PAC and incomplete RBBB. PAC is new. Otherwise, there has been no significant change since 07/05/16 (I have personally reviewed both tracings).  Lab Results  Component Value Date   WBC 5.5 06/11/2016   HGB 12.1 (L)  06/11/2016   HCT 34.7 (L) 06/11/2016   MCV 92.3 06/11/2016   PLT 119 (L) 06/11/2016    Lab Results  Component Value Date   NA 137 06/11/2016   K 4.0 06/11/2016   CL 103 06/11/2016   CO2 28 06/11/2016   BUN 31 (H) 06/11/2016   CREATININE 1.02 06/14/2016   GLUCOSE 156 (H) 06/11/2016   ALT <5 (L) 06/11/2016    No results found for: CHOL, HDL, LDLCALC, LDLDIRECT, TRIG, CHOLHDL  --------------------------------------------------------------------------------------------------  ASSESSMENT AND PLAN: Orthostatic hypotension, dizziness, and fatigue I suspect this is likely multifactorial, including Parkinson's disease, medication effects, and age/deconditioning. Bradycardia could certainly be contributing, though sinus rhythm into the 90's was noted on recent event monitor, suggesting chronotropic competence. Worsening of CAD is a consideration as well, given the patient's history of CAD and CABG almost 10 years ago and intermittent chest tightness. We have discussed further evaluation and treatment options, including conservative therapy, stress testing, and cardiac catheterization. The patient would like to avoid invasive procedures, which I think is very reasonable. He has been receiving scheduled meloxicam and tramadol for arthritic pain, though the patient denies having  any pain. I recommend discontinuation of tramadol and use of meloxicam on a PRN basis. I have also asked Mr. Barnick to increase his water intake and to wear thigh-high compression stockings during the day. He should follow-up with his neurologist to see if any changes to his Parkinson's disease medications could be helpful.  Coronary artery disease EKG is without ischemic changes. Patient reports occasional chest tightness when first beginning to walk. Recent echo showed preserved LVEF without wall motion abnormalities. We discussed further evaluation option, including stress testing and cardiac catheterization. We have  agreed to conservative therapy.  Follow-up: Return to clinic in 2 months.  Yvonne Kendall, MD 09/07/2016 10:45 AM

## 2016-09-07 NOTE — Patient Instructions (Signed)
Medication Instructions:  Your physician has recommended you make the following change in your medication:  1- STOP Tramadol. 2- CHANGE taking Meloxicam to on an as needed basis.   Labwork: none  Testing/Procedures: none  Follow-Up: Your physician recommends that you schedule a follow-up appointment in: 2 MONTHS WITH DR END.  Your physician recommends that you schedule a follow-up appointment WITH YOUR NEUROLOGIST IN THE NEAR FUTURE.   Any Other Special Instructions Will Be Listed Below (If Applicable).  Your physician would like you to wear thigh high compression stockings. You will be provided with a prescription and can take it to your nearest medical supply store of your choice.    If you need a refill on your cardiac medications before your next appointment, please call your pharmacy.

## 2016-09-08 ENCOUNTER — Encounter: Payer: Self-pay | Admitting: Internal Medicine

## 2016-09-08 DIAGNOSIS — I25119 Atherosclerotic heart disease of native coronary artery with unspecified angina pectoris: Secondary | ICD-10-CM | POA: Insufficient documentation

## 2016-09-12 ENCOUNTER — Encounter: Payer: Self-pay | Admitting: Emergency Medicine

## 2016-09-12 ENCOUNTER — Emergency Department
Admission: EM | Admit: 2016-09-12 | Discharge: 2016-09-12 | Disposition: A | Discharge status: Home / Self Care | Payer: Medicare Other | Source: Home / Self Care | Attending: Emergency Medicine | Admitting: Emergency Medicine

## 2016-09-12 ENCOUNTER — Inpatient Hospital Stay
Admission: EM | Admit: 2016-09-12 | Discharge: 2016-09-16 | DRG: 640 | Disposition: A | Discharge status: Skilled Nursing Facility | Payer: Medicare Other | Attending: Internal Medicine | Admitting: Internal Medicine

## 2016-09-12 ENCOUNTER — Emergency Department: Payer: Medicare Other

## 2016-09-12 DIAGNOSIS — S0081XA Abrasion of other part of head, initial encounter: Secondary | ICD-10-CM | POA: Diagnosis not present

## 2016-09-12 DIAGNOSIS — Z79899 Other long term (current) drug therapy: Secondary | ICD-10-CM

## 2016-09-12 DIAGNOSIS — Y92129 Unspecified place in nursing home as the place of occurrence of the external cause: Secondary | ICD-10-CM

## 2016-09-12 DIAGNOSIS — Z8679 Personal history of other diseases of the circulatory system: Secondary | ICD-10-CM

## 2016-09-12 DIAGNOSIS — S0003XA Contusion of scalp, initial encounter: Secondary | ICD-10-CM

## 2016-09-12 DIAGNOSIS — E43 Unspecified severe protein-calorie malnutrition: Secondary | ICD-10-CM | POA: Diagnosis present

## 2016-09-12 DIAGNOSIS — W19XXXA Unspecified fall, initial encounter: Secondary | ICD-10-CM | POA: Diagnosis not present

## 2016-09-12 DIAGNOSIS — G20 Parkinson's disease: Secondary | ICD-10-CM | POA: Insufficient documentation

## 2016-09-12 DIAGNOSIS — T148XXA Other injury of unspecified body region, initial encounter: Secondary | ICD-10-CM

## 2016-09-12 DIAGNOSIS — Y999 Unspecified external cause status: Secondary | ICD-10-CM

## 2016-09-12 DIAGNOSIS — R2689 Other abnormalities of gait and mobility: Secondary | ICD-10-CM | POA: Diagnosis present

## 2016-09-12 DIAGNOSIS — S0093XA Contusion of unspecified part of head, initial encounter: Secondary | ICD-10-CM | POA: Diagnosis present

## 2016-09-12 DIAGNOSIS — E039 Hypothyroidism, unspecified: Secondary | ICD-10-CM

## 2016-09-12 DIAGNOSIS — Z7189 Other specified counseling: Secondary | ICD-10-CM

## 2016-09-12 DIAGNOSIS — I2510 Atherosclerotic heart disease of native coronary artery without angina pectoris: Secondary | ICD-10-CM | POA: Insufficient documentation

## 2016-09-12 DIAGNOSIS — Z951 Presence of aortocoronary bypass graft: Secondary | ICD-10-CM

## 2016-09-12 DIAGNOSIS — E860 Dehydration: Principal | ICD-10-CM | POA: Diagnosis present

## 2016-09-12 DIAGNOSIS — I10 Essential (primary) hypertension: Secondary | ICD-10-CM

## 2016-09-12 DIAGNOSIS — Y939 Activity, unspecified: Secondary | ICD-10-CM

## 2016-09-12 DIAGNOSIS — Z888 Allergy status to other drugs, medicaments and biological substances status: Secondary | ICD-10-CM

## 2016-09-12 DIAGNOSIS — Y929 Unspecified place or not applicable: Secondary | ICD-10-CM

## 2016-09-12 DIAGNOSIS — F0280 Dementia in other diseases classified elsewhere without behavioral disturbance: Secondary | ICD-10-CM | POA: Diagnosis present

## 2016-09-12 DIAGNOSIS — Y92238 Other place in hospital as the place of occurrence of the external cause: Secondary | ICD-10-CM | POA: Diagnosis not present

## 2016-09-12 DIAGNOSIS — W010XXA Fall on same level from slipping, tripping and stumbling without subsequent striking against object, initial encounter: Secondary | ICD-10-CM | POA: Insufficient documentation

## 2016-09-12 DIAGNOSIS — R2681 Unsteadiness on feet: Secondary | ICD-10-CM

## 2016-09-12 DIAGNOSIS — F419 Anxiety disorder, unspecified: Secondary | ICD-10-CM | POA: Diagnosis present

## 2016-09-12 DIAGNOSIS — Z515 Encounter for palliative care: Secondary | ICD-10-CM | POA: Diagnosis present

## 2016-09-12 DIAGNOSIS — Z66 Do not resuscitate: Secondary | ICD-10-CM | POA: Diagnosis present

## 2016-09-12 DIAGNOSIS — S80211A Abrasion, right knee, initial encounter: Secondary | ICD-10-CM

## 2016-09-12 DIAGNOSIS — Z6820 Body mass index (BMI) 20.0-20.9, adult: Secondary | ICD-10-CM

## 2016-09-12 DIAGNOSIS — W06XXXA Fall from bed, initial encounter: Secondary | ICD-10-CM | POA: Diagnosis not present

## 2016-09-12 DIAGNOSIS — R296 Repeated falls: Secondary | ICD-10-CM

## 2016-09-12 DIAGNOSIS — G2581 Restless legs syndrome: Secondary | ICD-10-CM | POA: Diagnosis present

## 2016-09-12 DIAGNOSIS — F329 Major depressive disorder, single episode, unspecified: Secondary | ICD-10-CM | POA: Diagnosis present

## 2016-09-12 HISTORY — DX: Hypothyroidism, unspecified: E03.9

## 2016-09-12 LAB — CBC WITH DIFFERENTIAL/PLATELET
BASOS ABS: 0 10*3/uL (ref 0–0.1)
Basophils Relative: 1 %
Eosinophils Absolute: 0.1 10*3/uL (ref 0–0.7)
Eosinophils Relative: 1 %
HCT: 38.9 % — ABNORMAL LOW (ref 40.0–52.0)
HEMOGLOBIN: 13.5 g/dL (ref 13.0–18.0)
LYMPHS PCT: 26 %
Lymphs Abs: 1.7 10*3/uL (ref 1.0–3.6)
MCH: 32.2 pg (ref 26.0–34.0)
MCHC: 34.8 g/dL (ref 32.0–36.0)
MCV: 92.6 fL (ref 80.0–100.0)
MONO ABS: 0.5 10*3/uL (ref 0.2–1.0)
Monocytes Relative: 7 %
NEUTROS PCT: 65 %
Neutro Abs: 4.2 10*3/uL (ref 1.4–6.5)
Platelets: 132 10*3/uL — ABNORMAL LOW (ref 150–440)
RBC: 4.2 MIL/uL — ABNORMAL LOW (ref 4.40–5.90)
RDW: 13.7 % (ref 11.5–14.5)
WBC: 6.5 10*3/uL (ref 3.8–10.6)

## 2016-09-12 LAB — URINALYSIS, COMPLETE (UACMP) WITH MICROSCOPIC
Bacteria, UA: NONE SEEN
Bilirubin Urine: NEGATIVE
GLUCOSE, UA: NEGATIVE mg/dL
Ketones, ur: NEGATIVE mg/dL
Leukocytes, UA: NEGATIVE
Nitrite: NEGATIVE
PROTEIN: NEGATIVE mg/dL
SPECIFIC GRAVITY, URINE: 1.009 (ref 1.005–1.030)
Squamous Epithelial / HPF: NONE SEEN
pH: 8 (ref 5.0–8.0)

## 2016-09-12 LAB — BASIC METABOLIC PANEL
ANION GAP: 7 (ref 5–15)
BUN: 34 mg/dL — ABNORMAL HIGH (ref 6–20)
CALCIUM: 9.1 mg/dL (ref 8.9–10.3)
CHLORIDE: 106 mmol/L (ref 101–111)
CO2: 28 mmol/L (ref 22–32)
Creatinine, Ser: 0.98 mg/dL (ref 0.61–1.24)
GFR calc non Af Amer: >60 mL/min (ref >60)
GLUCOSE: 95 mg/dL (ref 65–99)
Potassium: 3.9 mmol/L (ref 3.5–5.1)
Sodium: 141 mmol/L (ref 135–145)

## 2016-09-12 LAB — TROPONIN I: Troponin I: <0.03 ng/mL (ref <0.03)

## 2016-09-12 NOTE — Discharge Instructions (Signed)
Return to the ER for worsening symptoms, persistent vomiting, difficulty breathing or other concerns. °

## 2016-09-12 NOTE — ED Notes (Signed)
Pt was unwitnessed fall. Reported pt feel off a couch. Pt states he is unsure of what happened. Pt is alert to place and person but unsure of the date. Pt has a hematoma to posterior head. No bleeding or lacerations noted. Red abrasion area noted to right knee.

## 2016-09-12 NOTE — ED Notes (Signed)
Called the [facility] [address], they stated they would have someone here to pick pt up in about 30 minutes

## 2016-09-12 NOTE — ED Notes (Signed)
Dr. Zenda AlpersWebster at the bedside to evaluate pt

## 2016-09-12 NOTE — ED Notes (Signed)
Pt dressed by this RN- gray socks, gray plaid pj pants, yellow plaid shirt, and brown bedroom shoes.

## 2016-09-12 NOTE — ED Triage Notes (Addendum)
Pt arrived to ED via EMS from the [facility] after he fell from standing and hit his head on the floor. Pt seen earlier today in this ED for the same. Recent hx of multiple falls. Pt is said to have a very unsteady gait. c/o tightness in his neck and headache. c-collar in place. Abrasion noted to right side of forehead. Pt alert and calm. Answering questions without difficulty.

## 2016-09-12 NOTE — ED Notes (Signed)
Pt given chocolate milk 

## 2016-09-12 NOTE — ED Notes (Addendum)
Pt to CT

## 2016-09-12 NOTE — ED Notes (Signed)
EDT Judeth CornfieldStephanie and Alissa in to in and out cath patient and pt had been incontinent  of urine on the bed unable to obtain any urine from patients bladder.  Pt cleansed and dried. Pt asking for chocolate milk and ok to give per MD.Will attempt to re cath patient in a little bit.

## 2016-09-12 NOTE — ED Triage Notes (Addendum)
Patient bought in by ems from the [facility] for fall. Patient states that he fell asleep on the sofa and fell off. Patient denies pain at this time. Patient with hematoma to posterior head.

## 2016-09-12 NOTE — ED Notes (Signed)
Assisted nurse with in and out cath. Of pt. Assisted nurse in changing pt's soiled urine diaper and linen.

## 2016-09-12 NOTE — ED Notes (Signed)
[facility] came and picked pt up, wheeled him to car. Pt alert and talking in complete sentences upon DC. Able to stand from stretcher to wheelchair with some assistance.

## 2016-09-12 NOTE — ED Notes (Signed)
Eber JonesCarolyn, daughter, called this Rn. Was yelling at this Rn over the phone stating she needed to know what was going on. Family member informed that this RN can't give info over the phone d/t HIPPA. Informed this Rn really couldn't talk because this Rn was in another pt's room. Family member gave this RN a number that is not correct and not reachable when this Rn tried to call family member back 10 minutes later.

## 2016-09-12 NOTE — ED Provider Notes (Signed)
[facility] Emergency Department Provider Note   ____________________________________________   First MD Initiated Contact with Patient 09/12/16 [phone]     (approximate)  I have reviewed the triage vital signs and the nursing notes.   HISTORY  Chief Complaint Fall  Limited by dementia  HPI Anthony Blankenship is a 83 y.o. male brought to the ED from skilled nursing facility with a chief complaint of unwitnessed fall. Patient reports he slipped off of a couch but is unsure of what exactly happened. Voices no medical complaints. Denies pain. Rest of history is limited by patient's dementia.   Past Medical History:  Diagnosis Date  . Anxiety   . Coronary artery disease   . Depression   . Hypertension   . Hypothyroidism   . Kidney stones   . Parkinson's disease (HCC)   . Restless leg syndrome     Patient Active Problem List   Diagnosis Date Noted  . Coronary artery disease involving native coronary artery of native heart with angina pectoris (HCC) 09/08/2016  . Near syncope 06/12/2016  . Lower GI bleed 04/25/2016  . GI bleed 04/25/2016  . Restlessness 05/14/2015    Past Surgical History:  Procedure Laterality Date  . APPENDECTOMY    . CARDIAC CATHETERIZATION    . CORONARY ARTERY BYPASS GRAFT      Prior to Admission medications   Medication Sig Start Date End Date Taking? Authorizing Provider  acetaminophen (TYLENOL) 325 MG tablet Take 650 mg by mouth every 6 (six) hours as needed for mild pain.     Historical Provider, MD  carbidopa-levodopa-entacapone (STALEVO) 50-200-200 MG tablet Take 1 tablet by mouth 4 (four) times daily.    Historical Provider, MD  levothyroxine (SYNTHROID, LEVOTHROID) 50 MCG tablet Take 50 mcg by mouth daily.     Historical Provider, MD  loperamide (IMODIUM A-D) 2 MG tablet Take 2 mg by mouth as needed for diarrhea or loose stools. Do not exceed 8 doses in 24 hours    Historical Provider, MD  meloxicam (MOBIC) 7.5 MG  tablet Take 7.5 mg by mouth 2 (two) times daily as needed.    Historical Provider, MD  rOPINIRole (REQUIP) 1 MG tablet Take 1 tablet (1 mg total) by mouth 3 (three) times daily. 05/15/15   Enedina FinnerSona Patel, MD  senna-docusate (SENOKOT-S) 8.6-50 MG tablet Take 1 tablet by mouth at bedtime as needed for mild constipation. 04/25/16   Enedina FinnerSona Patel, MD  Vitamin D, Ergocalciferol, (DRISDOL) 50000 units CAPS capsule Take 50,000 Units by mouth every 7 (seven) days.    Historical Provider, MD    Allergies Other  Family History  Problem Relation Age of Onset  . Liver disease Father   . Heart disease Mother   . Kidney disease Neg Hx   . Prostate cancer Neg Hx   . Bladder Cancer Neg Hx     Social History Social History  Substance Use Topics  . Smoking status: Never Smoker  . Smokeless tobacco: Never Used  . Alcohol use No    Review of Systems  Constitutional: Positive for fall. No fever/chills. Eyes: No visual changes. ENT: No sore throat. Cardiovascular: Denies chest pain. Respiratory: Denies shortness of breath. Gastrointestinal: No abdominal pain.  No nausea, no vomiting.  No diarrhea.  No constipation. Genitourinary: Negative for dysuria. Musculoskeletal: Negative for back pain. Skin: Negative for rash. Neurological: Negative for headaches, focal weakness or numbness.  10-point ROS otherwise negative.  ____________________________________________   PHYSICAL EXAM:  VITAL SIGNS: ED  Triage Vitals  Enc Vitals Group     BP 09/12/16 0455 (!) 183/95     Pulse Rate 09/12/16 0455 60     Resp 09/12/16 0455 18     Temp 09/12/16 0454 97.5 F (36.4 C)     Temp Source 09/12/16 0454 Oral     SpO2 09/12/16 0455 100 %     Weight 09/12/16 0455 140 lb (63.5 kg)     Height 09/12/16 0455 5\' 9"  (1.753 m)     Head Circumference --      Peak Flow --      Pain Score --      Pain Loc --      Pain Edu? --      Excl. in GC? --     Constitutional: Alert and oriented. Frail appearing and in no  acute distress. Eyes: Conjunctivae are normal. PERRL. EOMI. Head: Small posterior scalp hematoma without laceration or bleeding. Nose: No congestion/rhinnorhea. Mouth/Throat: Mucous membranes are moist.  Oropharynx non-erythematous. Neck: No stridor.  No cervical spine tenderness to palpation. Cardiovascular: Normal rate, regular rhythm. Grossly normal heart sounds.  Good peripheral circulation. Respiratory: Normal respiratory effort.  No retractions. Lungs CTAB. Gastrointestinal: Soft and nontender. No distention. No abdominal bruits. No CVA tenderness. Musculoskeletal: Pelvis stable. No lower extremity tenderness nor edema.  Small abrasion to right knee. Full range of motion without pain. No joint effusions. Neurologic:  Essential tremors. Alert and oriented to person and place. Normal speech and language. No gross focal neurologic deficits are appreciated. MAEWx4. Skin:  Skin is warm, dry and intact. No rash noted. Psychiatric: Mood and affect are normal. Speech and behavior are normal.  ____________________________________________   LABS (all labs ordered are listed, but only abnormal results are displayed)  Labs Reviewed  CBC WITH DIFFERENTIAL/PLATELET - Abnormal; Notable for the following:       Result Value   RBC 4.20 (*)    HCT 38.9 (*)    Platelets 132 (*)    All other components within normal limits  BASIC METABOLIC PANEL - Abnormal; Notable for the following:    BUN 34 (*)    All other components within normal limits  TROPONIN I   ____________________________________________  EKG  ED ECG REPORT I, Edwen Mclester J, the attending physician, personally viewed and interpreted this ECG.   Date: 09/12/2016  EKG Time: 0503  Rate: 61  Rhythm: normal EKG, normal sinus rhythm  Axis: Normal  Intervals:right bundle branch block  ST&T Change: Nonspecific  ____________________________________________  RADIOLOGY  CT head interpreted per Dr. Chase Picket: Normal head CT for  age. ____________________________________________   PROCEDURES  Procedure(s) performed: None  Procedures  Critical Care performed: No  ____________________________________________   INITIAL IMPRESSION / ASSESSMENT AND PLAN / ED COURSE  Pertinent labs & imaging results that were available during my care of the patient were reviewed by me and considered in my medical decision making (see chart for details).  83 year old male brought to the ED for unwitnessed fall. Found to have small posterior scalp hematoma. CT head and stated for intracranial hemorrhage. Basic lab work unremarkable; awaiting urinalysis.  Clinical Course as of Sep 13 710  Mon Sep 12, 2016  0710 Patient's diaper was saturated when nursing attempted in and out catheter; patient given chocolate milk to drink and will reattempt urinalysis. Care transferred to Dr. Sharma Covert pending urine results. Anticipate discharge back to nursing facility.  [JS]    Clinical Course User Index [JS] Irean Hong, MD  ____________________________________________   FINAL CLINICAL IMPRESSION(S) / ED DIAGNOSES  Final diagnoses:  Fall, initial encounter  Scalp hematoma, initial encounter      NEW MEDICATIONS STARTED DURING THIS VISIT:  New Prescriptions   No medications on file     Note:  This document was prepared using Dragon voice recognition software and may include unintentional dictation errors.    Irean Hong, MD 09/12/16 [phone]

## 2016-09-13 ENCOUNTER — Emergency Department: Payer: Medicare Other

## 2016-09-13 DIAGNOSIS — S0081XA Abrasion of other part of head, initial encounter: Secondary | ICD-10-CM | POA: Diagnosis not present

## 2016-09-13 DIAGNOSIS — R2681 Unsteadiness on feet: Secondary | ICD-10-CM | POA: Diagnosis present

## 2016-09-13 DIAGNOSIS — I2510 Atherosclerotic heart disease of native coronary artery without angina pectoris: Secondary | ICD-10-CM | POA: Diagnosis present

## 2016-09-13 DIAGNOSIS — R296 Repeated falls: Secondary | ICD-10-CM | POA: Diagnosis present

## 2016-09-13 DIAGNOSIS — Z888 Allergy status to other drugs, medicaments and biological substances status: Secondary | ICD-10-CM | POA: Diagnosis not present

## 2016-09-13 DIAGNOSIS — E860 Dehydration: Secondary | ICD-10-CM | POA: Diagnosis present

## 2016-09-13 DIAGNOSIS — E039 Hypothyroidism, unspecified: Secondary | ICD-10-CM | POA: Diagnosis present

## 2016-09-13 DIAGNOSIS — Z6820 Body mass index (BMI) 20.0-20.9, adult: Secondary | ICD-10-CM | POA: Diagnosis not present

## 2016-09-13 DIAGNOSIS — F0280 Dementia in other diseases classified elsewhere without behavioral disturbance: Secondary | ICD-10-CM | POA: Diagnosis present

## 2016-09-13 DIAGNOSIS — Y92129 Unspecified place in nursing home as the place of occurrence of the external cause: Secondary | ICD-10-CM | POA: Diagnosis not present

## 2016-09-13 DIAGNOSIS — F419 Anxiety disorder, unspecified: Secondary | ICD-10-CM | POA: Diagnosis present

## 2016-09-13 DIAGNOSIS — Z7189 Other specified counseling: Secondary | ICD-10-CM | POA: Diagnosis not present

## 2016-09-13 DIAGNOSIS — G2581 Restless legs syndrome: Secondary | ICD-10-CM | POA: Diagnosis present

## 2016-09-13 DIAGNOSIS — S0093XA Contusion of unspecified part of head, initial encounter: Secondary | ICD-10-CM | POA: Diagnosis present

## 2016-09-13 DIAGNOSIS — E43 Unspecified severe protein-calorie malnutrition: Secondary | ICD-10-CM | POA: Diagnosis present

## 2016-09-13 DIAGNOSIS — R2689 Other abnormalities of gait and mobility: Secondary | ICD-10-CM | POA: Diagnosis present

## 2016-09-13 DIAGNOSIS — Y92238 Other place in hospital as the place of occurrence of the external cause: Secondary | ICD-10-CM | POA: Diagnosis not present

## 2016-09-13 DIAGNOSIS — W06XXXA Fall from bed, initial encounter: Secondary | ICD-10-CM | POA: Diagnosis not present

## 2016-09-13 DIAGNOSIS — F329 Major depressive disorder, single episode, unspecified: Secondary | ICD-10-CM | POA: Diagnosis present

## 2016-09-13 DIAGNOSIS — G20 Parkinson's disease: Secondary | ICD-10-CM | POA: Diagnosis present

## 2016-09-13 DIAGNOSIS — Z515 Encounter for palliative care: Secondary | ICD-10-CM | POA: Diagnosis present

## 2016-09-13 DIAGNOSIS — Z79899 Other long term (current) drug therapy: Secondary | ICD-10-CM | POA: Diagnosis not present

## 2016-09-13 DIAGNOSIS — W19XXXA Unspecified fall, initial encounter: Secondary | ICD-10-CM | POA: Diagnosis not present

## 2016-09-13 DIAGNOSIS — Z8679 Personal history of other diseases of the circulatory system: Secondary | ICD-10-CM | POA: Diagnosis not present

## 2016-09-13 DIAGNOSIS — Z951 Presence of aortocoronary bypass graft: Secondary | ICD-10-CM | POA: Diagnosis not present

## 2016-09-13 DIAGNOSIS — S0003XA Contusion of scalp, initial encounter: Secondary | ICD-10-CM | POA: Diagnosis present

## 2016-09-13 DIAGNOSIS — Z66 Do not resuscitate: Secondary | ICD-10-CM | POA: Diagnosis present

## 2016-09-13 LAB — CBC
HCT: 41.5 % (ref 40.0–52.0)
Hemoglobin: 14.4 g/dL (ref 13.0–18.0)
MCH: 32.4 pg (ref 26.0–34.0)
MCHC: 34.6 g/dL (ref 32.0–36.0)
MCV: 93.6 fL (ref 80.0–100.0)
Platelets: 124 10*3/uL — ABNORMAL LOW (ref 150–440)
RBC: 4.44 MIL/uL (ref 4.40–5.90)
RDW: 13.7 % (ref 11.5–14.5)
WBC: 7 10*3/uL (ref 3.8–10.6)

## 2016-09-13 LAB — BASIC METABOLIC PANEL
ANION GAP: 6 (ref 5–15)
BUN: 30 mg/dL — ABNORMAL HIGH (ref 6–20)
CHLORIDE: 107 mmol/L (ref 101–111)
CO2: 30 mmol/L (ref 22–32)
Calcium: 9.2 mg/dL (ref 8.9–10.3)
Creatinine, Ser: 0.74 mg/dL (ref 0.61–1.24)
GFR calc Af Amer: >60 mL/min (ref >60)
GFR calc non Af Amer: >60 mL/min (ref >60)
GLUCOSE: 90 mg/dL (ref 65–99)
POTASSIUM: 4.1 mmol/L (ref 3.5–5.1)
Sodium: 143 mmol/L (ref 135–145)

## 2016-09-13 MED ORDER — DIAZEPAM 2 MG PO TABS: 2.0000 mg | ORAL_TABLET | Freq: Two times a day (BID) | ORAL | Status: DC | PRN

## 2016-09-13 MED ORDER — ONDANSETRON HCL 4 MG/2ML IJ SOLN
4.0000 mg | Freq: Four times a day (QID) | INTRAMUSCULAR | Status: DC | PRN
Start: 2016-09-13 — End: 2016-09-16

## 2016-09-13 MED ORDER — SODIUM CHLORIDE 0.9 % IV BOLUS (SEPSIS)
500.0000 mL | Freq: Once | INTRAVENOUS | Status: AC
  Administered 2016-09-13: 500 mL via INTRAVENOUS

## 2016-09-13 MED ORDER — ACETAMINOPHEN 325 MG PO TABS: 650.0000 mg | ORAL_TABLET | Freq: Four times a day (QID) | ORAL | Status: DC | PRN

## 2016-09-13 MED ORDER — LEVOTHYROXINE SODIUM 50 MCG PO TABS
50.0000 ug | ORAL_TABLET | Freq: Every day | ORAL | Status: DC
  Administered 2016-09-15 – 2016-09-16 (×2): 50 ug via ORAL
  Filled 2016-09-13 (×2): qty 1

## 2016-09-13 MED ORDER — ONDANSETRON HCL 4 MG PO TABS: 4.0000 mg | ORAL_TABLET | Freq: Four times a day (QID) | ORAL | Status: DC | PRN

## 2016-09-13 MED ORDER — ACETAMINOPHEN 650 MG RE SUPP: 650.0000 mg | Freq: Four times a day (QID) | RECTAL | Status: DC | PRN

## 2016-09-13 MED ORDER — CARBIDOPA-LEVODOPA-ENTACAPONE 50-200-200 MG PO TABS: 1.0000 | ORAL_TABLET | Freq: Four times a day (QID) | ORAL | Status: DC

## 2016-09-13 MED ORDER — CARBIDOPA-LEVODOPA 25-100 MG PO TABS
2.0000 | ORAL_TABLET | Freq: Four times a day (QID) | ORAL | Status: DC
  Administered 2016-09-13 – 2016-09-16 (×12): 2 via ORAL
  Filled 2016-09-13 (×12): qty 2

## 2016-09-13 MED ORDER — ENTACAPONE 200 MG PO TABS
200.0000 mg | ORAL_TABLET | Freq: Four times a day (QID) | ORAL | Status: DC
  Administered 2016-09-13 – 2016-09-16 (×12): 200 mg via ORAL
  Filled 2016-09-13 (×15): qty 1

## 2016-09-13 MED ORDER — SODIUM CHLORIDE 0.9 % IV SOLN
INTRAVENOUS | Status: DC
  Administered 2016-09-13 – 2016-09-15 (×6): via INTRAVENOUS

## 2016-09-13 MED ORDER — ENOXAPARIN SODIUM 40 MG/0.4ML ~~LOC~~ SOLN
40.0000 mg | SUBCUTANEOUS | Status: DC
  Administered 2016-09-13 – 2016-09-15 (×3): 40 mg via SUBCUTANEOUS
  Filled 2016-09-13 (×3): qty 0.4

## 2016-09-13 MED ORDER — SENNOSIDES-DOCUSATE SODIUM 8.6-50 MG PO TABS: 1.0000 | ORAL_TABLET | Freq: Every evening | ORAL | Status: DC | PRN

## 2016-09-13 NOTE — ED Notes (Addendum)
Pt assisted to standing per MD request. Pt tolerated well; unsteady and full assistance required. Pt denies any back pain or hip pain upon standing. No distress noted. Pt given chocolate milk and c-collar removed.

## 2016-09-13 NOTE — ED Notes (Signed)
Pt with unwitnessed fall. Pt attempted to ambulate/walk without assistance and heard falling to floor with a "thud". Butch, Charge RN and Erie NoeVanessa, RN immediately to bedside, followed by Sue LushAndrea, RN; Mayra, EDT; and Dr Theone StanleyWebtser. Pt found on all fours in room, small abrasion noted to forehead above right eye that was not present upon pt's arrival to ED. Pt assisted back into bed; new orders received for Head CT.

## 2016-09-13 NOTE — Progress Notes (Signed)
Initial Nutrition Assessment  DOCUMENTATION CODES:   Severe malnutrition in context of chronic illness  INTERVENTION:  1. Continue Chocolate Milk 2. Magic cup TID with meals, each supplement provides 290 kcal and 9 grams of protein  NUTRITION DIAGNOSIS:   Malnutrition related to chronic illness as evidenced by severe depletion of muscle mass, severe depletion of body fat.  GOAL:   Patient will meet greater than or equal to 90% of their needs  MONITOR:   PO intake, I & O's, Labs, Weight trends, Supplement acceptance  REASON FOR ASSESSMENT:   Malnutrition Screening Tool    ASSESSMENT:   Anthony Blankenship  is a 83 y.o. male with a known history of Parkinson's disease, restless leg syndrome, coronary artery disease, depression, hypertension, hypo thyroidism, nephrolithiasis was referred from [facility] facility secondary to fall.  Unable to communicate with patient - advanced parkinson's Per chart, 3 falls in past 24 hours, unable to care for self. No documented meal completion yet. Nutrition-Focused physical exam completed. Findings are severe fat depletion, severe muscle depletion, and no edema.  Will monitor for chewing/swallowing concerns and PO intake. Labs and medications reviewed: NS @ 6975mL/hr   Diet Order:  DIET DYS 2 Room service appropriate? No; Fluid consistency: Thin  Skin:  Reviewed, no issues  Last BM:  PTA  Height:   Ht Readings from Last 1 Encounters:  09/12/16 5\' 9"  (1.753 m)    Weight:   Wt Readings from Last 1 Encounters:  09/12/16 140 lb (63.5 kg)    Ideal Body Weight:  72.72 kg  BMI:  Body mass index is 20.67 kg/m.  Estimated Nutritional Needs:   Kcal:  1600-1900 calories  Protein:  63-76 gm  Fluid:  >/= 1.6L  EDUCATION NEEDS:   No education needs identified at this time  Dionne AnoWilliam M. Jah Alarid, MS, RD LDN Inpatient Clinical Dietitian Pager [phone]

## 2016-09-13 NOTE — Care Management (Signed)
Patient placed in observation after presenting from [facility] assisted living.  Have placed call to the facility to determine patient's baseline functioning but there was no answer to the call after being placed on hold for an extended amount of time.  It is reported that patient at baseline may be almost wheelchair bound but he has had several falls in the last few days at the facility.  CSW aware

## 2016-09-13 NOTE — ED Notes (Signed)
Pt found on the floor of exam room by charge nurse after hearing noise inside room. Bed in the lowest position and side rails of bed remain in upright position. pt yellow socks located under covers on stretcher. Pt asked why he got up and he states he does not know. Abrasion noted just above right eyebrow. Dr. Zenda AlpersWebster at the bedside.

## 2016-09-13 NOTE — ED Provider Notes (Signed)
[facility] Provider Note   ____________________________________________   First MD Initiated Contact with Patient 09/12/16 2339     (approximate)  I have reviewed the triage vital signs and the nursing notes.   HISTORY  Chief Complaint Fall    HPI Anthony Blankenship is a 83 y.o. male who comes into the hospital today after a fall. The patient states that he must up and going to the bathroom but he fell out of bed. He denies any loss of consciousness. The patient was seen earlier this morning at about 5 AM for a fall as well. The patient has a history of Parkinson's disease that he is typically very unsteady on his feet. He denies any chest pain but states that he has pain on the left side of his neck. The patient rates his pain a 4 out of 10 in intensity. The patient has a bruise as well on his left forehead. He was sent in for evaluation after this fall.   Past Medical History:  Diagnosis Date  . Anxiety   . Coronary artery disease   . Depression   . Hypertension   . Hypothyroidism   . Kidney stones   . Parkinson's disease (HCC)   . Restless leg syndrome     Patient Active Problem List   Diagnosis Date Noted  . Coronary artery disease involving native coronary artery of native heart with angina pectoris (HCC) 09/08/2016  . Near syncope 06/12/2016  . Lower GI bleed 04/25/2016  . GI bleed 04/25/2016  . Restlessness 05/14/2015    Past Surgical History:  Procedure Laterality Date  . APPENDECTOMY    . CARDIAC CATHETERIZATION    . CORONARY ARTERY BYPASS GRAFT      Prior to Admission medications   Medication Sig Start Date End Date Taking? Authorizing Provider  acetaminophen (TYLENOL) 325 MG tablet Take 650 mg by mouth every 6 (six) hours as needed for mild pain.     Historical Provider, MD  carbidopa-levodopa-entacapone (STALEVO) 50-200-200 MG tablet Take 1 tablet by mouth 4 (four) times daily.    Historical Provider, MD    diazepam (VALIUM) 2 MG tablet Take 2 mg by mouth every 12 (twelve) hours as needed for anxiety.    Historical Provider, MD  levothyroxine (SYNTHROID, LEVOTHROID) 50 MCG tablet Take 50 mcg by mouth daily.     Historical Provider, MD  loperamide (IMODIUM A-D) 2 MG tablet Take 2 mg by mouth as needed for diarrhea or loose stools. Do not exceed 8 doses in 24 hours    Historical Provider, MD  meloxicam (MOBIC) 7.5 MG tablet Take 7.5 mg by mouth 2 (two) times daily as needed.    Historical Provider, MD  rOPINIRole (REQUIP) 1 MG tablet Take 1 tablet (1 mg total) by mouth 3 (three) times daily. 05/15/15   Enedina Finner, MD  senna-docusate (SENOKOT-S) 8.6-50 MG tablet Take 1 tablet by mouth at bedtime as needed for mild constipation. 04/25/16   Enedina Finner, MD  traMADol (ULTRAM) 50 MG tablet Take by mouth 2 (two) times daily.    Historical Provider, MD    Allergies Other  Family History  Problem Relation Age of Onset  . Liver disease Father   . Heart disease Mother   . Kidney disease Neg Hx   . Prostate cancer Neg Hx   . Bladder Cancer Neg Hx     Social History Social History  Substance Use Topics  . Smoking status: Never Smoker  .  Smokeless tobacco: Never Used  . Alcohol use No    Review of Systems Constitutional: No fever/chills Eyes: No visual changes. ENT: No sore throat. Cardiovascular: Denies chest pain. Respiratory: Denies shortness of breath. Gastrointestinal: No abdominal pain.  No nausea, no vomiting.  No diarrhea.  No constipation. Genitourinary: Negative for dysuria. Musculoskeletal: neck pain. Skin: Abrasion to left forehead Neurological: Negative for headaches, focal weakness or numbness.  10-point ROS otherwise negative.  ____________________________________________   PHYSICAL EXAM:  VITAL SIGNS: ED Triage Vitals  Enc Vitals Group     BP 09/12/16 2313 (!) 143/88     Pulse Rate 09/12/16 2313 72     Resp 09/12/16 2313 18     Temp 09/12/16 2313 98.1 F (36.7 C)      Temp Source 09/12/16 2313 Oral     SpO2 09/12/16 2313 99 %     Weight 09/12/16 2314 140 lb (63.5 kg)     Height 09/12/16 2314 5\' 9"  (1.753 m)     Head Circumference --      Peak Flow --      Pain Score 09/12/16 2314 4     Pain Loc --      Pain Edu? --      Excl. in GC? --     Constitutional: Alert and oriented. Well appearing and in no acute distress. Eyes: Conjunctivae are normal. PERRL. EOMI. Head: Abrasion to left forehead Nose: No congestion/rhinnorhea. Mouth/Throat: Mucous membranes are moist.  Oropharynx non-erythematous. Neck: No cervical spine tenderness to palpation. Cardiovascular: Normal rate, regular rhythm. Grossly normal heart sounds.  Good peripheral circulation. Respiratory: Normal respiratory effort.  No retractions. Lungs CTAB. Gastrointestinal: Soft and nontender. No distention. Positive bowel sounds. Musculoskeletal: No lower extremity tenderness nor edema.  No hip tenderness to palpation, no shoulder tenderness, no upper extremity or lower extremity deformities. Neurologic:  some mumbling and difficult to understand speech, normal language Skin:  Skin is warm, dry, abrasion to the left forehead Psychiatric: Mood and affect are normal.   ____________________________________________   LABS (all labs ordered are listed, but only abnormal results are displayed)  Labs Reviewed - No data to display ____________________________________________  EKG  none ____________________________________________  RADIOLOGY  CT head and cervical spine ____________________________________________   PROCEDURES  Procedure(s) performed: None  Procedures  Critical Care performed: No  ____________________________________________   INITIAL IMPRESSION / ASSESSMENT AND PLAN / ED COURSE  Pertinent labs & imaging results that were available during my care of the patient were reviewed by me and considered in my medical decision making (see chart for  details).  This is an 83 year old male who comes into the hospital today after a fall. The patient has Parkinson's disease so does have frequent falls and was seen approximately 16 hours ago in the emergency department for a fall. At that time the patient had an EKG as well as some blood work. His BUN was mildly elevated but otherwise his blood work was normal. The patient did ask for some chocolate milk when I did evaluate him. I did look at the patient's CT scans and they're unremarkable. I will have the nurse stand the patient to ensure that he has no other pains and if he is able to stand without significant discomfort he will be discharged back to his nursing home.  Clinical Course as of Sep 13 37  Tue Sep 13, 2016  0011 1. No acute intracranial hemorrhage. Age-related atrophy and chronic microvascular ischemic changes. 2. No acute/traumatic cervical spine pathology. Degenerative  changes. 3. Bilateral carotid bulb calcified plaques.   CT Head Wo Contrast [AW]    Clinical Course User Index [AW] Rebecka ApleyAllison P Jacki Couse, MD   The patient was able to stand without any difficulty. He will be discharged back to his nursing home.  ____________________________________________   FINAL CLINICAL IMPRESSION(S) / ED DIAGNOSES  Final diagnoses:  Abrasion  Contusion of head, unspecified part of head, initial encounter  Fall, initial encounter      NEW MEDICATIONS STARTED DURING THIS VISIT:  New Prescriptions   No medications on file     Note:  This document was prepared using Dragon voice recognition software and may include unintentional dictation errors.    Rebecka ApleyAllison P Denika Krone, MD 09/13/16 [phone]

## 2016-09-13 NOTE — Evaluation (Signed)
Physical Therapy Evaluation Patient Details Name: Anthony Blankenship MRN: [id number] DOB: Jan 17, 1925 Today's Date: 09/13/2016   History of Present Illness  83 y.o. male with a known history of Parkinson's disease, restless leg syndrome, coronary artery disease, depression, hypertension, hypo thyroidism, nephrolithiasis was referred from [facility] facility secondary to fall.  Pt has had multiple recent falls and was here yesterday with the same.  Clinical Impression  Pt initially was very sleepy and unable to open eyes, however with some encouragement he was able to interact, very softly speak and do a little mobility and activity. Unsure as to how much pt is able to do on his own at the assisted living facility - but he reports he transfers to his w/c on his own and with some help is able to get around relatively well.  Pt is extremely functionally limited and though he was able to stand at the EOB with b/l HHA he was not functionally able to do much on his own.  Per PLOF pt would benefit from STR to return to a more functional and independent level, however it appears that given his recent increase in falls a higher level of overall care may be necessary - difficult to fully assess this date.     Follow Up Recommendations SNF    Equipment Recommendations       Recommendations for Other Services       Precautions / Restrictions Precautions Precautions: Fall Restrictions Weight Bearing Restrictions: No      Mobility  Bed Mobility Overal bed mobility: Needs Assistance Bed Mobility: Supine to Sit;Sit to Supine     Supine to sit: Min assist;Mod assist Sit to supine: Max assist   General bed mobility comments: Pt able to use rails to assist himself to EOB, did need assist.  Unable to get himself back into bed, needed heavy assist.   Transfers Overall transfer level: Needs assistance Equipment used: 1 person hand held assist (b/l UEs) Transfers: Sit to/from Stand Sit to  Stand: Mod assist         General transfer comment: Pt actually able to assist getting to standing, was reliant on PT's HHA and back of legs on bed but maintained standing with only min assist for ~90 seconds  Ambulation/Gait             General Gait Details: deferred, unsafe  Stairs            Wheelchair Mobility    Modified Rankin (Stroke Patients Only)       Balance Overall balance assessment: Needs assistance Sitting-balance support: Bilateral upper extremity supported Sitting balance-Leahy Scale: Fair     Standing balance support: Bilateral upper extremity supported Standing balance-Leahy Scale: Poor                               Pertinent Vitals/Pain Pain Assessment: No/denies pain    Home Living Family/patient expects to be discharged to:: Assisted living               Home Equipment: Wheelchair - power (pt reports he does very little "walking" just transfers)      Prior Function Level of Independence: Needs assistance         Comments: Previously pt reports he was regularly walking with walker, now reports being essentially w/c bound.     Hand Dominance        Extremity/Trunk Assessment   Upper Extremity Assessment  Upper Extremity Assessment: Generalized weakness (limited in all planes, especially elevation, tremors)    Lower Extremity Assessment Lower Extremity Assessment: Generalized weakness (inconsistently followed cues for testing, limited)       Communication   Communication:  (very weak voice, inconsistent response time)  Cognition Arousal/Alertness: Lethargic Behavior During Therapy: Restless (tremor in b/l UEs, able to occasionally open eyes, ) Overall Cognitive Status: Difficult to assess                 General Comments: Pt able to interact, but lethargic, and generally limited with short responses    General Comments      Exercises     Assessment/Plan    PT Assessment Patient needs  continued PT services  PT Problem List Decreased strength;Decreased activity tolerance;Decreased balance;Decreased safety awareness;Decreased mobility;Decreased coordination;Decreased cognition;Decreased knowledge of use of DME       PT Treatment Interventions DME instruction;Functional mobility training;Therapeutic activities;Therapeutic exercise;Balance training;Neuromuscular re-education;Cognitive remediation;Patient/family education;Wheelchair mobility training    PT Goals (Current goals can be found in the Care Plan section)  Acute Rehab PT Goals Patient Stated Goal: none stated PT Goal Formulation: Patient unable to participate in goal setting Time For Goal Achievement: 09/27/16 Potential to Achieve Goals: Fair    Frequency Min 2X/week   Barriers to discharge        Co-evaluation               End of Session Equipment Utilized During Treatment: Gait belt Activity Tolerance: Patient limited by lethargy;Patient limited by fatigue Patient left: with bed alarm set;with call bell/phone within reach;with nursing/sitter in room Nurse Communication: Mobility status PT Visit Diagnosis: Muscle weakness (generalized) (M62.81);Difficulty in walking, not elsewhere classified (R26.2)    Functional Assessment Tool Used: AM-PAC 6 Clicks Basic Mobility Functional Limitation: Mobility: Walking and moving around Mobility: Walking and Moving Around Current Status (N6295): At least 60 percent but less than 80 percent impaired, limited or restricted Mobility: Walking and Moving Around Goal Status [phone]): At least 20 percent but less than 40 percent impaired, limited or restricted    Time: 1119-1141 PT Time Calculation (min) (ACUTE ONLY): 22 min   Charges:   PT Evaluation $PT Eval Low Complexity: 1 Procedure     PT G Codes:   PT G-Codes **NOT FOR INPATIENT CLASS** Functional Assessment Tool Used: AM-PAC 6 Clicks Basic Mobility Functional Limitation: Mobility: Walking and moving  around Mobility: Walking and Moving Around Current Status (K4401): At least 60 percent but less than 80 percent impaired, limited or restricted Mobility: Walking and Moving Around Goal Status [phone]): At least 20 percent but less than 40 percent impaired, limited or restricted     Malachi Pro, DPT 09/13/2016, 3:43 PM

## 2016-09-13 NOTE — H&P (Signed)
[facility] [place] [facility]   PATIENT NAME: Anthony Blankenship    MR#:  [id number]  DATE OF BIRTH:  [birth year]  DATE OF ADMISSION:  09/12/2016  PRIMARY CARE PHYSICIAN: Pcp Not In System   REQUESTING/REFERRING PHYSICIAN:   CHIEF COMPLAINT:   Chief Complaint  Patient presents with  . Fall    HISTORY OF PRESENT ILLNESS: Anthony Reapdward Shafran  is a 83 y.o. male with a known history of Parkinson's disease, restless leg syndrome, coronary artery disease, depression, hypertension, hypo thyroidism, nephrolithiasis was referred from [facility] secondary to fall. Patient was seen 1 day ago in the emergency room for fall. She was bagged in this facility and again fell down. He was evaluated in the emergency room and he had a fall in our emergency room even with the bed rails up. Patient is an abrasion over the forehead as well as over the right eyebrow. CT scan of the head showed no acute intracranial abnormality. Patient has advanced Parkinson's disease and not oriented to time place and person. He opens eyes to loud verbal commands. Unable to give any history. Patient had multiple falls in the last 1-2 weeks and has gait instability.  PAST MEDICAL HISTORY:   Past Medical History:  Diagnosis Date  . Anxiety   . Coronary artery disease   . Depression   . Hypertension   . Hypothyroidism   . Kidney stones   . Parkinson's disease (HCC)   . Restless leg syndrome     PAST SURGICAL HISTORY: Past Surgical History:  Procedure Laterality Date  . APPENDECTOMY    . CARDIAC CATHETERIZATION    . CORONARY ARTERY BYPASS GRAFT      SOCIAL HISTORY:  Social History  Substance Use Topics  . Smoking status: Never Smoker  . Smokeless tobacco: Never Used  . Alcohol use No    FAMILY HISTORY:  Family History  Problem Relation Age of Onset  . Liver disease Father   . Heart disease Mother   . Kidney disease Neg Hx   . Prostate cancer Neg Hx   . Bladder Cancer  Neg Hx     DRUG ALLERGIES:  Allergies  Allergen Reactions  . Other Other (See Comments)    Uncoded Allergy. Allergen: Sleeping Medications    REVIEW OF SYSTEMS:  Could not be obtained secondary to advanced parkinsons disease and dementia  MEDICATIONS AT HOME:  Prior to Admission medications   Medication Sig Start Date End Date Taking? Authorizing Provider  carbidopa-levodopa-entacapone (STALEVO) 50-200-200 MG tablet Take 1 tablet by mouth 4 (four) times daily.   Yes Historical Provider, MD  diazepam (VALIUM) 2 MG tablet Take 2 mg by mouth every 12 (twelve) hours as needed for anxiety.   Yes Historical Provider, MD  levothyroxine (SYNTHROID, LEVOTHROID) 50 MCG tablet Take 50 mcg by mouth daily.    Yes Historical Provider, MD  acetaminophen (TYLENOL) 325 MG tablet Take 650 mg by mouth every 6 (six) hours as needed for mild pain.     Historical Provider, MD  loperamide (IMODIUM A-D) 2 MG tablet Take 2 mg by mouth as needed for diarrhea or loose stools. Do not exceed 8 doses in 24 hours    Historical Provider, MD      PHYSICAL EXAMINATION:   VITAL SIGNS: Blood pressure (!) 158/67, pulse 69, temperature 98.1 F (36.7 C), temperature source Oral, resp. rate 18, height 5\' 9"  (1.753 m), weight 63.5 kg (140 lb), SpO2 99 %.  GENERAL:  83 y.o.-year-old patient lying in the bed not oriented to time, place and person.  EYES: Pupils equal, round, reactive to light and accommodation. No scleral icterus. Extraocular muscles intact.  HEENT: Abrasion over fore head and right eye brow, normocephalic. Oropharynx and nasopharynx clear.  NECK:  Supple, no jugular venous distention. No thyroid enlargement, no tenderness.  LUNGS: Normal breath sounds bilaterally, no wheezing, rales,rhonchi or crepitation. No use of accessory muscles of respiration.  CARDIOVASCULAR: S1, S2 normal. No murmurs, rubs, or gallops.  ABDOMEN: Soft, nontender, nondistended. Bowel sounds present. No organomegaly or mass.   EXTREMITIES: No pedal edema, cyanosis, or clubbing.  NEUROLOGIC: Moves all extremities, cranial nerves II to XII appear to be intact,  Gait impaired PSYCHIATRIC: could not be assessed  SKIN: No obvious rash, lesion, or ulcer.   LABORATORY PANEL:   CBC  Recent Labs Lab 09/12/16 0524  WBC 6.5  HGB 13.5  HCT 38.9*  PLT 132*  MCV 92.6  MCH 32.2  MCHC 34.8  RDW 13.7  LYMPHSABS 1.7  MONOABS 0.5  EOSABS 0.1  BASOSABS 0.0   ------------------------------------------------------------------------------------------------------------------  Chemistries   Recent Labs Lab 09/12/16 0524  NA 141  K 3.9  CL 106  CO2 28  GLUCOSE 95  BUN 34*  CREATININE 0.98  CALCIUM 9.1   ------------------------------------------------------------------------------------------------------------------ estimated creatinine clearance is 45.9 mL/min (by C-G formula based on SCr of 0.98 mg/dL). ------------------------------------------------------------------------------------------------------------------ No results for input(s): TSH, T4TOTAL, T3FREE, THYROIDAB in the last 72 hours.  Invalid input(s): FREET3   Coagulation profile No results for input(s): INR, PROTIME in the last 168 hours. ------------------------------------------------------------------------------------------------------------------- No results for input(s): DDIMER in the last 72 hours. -------------------------------------------------------------------------------------------------------------------  Cardiac Enzymes  Recent Labs Lab 09/12/16 0524  TROPONINI <0.03   ------------------------------------------------------------------------------------------------------------------ Invalid input(s): POCBNP  ---------------------------------------------------------------------------------------------------------------  Urinalysis    Component Value Date/Time   COLORURINE YELLOW (A) 09/12/2016 0957   APPEARANCEUR  CLEAR (A) 09/12/2016 0957   APPEARANCEUR Clear 10/18/2014 0603   LABSPEC 1.009 09/12/2016 0957   LABSPEC 1.032 10/18/2014 0603   PHURINE 8.0 09/12/2016 0957   GLUCOSEU NEGATIVE 09/12/2016 0957   GLUCOSEU Negative 10/18/2014 0603   HGBUR SMALL (A) 09/12/2016 0957   BILIRUBINUR NEGATIVE 09/12/2016 0957   BILIRUBINUR Negative 10/18/2014 0603   KETONESUR NEGATIVE 09/12/2016 0957   PROTEINUR NEGATIVE 09/12/2016 0957   NITRITE NEGATIVE 09/12/2016 0957   LEUKOCYTESUR NEGATIVE 09/12/2016 0957   LEUKOCYTESUR Trace 10/18/2014 0603     RADIOLOGY: Ct Head Wo Contrast  Result Date: 09/13/2016 CLINICAL DATA:  83 year old male with fall and head contusion. EXAM: CT HEAD WITHOUT CONTRAST TECHNIQUE: Contiguous axial images were obtained from the base of the skull through the vertex without intravenous contrast. COMPARISON:  Head CT dated 09/12/2016 FINDINGS: Brain: There is moderate age-related atrophy and chronic microvascular ischemic changes. There is no acute intracranial hemorrhage. No mass effect or midline shift noted. Vascular: No hyperdense vessel or unexpected calcification. Skull: Normal. Negative for fracture or focal lesion. Sinuses/Orbits: Mild mucoperiosteal thickening of paranasal sinuses. No air-fluid levels. The mastoid air cells are clear. Other: None IMPRESSION: 1. No acute intracranial hemorrhage. 2. Age-related atrophy and chronic microvascular ischemic changes. Electronically Signed   By: Elgie Collard M.D.   On: 09/13/2016 01:42   Ct Head Wo Contrast  Result Date: 09/12/2016 CLINICAL DATA:  83 year old male with fall. EXAM: CT HEAD WITHOUT CONTRAST CT CERVICAL SPINE WITHOUT CONTRAST TECHNIQUE: Multidetector CT imaging of the head and cervical spine was performed following the standard protocol without intravenous contrast. Multiplanar CT image  reconstructions of the cervical spine were also generated. COMPARISON:  Head CT dated 09/12/2017 FINDINGS: CT HEAD FINDINGS Brain: There is  moderate age-related atrophy and chronic microvascular ischemic changes. There is no acute intracranial hemorrhage. No mass effect or midline shift noted. No intra-axial fluid collection. Vascular: No hyperdense vessel or unexpected calcification. Skull: Normal. Negative for fracture or focal lesion. Sinuses/Orbits: Mild mucoperiosteal thickening of paranasal sinuses. No air-fluid levels. The mastoid air cells are clear. Other: None CT CERVICAL SPINE FINDINGS Alignment: No acute subluxation. Skull base and vertebrae: No acute fracture. No primary bone lesion or focal pathologic process. Osteopenia. Soft tissues and spinal canal: No prevertebral fluid or swelling. No visible canal hematoma. Disc levels: Multilevel disc disease most prominent at C5-C6 and C6-C7 where there is disc space narrowing and bone spurring. Probable mild associated narrowing of the neural foramina at these levels. Upper chest: Negative. Other: Bilateral carotid bulb atherosclerotic plaque. IMPRESSION: 1. No acute intracranial hemorrhage. Age-related atrophy and chronic microvascular ischemic changes. 2. No acute/traumatic cervical spine pathology. Degenerative changes. 3. Bilateral carotid bulb calcified plaques. Electronically Signed   By: Elgie Collard M.D.   On: 09/12/2016 23:57   Ct Head Wo Contrast  Result Date: 09/12/2016 CLINICAL DATA:  Fall EXAM: CT HEAD WITHOUT CONTRAST TECHNIQUE: Contiguous axial images were obtained from the base of the skull through the vertex without intravenous contrast. COMPARISON:  Head CT 05/03/2016 FINDINGS: Brain: No mass lesion, intraparenchymal hemorrhage or extra-axial collection. No evidence of acute cortical infarct. Brain parenchyma and CSF-containing spaces are normal for age. Vascular: No hyperdense vessel or unexpected calcification. Skull: Normal visualized skull base, calvarium and extracranial soft tissues. Sinuses/Orbits: No sinus fluid levels or advanced mucosal thickening. No mastoid  effusion. Normal orbits. IMPRESSION: Normal head CT for age. Electronically Signed   By: Deatra Robinson M.D.   On: 09/12/2016 05:52   Ct Cervical Spine Wo Contrast  Result Date: 09/12/2016 CLINICAL DATA:  83 year old male with fall. EXAM: CT HEAD WITHOUT CONTRAST CT CERVICAL SPINE WITHOUT CONTRAST TECHNIQUE: Multidetector CT imaging of the head and cervical spine was performed following the standard protocol without intravenous contrast. Multiplanar CT image reconstructions of the cervical spine were also generated. COMPARISON:  Head CT dated 09/12/2017 FINDINGS: CT HEAD FINDINGS Brain: There is moderate age-related atrophy and chronic microvascular ischemic changes. There is no acute intracranial hemorrhage. No mass effect or midline shift noted. No intra-axial fluid collection. Vascular: No hyperdense vessel or unexpected calcification. Skull: Normal. Negative for fracture or focal lesion. Sinuses/Orbits: Mild mucoperiosteal thickening of paranasal sinuses. No air-fluid levels. The mastoid air cells are clear. Other: None CT CERVICAL SPINE FINDINGS Alignment: No acute subluxation. Skull base and vertebrae: No acute fracture. No primary bone lesion or focal pathologic process. Osteopenia. Soft tissues and spinal canal: No prevertebral fluid or swelling. No visible canal hematoma. Disc levels: Multilevel disc disease most prominent at C5-C6 and C6-C7 where there is disc space narrowing and bone spurring. Probable mild associated narrowing of the neural foramina at these levels. Upper chest: Negative. Other: Bilateral carotid bulb atherosclerotic plaque. IMPRESSION: 1. No acute intracranial hemorrhage. Age-related atrophy and chronic microvascular ischemic changes. 2. No acute/traumatic cervical spine pathology. Degenerative changes. 3. Bilateral carotid bulb calcified plaques. Electronically Signed   By: Elgie Collard M.D.   On: 09/12/2016 23:57    EKG: Orders placed or performed during the hospital  encounter of 09/12/16  . EKG 12-Lead  . EKG 12-Lead  . EKG 12-Lead  . EKG 12-Lead  IMPRESSION AND PLAN: 84 year old elderly male patient with Parkinson's disease, restless leg syndrome, coronary artery disease, hypertension, hypothyroidism presented to the emergency room with frequent falls. Admitting diagnosis 1. Gait instability 2. Dehydration 3. Advanced Parkinson's disease 4. Restless leg syndrome 5. Coronary artery disease Treatment plan Admit patient to medical floor observation bed Physical therapy evaluation for gait and balance training IV fluid hydration Case management evaluation whether patient needs any nursing home placement with rehabilitation Resume home medications Supportive care  All the records are reviewed and case discussed with ED provider. Management plans discussed with the patient, family and they are in agreement.  CODE STATUS:DNR Code Status History    Date Active Date Inactive Code Status Order ID Comments User Context   06/12/2016  2:55 AM 06/14/2016  8:30 PM DNR [id number]  Arnaldo Natal, MD Inpatient   04/25/2016  1:08 AM 04/25/2016  6:35 PM DNR [id number]  Tonye Royalty, DO Inpatient   04/25/2016  1:07 AM 04/25/2016  1:08 AM Full Code [id number]  Tonye Royalty, DO Inpatient   05/14/2015  7:05 PM 05/15/2015  4:51 PM DNR [id number]  Altamese Dilling, MD ED    Questions for Most Recent Historical Code Status (Order [id number])    Question Answer Comment   In the event of cardiac or respiratory ARREST Do not call a "code blue"    In the event of cardiac or respiratory ARREST Do not perform Intubation, CPR, defibrillation or ACLS    In the event of cardiac or respiratory ARREST Use medication by any route, position, wound care, and other measures to relive pain and suffering. May use oxygen, suction and manual treatment of airway obstruction as needed for comfort.         Advance Directive Documentation     Most Recent Value  Type  of Advance Directive  Out of facility DNR (pink MOST or yellow form)  Pre-existing out of facility DNR order (yellow form or pink MOST form)  -  "MOST" Form in Place?  -       TOTAL TIME TAKING CARE OF THIS PATIENT: 50 minutes.    Ihor Austin M.D on 09/13/2016 at 5:15 AM  Between 7am to 6pm - Pager - [phone]  After 6pm go to [email or web address] - password [email or web address]  [facility]  Office  [phone]  CC: Primary care physician; Pcp Not In System

## 2016-09-13 NOTE — Progress Notes (Signed)
Patient resting comfortably, vss,Patient confused, not impulsive.

## 2016-09-13 NOTE — Progress Notes (Addendum)
[employer] - [place] at [facility]                                                                                                                                                                                  Patient Demographics   Anthony Blankenship, is a 83 y.o. male, DOB - 19-Dec-1924, [id number]  Admit date - 09/12/2016   Admitting Physician Ihor Austin, MD  Outpatient Primary MD for the patient is Pcp Not In System   LOS - 0  Subjective: Continues to complain of feeling weak and having gait instability, talking very slowly Review of Systems:   CONSTITUTIONAL: No documented fever. Positive fatigue, positive weakness. No weight gain, no weight loss.  EYES: No blurry or double vision.  ENT: No tinnitus. No postnasal drip. No redness of the oropharynx.  RESPIRATORY: No cough, no wheeze, no hemoptysis. No dyspnea.  CARDIOVASCULAR: No chest pain. No orthopnea. No palpitations. No syncope.  GASTROINTESTINAL: No nausea, no vomiting or diarrhea. No abdominal pain. No melena or hematochezia.  GENITOURINARY: No dysuria or hematuria.  ENDOCRINE: No polyuria or nocturia. No heat or cold intolerance.  HEMATOLOGY: No anemia. No bruising. No bleeding.  INTEGUMENTARY: No rashes. No lesions.  MUSCULOSKELETAL: No arthritis. No swelling. No gout. Recurrent falls NEUROLOGIC: No numbness, tingling, or ataxia. No seizure-type activity. Recurrent falls PSYCHIATRIC: No anxiety. No insomnia. No ADD.    Vitals:   Vitals:   09/13/16 0330 09/13/16 0500 09/13/16 0616 09/13/16 1225  BP: (!) 167/132 (!) 160/90 (!) 172/66 121/70  Pulse: 72 66 (!) 51 (!) 50  Resp: 18 18 16 18   Temp:   98.4 F (36.9 C) 98.4 F (36.9 C)  TempSrc:   Oral Oral  SpO2: 100% 100% 99% 98%  Weight:      Height:        Wt Readings from Last 3 Encounters:  09/12/16 63.5 kg (140 lb)  09/12/16 63.5 kg (140 lb)  09/07/16 64 kg (141 lb)     Intake/Output Summary (Last 24 hours) at 09/13/16 1820 Last  data filed at 09/13/16 1601  Gross per 24 hour  Intake            457.5 ml  Output                0 ml  Net            457.5 ml    Physical Exam:   GENERAL: Pleasant-appearing in no apparent distress. Cachetic HEAD, EYES, EARS, NOSE AND THROAT: Atraumatic, normocephalic. Extraocular muscles are intact. Pupils equal and reactive to light. Sclerae anicteric. No conjunctival injection. No oro-pharyngeal erythema.  NECK: Supple. There is no jugular  venous distention. No bruits, no lymphadenopathy, no thyromegaly.  HEART: Regular rate and rhythm,. No murmurs, no rubs, no clicks.  LUNGS: Clear to auscultation bilaterally. No rales or rhonchi. No wheezes.  ABDOMEN: Soft, flat, nontender, nondistended. Has good bowel sounds. No hepatosplenomegaly appreciated.  EXTREMITIES: No evidence of any cyanosis, clubbing, or peripheral edema.  +2 pedal and radial pulses bilaterally.  NEUROLOGIC: The patient is alert, awake, and oriented x2 with no focal motor or sensory deficits appreciated bilaterally.  SKIN: Moist and warm with no rashes appreciated.  Psych: Not anxious, depressed LN: No inguinal LN enlargement    Antibiotics   Anti-infectives    None      Medications   Scheduled Meds: . entacapone  200 mg Oral QID   And  . carbidopa-levodopa  2 tablet Oral QID  . enoxaparin (LOVENOX) injection  40 mg Subcutaneous Q24H  . levothyroxine  50 mcg Oral QAC breakfast   Continuous Infusions: . sodium chloride 75 mL/hr at 09/13/16 1102   PRN Meds:.acetaminophen **OR** acetaminophen, diazepam, ondansetron **OR** ondansetron (ZOFRAN) IV, senna-docusate   Data Review:   Micro Results No results found for this or any previous visit (from the past 240 hour(s)).  Radiology Reports Ct Head Wo Contrast  Result Date: 09/13/2016 CLINICAL DATA:  83 year old male with fall and head contusion. EXAM: CT HEAD WITHOUT CONTRAST TECHNIQUE: Contiguous axial images were obtained from the base of the skull  through the vertex without intravenous contrast. COMPARISON:  Head CT dated 09/12/2016 FINDINGS: Brain: There is moderate age-related atrophy and chronic microvascular ischemic changes. There is no acute intracranial hemorrhage. No mass effect or midline shift noted. Vascular: No hyperdense vessel or unexpected calcification. Skull: Normal. Negative for fracture or focal lesion. Sinuses/Orbits: Mild mucoperiosteal thickening of paranasal sinuses. No air-fluid levels. The mastoid air cells are clear. Other: None IMPRESSION: 1. No acute intracranial hemorrhage. 2. Age-related atrophy and chronic microvascular ischemic changes. Electronically Signed   By: Elgie Collard M.D.   On: 09/13/2016 01:42   Ct Head Wo Contrast  Result Date: 09/12/2016 CLINICAL DATA:  83 year old male with fall. EXAM: CT HEAD WITHOUT CONTRAST CT CERVICAL SPINE WITHOUT CONTRAST TECHNIQUE: Multidetector CT imaging of the head and cervical spine was performed following the standard protocol without intravenous contrast. Multiplanar CT image reconstructions of the cervical spine were also generated. COMPARISON:  Head CT dated 09/12/2017 FINDINGS: CT HEAD FINDINGS Brain: There is moderate age-related atrophy and chronic microvascular ischemic changes. There is no acute intracranial hemorrhage. No mass effect or midline shift noted. No intra-axial fluid collection. Vascular: No hyperdense vessel or unexpected calcification. Skull: Normal. Negative for fracture or focal lesion. Sinuses/Orbits: Mild mucoperiosteal thickening of paranasal sinuses. No air-fluid levels. The mastoid air cells are clear. Other: None CT CERVICAL SPINE FINDINGS Alignment: No acute subluxation. Skull base and vertebrae: No acute fracture. No primary bone lesion or focal pathologic process. Osteopenia. Soft tissues and spinal canal: No prevertebral fluid or swelling. No visible canal hematoma. Disc levels: Multilevel disc disease most prominent at C5-C6 and C6-C7 where  there is disc space narrowing and bone spurring. Probable mild associated narrowing of the neural foramina at these levels. Upper chest: Negative. Other: Bilateral carotid bulb atherosclerotic plaque. IMPRESSION: 1. No acute intracranial hemorrhage. Age-related atrophy and chronic microvascular ischemic changes. 2. No acute/traumatic cervical spine pathology. Degenerative changes. 3. Bilateral carotid bulb calcified plaques. Electronically Signed   By: Elgie Collard M.D.   On: 09/12/2016 23:57   Ct Head Wo Contrast  Result Date:  09/12/2016 CLINICAL DATA:  Fall EXAM: CT HEAD WITHOUT CONTRAST TECHNIQUE: Contiguous axial images were obtained from the base of the skull through the vertex without intravenous contrast. COMPARISON:  Head CT 05/03/2016 FINDINGS: Brain: No mass lesion, intraparenchymal hemorrhage or extra-axial collection. No evidence of acute cortical infarct. Brain parenchyma and CSF-containing spaces are normal for age. Vascular: No hyperdense vessel or unexpected calcification. Skull: Normal visualized skull base, calvarium and extracranial soft tissues. Sinuses/Orbits: No sinus fluid levels or advanced mucosal thickening. No mastoid effusion. Normal orbits. IMPRESSION: Normal head CT for age. Electronically Signed   By: Deatra Robinson M.D.   On: 09/12/2016 05:52   Ct Cervical Spine Wo Contrast  Result Date: 09/12/2016 CLINICAL DATA:  83 year old male with fall. EXAM: CT HEAD WITHOUT CONTRAST CT CERVICAL SPINE WITHOUT CONTRAST TECHNIQUE: Multidetector CT imaging of the head and cervical spine was performed following the standard protocol without intravenous contrast. Multiplanar CT image reconstructions of the cervical spine were also generated. COMPARISON:  Head CT dated 09/12/2017 FINDINGS: CT HEAD FINDINGS Brain: There is moderate age-related atrophy and chronic microvascular ischemic changes. There is no acute intracranial hemorrhage. No mass effect or midline shift noted. No intra-axial  fluid collection. Vascular: No hyperdense vessel or unexpected calcification. Skull: Normal. Negative for fracture or focal lesion. Sinuses/Orbits: Mild mucoperiosteal thickening of paranasal sinuses. No air-fluid levels. The mastoid air cells are clear. Other: None CT CERVICAL SPINE FINDINGS Alignment: No acute subluxation. Skull base and vertebrae: No acute fracture. No primary bone lesion or focal pathologic process. Osteopenia. Soft tissues and spinal canal: No prevertebral fluid or swelling. No visible canal hematoma. Disc levels: Multilevel disc disease most prominent at C5-C6 and C6-C7 where there is disc space narrowing and bone spurring. Probable mild associated narrowing of the neural foramina at these levels. Upper chest: Negative. Other: Bilateral carotid bulb atherosclerotic plaque. IMPRESSION: 1. No acute intracranial hemorrhage. Age-related atrophy and chronic microvascular ischemic changes. 2. No acute/traumatic cervical spine pathology. Degenerative changes. 3. Bilateral carotid bulb calcified plaques. Electronically Signed   By: Elgie Collard M.D.   On: 09/12/2016 23:57     CBC  Recent Labs Lab 09/12/16 0524 09/13/16 0530  WBC 6.5 7.0  HGB 13.5 14.4  HCT 38.9* 41.5  PLT 132* 124*  MCV 92.6 93.6  MCH 32.2 32.4  MCHC 34.8 34.6  RDW 13.7 13.7  LYMPHSABS 1.7  --   MONOABS 0.5  --   EOSABS 0.1  --   BASOSABS 0.0  --     Chemistries   Recent Labs Lab 09/12/16 0524 09/13/16 0530  NA 141 143  K 3.9 4.1  CL 106 107  CO2 28 30  GLUCOSE 95 90  BUN 34* 30*  CREATININE 0.98 0.74  CALCIUM 9.1 9.2   Cardiac Enzymes  Recent Labs Lab 09/12/16 0524  TROPONINI <0.03   ------------------------------------------------------------------------------------------------------------------ Invalid input(s): POCBNP    Assessment & Plan  83 year old elderly male patient with Parkinson's disease, restless leg syndrome, coronary artery disease, hypertension, hypothyroidism  presented to the emergency room with frequent falls.  * Gait instability & recurrent falls: likely multifactorial, PT, OT eval - likely needs STR/SNF  * Dehydration: continue IVFs  * Advanced Parkinson's disease: continue sinemet, palliative care c/s  * Restless leg syndrome  * Coronary artery disease  * Hypothyroidism: TSH is normal Continue Synthroid  * severe protein calorie malnutrition:    Consider Hospice or Palliative care at the facility  Time spent:     Code Status Orders  Start     Ordered   06/12/16 0256  Do not attempt resuscitation (DNR)  Continuous    Question Answer Comment  In the event of cardiac or respiratory ARREST Do not call a "code blue"   In the event of cardiac or respiratory ARREST Do not perform Intubation, CPR, defibrillation or ACLS   In the event of cardiac or respiratory ARREST Use medication by any route, position, wound care, and other measures to relive pain and suffering. May use oxygen, suction and manual treatment of airway obstruction as needed for comfort.      06/12/16 0255    Code Status History    Date Active Date Inactive Code Status Order ID Comments User Context   04/25/2016  1:08 AM 04/25/2016  6:35 PM DNR [id number]  Tonye RoyaltyAlexis Hugelmeyer, DO Inpatient   04/25/2016  1:07 AM 04/25/2016  1:08 AM Full Code [id number]  Tonye RoyaltyAlexis Hugelmeyer, DO Inpatient   05/14/2015  7:05 PM 05/15/2015  4:51 PM DNR [id number]  Altamese DillingVaibhavkumar Vachhani, MD ED       Consults none   DVT Prophylaxis  Lovenox -  Lab Results  Component Value Date   PLT 124 (L) 09/13/2016      Time Spent in minutes  32min  Greater than 50% of time spent in care coordination and counseling patient regarding the condition and plan of care.   Delfino LovettVipul Lorri Fukuhara M.D on 09/13/2016 at 6:20 PM  Between 7am to 6pm - Pager - [phone]  After 6pm go to [email or web address] - password [email or web address] [facility]  [facility]   Office  [phone]

## 2016-09-13 NOTE — ED Provider Notes (Addendum)
The patient was discharged to home and as were waiting for transport had another fall in the emergency department. The patient now has a contusion to the right side of his head. I will repeat the patient's head CT.  CT head:  1. No acute intracranial hemorrhage.  2. Age-related atrophy and chronic microvascular ischemic changes.   The patient will be discharged to home  And EMS arrived they stated [facility] and it was not appropriate care for the patient. The patient is unable to care for himself and is unable to walk for himself. The decision was made to admit the patient to the hospitalist service given his 3 falls in 24 hours as well as his inability to care for himself. I am unsure how far from his baseline he is at this time as he continually gets up to walk in the emergency department without assistance. I will give the patient a 500 mL bolus of normal saline and I will repeat the patient's blood work.   Rebecka ApleyAllison P Avika Carbine, MD 09/13/16 [id number]    Rebecka ApleyAllison P Alanya Vukelich, MD 09/13/16 [phone]

## 2016-09-13 NOTE — ED Notes (Addendum)
PT to CT.

## 2016-09-13 NOTE — Plan of Care (Signed)
Problem: Education: Goal: Knowledge of [facility] Education information/materials will improve Outcome: Not Progressing No receptive to learning.

## 2016-09-14 DIAGNOSIS — Z515 Encounter for palliative care: Secondary | ICD-10-CM

## 2016-09-14 DIAGNOSIS — Z7189 Other specified counseling: Secondary | ICD-10-CM

## 2016-09-14 DIAGNOSIS — G20 Parkinson's disease: Secondary | ICD-10-CM

## 2016-09-14 DIAGNOSIS — R2681 Unsteadiness on feet: Secondary | ICD-10-CM

## 2016-09-14 DIAGNOSIS — R296 Repeated falls: Secondary | ICD-10-CM

## 2016-09-14 LAB — BASIC METABOLIC PANEL
ANION GAP: 5 (ref 5–15)
BUN: 29 mg/dL — ABNORMAL HIGH (ref 6–20)
CALCIUM: 8.6 mg/dL — AB (ref 8.9–10.3)
CO2: 27 mmol/L (ref 22–32)
Chloride: 108 mmol/L (ref 101–111)
Creatinine, Ser: 0.78 mg/dL (ref 0.61–1.24)
Glucose, Bld: 92 mg/dL (ref 65–99)
Potassium: 4.1 mmol/L (ref 3.5–5.1)
Sodium: 140 mmol/L (ref 135–145)

## 2016-09-14 LAB — CBC
HEMATOCRIT: 37.9 % — AB (ref 40.0–52.0)
Hemoglobin: 13.2 g/dL (ref 13.0–18.0)
MCH: 32.6 pg (ref 26.0–34.0)
MCHC: 34.8 g/dL (ref 32.0–36.0)
MCV: 93.5 fL (ref 80.0–100.0)
PLATELETS: 117 10*3/uL — AB (ref 150–440)
RBC: 4.05 MIL/uL — ABNORMAL LOW (ref 4.40–5.90)
RDW: 13.7 % (ref 11.5–14.5)
WBC: 5.5 10*3/uL (ref 3.8–10.6)

## 2016-09-14 LAB — MRSA PCR SCREENING: MRSA by PCR: NEGATIVE

## 2016-09-14 NOTE — Progress Notes (Signed)
Patient is significantly more alert and oriented today.  Able to appropriately respond and answer all questions for nursing.  He is anticipating discharging soon and is eager to hear news of where he will be living.  Remains in NSR and no complaints of pain throughout the day.

## 2016-09-14 NOTE — Consult Note (Signed)
Consultation Note Date: 09/14/2016   Patient Name: Anthony Blankenship  DOB: [birth year]  MRN: [id number]  Age / Sex: 83 y.o., male  PCP: Pcp Not In System Referring Physician: Max Sane, MD  Reason for Consultation: Establishing goals of care  HPI/Patient Profile: 83 y.o. male  with past medical history of parkinson's disease, restless leg syndrome, kidney stones, hypothyroidism, hypertension, depression, coronary artery disease, and anxiety admitted on 09/12/2016 with fall from [facility] assisted living. Patient with gait instability and multiple falls the last 1-2 weeks. In ED, ct head negative for acute abnormalities. IVF initiated for dehydration. Palliative medicine consultation for goals of care.   Clinical Assessment and Goals of Care: I have reviewed medical records, discussed with Dr. Manuella Ghazi and met with patient at bedside to discuss diagnosis, [place], EOL wishes, disposition and options.  Introduced Palliative Medicine as specialized medical care for people living with serious illness. It focuses on providing relief from the symptoms and stress of a serious illness. The goal is to improve quality of life for both the patient and the family.  We discussed a brief life review. He has been married three times. No current spouse. Two adult children. He was a Games developer. Living at [facility] assisted living. On a typical day, he ambulates while pushing the wheelchair. He tells me he has a good appetite, but typically only eats half of each meal. He has parkinsons but tells me "it comes and goes." He also deals with restless leg syndrome and often needs to "get out of bed to walk it off."   Discussed current hospitalization, educated on disease trajectory of parkinson's disease and spoke of my concern with frequent falls/gait instability which can lead to many complications.   Advanced directives,  concepts specific to code status, and artifical feeding and hydration were considered and discussed. When asked if he has a POA he states "I am my own POA." Educated on the importance of discussing EOL wishes with his children if he was unable to make decisions for himself. He is firm on decision for DNR/DNI and no feeding tube to prolong life. He tells me he is almost 17 and has "lived a good life."   Called daughter, Hoyle Sauer, to update her on role of palliative medicine in her father's care. It is difficult to engage her in the conversation because she is moaning with discomfort from restleess leg syndrome. Hoyle Sauer also suffers with other chronic co-morbidites and "does what I can for dad" but "I cannot take full responsibility for him." She tells me her brother lives in [place] and only visits at [facility]. Discussed code status and EOL wishes with daughter. She is aware of her father's wishes for no resuscitation, life support, or feeding tube to prolong life. She understands when the time comes, he wants to die a natural death. Discussed that from hospitalization, physical therapy is recommending rehab. Also spoke of my concern with him needing a higher level of care in the future with increased falls and likely disease  progression of parkinson's.  Palliative Care services outpatient were explained and offered. Daughter agreeable with palliative to follow.    SUMMARY OF RECOMMENDATIONS    DNR/DNI  Patient confirms no feeding tube to prolong life.   Agreeable with rehab with palliative services to follow on discharge.   Spoke with daughter via telephone and she agrees with EOL wishes and plan for rehab with palliative.   PMT will continue to shadow chart and support patient/family through hospitalization.   Code Status/Advance Care Planning:  DNR   Symptom Management:   Per attending  Palliative Prophylaxis:   Aspiration, Delirium Protocol, Frequent Pain Assessment, Oral Care and  Turn Reposition  Psycho-social/Spiritual:   Desire for further Chaplaincy support:no  Additional Recommendations: Caregiving  Support/Resources  Prognosis:   Unable to determine  Discharge Planning: [place] for rehab with Palliative care service follow-up      Primary Diagnoses: Present on Admission: . Gait instability   I have reviewed the medical record, interviewed the patient and family, and examined the patient. The following aspects are pertinent.  Past Medical History:  Diagnosis Date  . Anxiety   . Coronary artery disease   . Depression   . Hypertension   . Hypothyroidism   . Kidney stones   . Parkinson's disease ([place])   . Restless leg syndrome    Social History   Social History  . Marital status: Divorced    Spouse name: N/A  . Number of children: N/A  . Years of education: N/A   Social History Main Topics  . Smoking status: Never Smoker  . Smokeless tobacco: Never Used  . Alcohol use No  . Drug use: No  . Sexual activity: Not Asked   Other Topics Concern  . None   Social History Narrative  . None   Family History  Problem Relation Age of Onset  . Liver disease Father   . Heart disease Mother   . Kidney disease Neg Hx   . Prostate cancer Neg Hx   . Bladder Cancer Neg Hx    Scheduled Meds: . entacapone  200 mg Oral QID   And  . carbidopa-levodopa  2 tablet Oral QID  . enoxaparin (LOVENOX) injection  40 mg Subcutaneous Q24H  . levothyroxine  50 mcg Oral QAC breakfast   Continuous Infusions: . sodium chloride 75 mL/hr at 09/14/16 0048   PRN Meds:.acetaminophen **OR** acetaminophen, diazepam, ondansetron **OR** ondansetron (ZOFRAN) IV, senna-docusate Medications Prior to Admission:  Prior to Admission medications   Medication Sig Start Date End Date Taking? Authorizing Provider  carbidopa-levodopa-entacapone (STALEVO) 50-200-200 MG tablet Take 1 tablet by mouth 4 (four) times daily.   Yes Historical Provider, MD    diazepam (VALIUM) 2 MG tablet Take 2 mg by mouth every 12 (twelve) hours as needed for anxiety.   Yes Historical Provider, MD  levothyroxine (SYNTHROID, LEVOTHROID) 50 MCG tablet Take 50 mcg by mouth daily.    Yes Historical Provider, MD  acetaminophen (TYLENOL) 325 MG tablet Take 650 mg by mouth every 6 (six) hours as needed for mild pain.     Historical Provider, MD  loperamide (IMODIUM A-D) 2 MG tablet Take 2 mg by mouth as needed for diarrhea or loose stools. Do not exceed 8 doses in 24 hours    Historical Provider, MD   Allergies  Allergen Reactions  . Other Other (See Comments)    Uncoded Allergy. Allergen: Sleeping Medications   Review of Systems  Constitutional: Positive for activity change and  fatigue.  Musculoskeletal:       Falls  Neurological: Positive for tremors and weakness.  All other systems reviewed and are negative.  Physical Exam  Constitutional: He is oriented to person, place, and time. He is cooperative.  HENT:  Head: Normocephalic and atraumatic.  Cardiovascular: Regular rhythm.   Pulmonary/Chest: Effort normal. He has decreased breath sounds.  Abdominal: Normal appearance.  Neurological: He is alert and oriented to person, place, and time.  Skin: Skin is warm and dry. Abrasion noted. There is pallor.  Psychiatric: He has a normal mood and affect. His speech is normal and behavior is normal.  Nursing note and vitals reviewed.  Vital Signs: BP 132/72 (BP Location: Right Arm)   Pulse (!) 59   Temp 97.5 F (36.4 C) (Oral)   Resp 18   Ht _0  (1.753 m)   Wt 63.5 kg (140 lb)   SpO2 98%   BMI 20.67 kg/m  Pain Assessment: 0-10   Pain Score: 0-No pain  SpO2: SpO2: 98 % O2 Device:SpO2: 98 % O2 Flow Rate: .   IO: Intake/output summary:   Intake/Output Summary (Last 24 hours) at 09/14/16 1114 Last data filed at 09/14/16 1047  Gross per 24 hour  Intake          1181.25 ml  Output              100 ml  Net          1081.25 ml    LBM: Last BM Date:   (unknown) Baseline Weight: Weight: 63.5 kg (140 lb) Most recent weight: Weight: 63.5 kg (140 lb)     Palliative Assessment/Data: PPS 40%   Flowsheet Rows     Most Recent Value  Intake Tab  Referral Department  Hospitalist  Unit at Time of Referral  Cardiac/Telemetry Unit  Palliative Care Primary Diagnosis  Neurology  Date Notified  09/13/16  Palliative Care Type  New Palliative care  Date first seen by Palliative Care  09/14/16  # of days Palliative referral response time  1 Day(s)  Clinical Assessment  Palliative Performance Scale Score  40%  Psychosocial & Spiritual Assessment  Palliative Care Outcomes  Patient/Family meeting held?  Yes  Who was at the meeting?  patient and spoke with daughter via telephone  Palliative Care Outcomes  Clarified goals of care, Provided psychosocial or spiritual support, ACP counseling assistance     Time In: 1015 Time Out: 1130 Time Total: 10mn Greater than 50%  of this time was spent counseling and coordinating care related to the above assessment and plan.  Signed by:  MIhor Dow FNP-C Palliative Medicine Team  Phone: [phone]: [phone]  Please contact Palliative Medicine Team phone at [phone] questions and concerns.  For individual provider: See AShea Evans

## 2016-09-14 NOTE — Progress Notes (Signed)
[employer] - [place] [facility]                                                                                                                                                                                  Patient Demographics   Anthony Blankenship, is a 83 y.o. male, DOB - 30-May-1925, ZOX:[id number]  Admit date - 09/12/2016   Admitting Physician Ihor Austin, MD  Outpatient Primary MD for the patient is Pcp Not In System   LOS - 1  Subjective: Continues to complain of feeling weak and having gait instability, talking very slowly but more alert today Review of Systems:   CONSTITUTIONAL: No documented fever. Positive fatigue, positive weakness. No weight gain, no weight loss.  EYES: No blurry or double vision.  ENT: No tinnitus. No postnasal drip. No redness of the oropharynx.  RESPIRATORY: No cough, no wheeze, no hemoptysis. No dyspnea.  CARDIOVASCULAR: No chest pain. No orthopnea. No palpitations. No syncope.  GASTROINTESTINAL: No nausea, no vomiting or diarrhea. No abdominal pain. No melena or hematochezia.  GENITOURINARY: No dysuria or hematuria.  ENDOCRINE: No polyuria or nocturia. No heat or cold intolerance.  HEMATOLOGY: No anemia. No bruising. No bleeding.  INTEGUMENTARY: No rashes. No lesions.  MUSCULOSKELETAL: No arthritis. No swelling. No gout. Recurrent falls NEUROLOGIC: No numbness, tingling, or ataxia. No seizure-type activity. Recurrent falls PSYCHIATRIC: No anxiety. No insomnia. No ADD.    Vitals:   Vitals:   09/13/16 1225 09/13/16 1947 09/14/16 0413 09/14/16 1148  BP: 121/70 122/69 132/72 130/68  Pulse: (!) 50 67 (!) 59 68  Resp: 18 18 18 16   Temp: 98.4 F (36.9 C) 98.1 F (36.7 C) 97.5 F (36.4 C) 97.9 F (36.6 C)  TempSrc: Oral Oral Oral Oral  SpO2: 98% 97% 98% 100%  Weight:      Height:        Wt Readings from Last 3 Encounters:  09/12/16 63.5 kg (140 lb)  09/12/16 63.5 kg (140 lb)  09/07/16 64 kg (141 lb)     Intake/Output  Summary (Last 24 hours) at 09/14/16 1740 Last data filed at 09/14/16 1355  Gross per 24 hour  Intake          1631.25 ml  Output              100 ml  Net          1531.25 ml    Physical Exam:   GENERAL: Pleasant-appearing in no apparent distress. Cachetic HEAD, EYES, EARS, NOSE AND THROAT: Atraumatic, normocephalic. Extraocular muscles are intact. Pupils equal and reactive to light. Sclerae anicteric. No conjunctival injection. No oro-pharyngeal erythema.  NECK: Supple. There is no  jugular venous distention. No bruits, no lymphadenopathy, no thyromegaly.  HEART: Regular rate and rhythm,. No murmurs, no rubs, no clicks.  LUNGS: Clear to auscultation bilaterally. No rales or rhonchi. No wheezes.  ABDOMEN: Soft, flat, nontender, nondistended. Has good bowel sounds. No hepatosplenomegaly appreciated.  EXTREMITIES: No evidence of any cyanosis, clubbing, or peripheral edema.  +2 pedal and radial pulses bilaterally.  NEUROLOGIC: The patient is alert, awake, and oriented x2 with no focal motor or sensory deficits appreciated bilaterally.  SKIN: Moist and warm with no rashes appreciated.  Psych: Not anxious, depressed LN: No inguinal LN enlargement    Antibiotics   Anti-infectives    None      Medications   Scheduled Meds: . entacapone  200 mg Oral QID   And  . carbidopa-levodopa  2 tablet Oral QID  . enoxaparin (LOVENOX) injection  40 mg Subcutaneous Q24H  . levothyroxine  50 mcg Oral QAC breakfast   Continuous Infusions: . sodium chloride 75 mL/hr at 09/14/16 1355   PRN Meds:.acetaminophen **OR** acetaminophen, diazepam, ondansetron **OR** ondansetron (ZOFRAN) IV, senna-docusate   Data Review:   Micro Results Recent Results (from the past 240 hour(s))  MRSA PCR Screening     Status: None   Collection Time: 09/14/16  6:10 AM  Result Value Ref Range Status   MRSA by PCR NEGATIVE NEGATIVE Final    Comment:        The GeneXpert MRSA Assay (FDA approved for NASAL  specimens only), is one component of a comprehensive MRSA colonization surveillance program. It is not intended to diagnose MRSA infection nor to guide or monitor treatment for MRSA infections.     Radiology Reports Ct Head Wo Contrast  Result Date: 09/13/2016 CLINICAL DATA:  83 year old male with fall and head contusion. EXAM: CT HEAD WITHOUT CONTRAST TECHNIQUE: Contiguous axial images were obtained from the base of the skull through the vertex without intravenous contrast. COMPARISON:  Head CT dated 09/12/2016 FINDINGS: Brain: There is moderate age-related atrophy and chronic microvascular ischemic changes. There is no acute intracranial hemorrhage. No mass effect or midline shift noted. Vascular: No hyperdense vessel or unexpected calcification. Skull: Normal. Negative for fracture or focal lesion. Sinuses/Orbits: Mild mucoperiosteal thickening of paranasal sinuses. No air-fluid levels. The mastoid air cells are clear. Other: None IMPRESSION: 1. No acute intracranial hemorrhage. 2. Age-related atrophy and chronic microvascular ischemic changes. Electronically Signed   By: Elgie CollardArash  Radparvar M.D.   On: 09/13/2016 01:42   Ct Head Wo Contrast  Result Date: 09/12/2016 CLINICAL DATA:  83 year old male with fall. EXAM: CT HEAD WITHOUT CONTRAST CT CERVICAL SPINE WITHOUT CONTRAST TECHNIQUE: Multidetector CT imaging of the head and cervical spine was performed following the standard protocol without intravenous contrast. Multiplanar CT image reconstructions of the cervical spine were also generated. COMPARISON:  Head CT dated 09/12/2017 FINDINGS: CT HEAD FINDINGS Brain: There is moderate age-related atrophy and chronic microvascular ischemic changes. There is no acute intracranial hemorrhage. No mass effect or midline shift noted. No intra-axial fluid collection. Vascular: No hyperdense vessel or unexpected calcification. Skull: Normal. Negative for fracture or focal lesion. Sinuses/Orbits: Mild  mucoperiosteal thickening of paranasal sinuses. No air-fluid levels. The mastoid air cells are clear. Other: None CT CERVICAL SPINE FINDINGS Alignment: No acute subluxation. Skull base and vertebrae: No acute fracture. No primary bone lesion or focal pathologic process. Osteopenia. Soft tissues and spinal canal: No prevertebral fluid or swelling. No visible canal hematoma. Disc levels: Multilevel disc disease most prominent at C5-C6 and C6-C7 where there  is disc space narrowing and bone spurring. Probable mild associated narrowing of the neural foramina at these levels. Upper chest: Negative. Other: Bilateral carotid bulb atherosclerotic plaque. IMPRESSION: 1. No acute intracranial hemorrhage. Age-related atrophy and chronic microvascular ischemic changes. 2. No acute/traumatic cervical spine pathology. Degenerative changes. 3. Bilateral carotid bulb calcified plaques. Electronically Signed   By: Elgie Collard M.D.   On: 09/12/2016 23:57   Ct Head Wo Contrast  Result Date: 09/12/2016 CLINICAL DATA:  Fall EXAM: CT HEAD WITHOUT CONTRAST TECHNIQUE: Contiguous axial images were obtained from the base of the skull through the vertex without intravenous contrast. COMPARISON:  Head CT 05/03/2016 FINDINGS: Brain: No mass lesion, intraparenchymal hemorrhage or extra-axial collection. No evidence of acute cortical infarct. Brain parenchyma and CSF-containing spaces are normal for age. Vascular: No hyperdense vessel or unexpected calcification. Skull: Normal visualized skull base, calvarium and extracranial soft tissues. Sinuses/Orbits: No sinus fluid levels or advanced mucosal thickening. No mastoid effusion. Normal orbits. IMPRESSION: Normal head CT for age. Electronically Signed   By: Deatra Robinson M.D.   On: 09/12/2016 05:52   Ct Cervical Spine Wo Contrast  Result Date: 09/12/2016 CLINICAL DATA:  83 year old male with fall. EXAM: CT HEAD WITHOUT CONTRAST CT CERVICAL SPINE WITHOUT CONTRAST TECHNIQUE:  Multidetector CT imaging of the head and cervical spine was performed following the standard protocol without intravenous contrast. Multiplanar CT image reconstructions of the cervical spine were also generated. COMPARISON:  Head CT dated 09/12/2017 FINDINGS: CT HEAD FINDINGS Brain: There is moderate age-related atrophy and chronic microvascular ischemic changes. There is no acute intracranial hemorrhage. No mass effect or midline shift noted. No intra-axial fluid collection. Vascular: No hyperdense vessel or unexpected calcification. Skull: Normal. Negative for fracture or focal lesion. Sinuses/Orbits: Mild mucoperiosteal thickening of paranasal sinuses. No air-fluid levels. The mastoid air cells are clear. Other: None CT CERVICAL SPINE FINDINGS Alignment: No acute subluxation. Skull base and vertebrae: No acute fracture. No primary bone lesion or focal pathologic process. Osteopenia. Soft tissues and spinal canal: No prevertebral fluid or swelling. No visible canal hematoma. Disc levels: Multilevel disc disease most prominent at C5-C6 and C6-C7 where there is disc space narrowing and bone spurring. Probable mild associated narrowing of the neural foramina at these levels. Upper chest: Negative. Other: Bilateral carotid bulb atherosclerotic plaque. IMPRESSION: 1. No acute intracranial hemorrhage. Age-related atrophy and chronic microvascular ischemic changes. 2. No acute/traumatic cervical spine pathology. Degenerative changes. 3. Bilateral carotid bulb calcified plaques. Electronically Signed   By: Elgie Collard M.D.   On: 09/12/2016 23:57     CBC  Recent Labs Lab 09/12/16 0524 09/13/16 0530 09/14/16 0314  WBC 6.5 7.0 5.5  HGB 13.5 14.4 13.2  HCT 38.9* 41.5 37.9*  PLT 132* 124* 117*  MCV 92.6 93.6 93.5  MCH 32.2 32.4 32.6  MCHC 34.8 34.6 34.8  RDW 13.7 13.7 13.7  LYMPHSABS 1.7  --   --   MONOABS 0.5  --   --   EOSABS 0.1  --   --   BASOSABS 0.0  --   --     Chemistries   Recent  Labs Lab 09/12/16 0524 09/13/16 0530 09/14/16 0314  NA 141 143 140  K 3.9 4.1 4.1  CL 106 107 108  CO2 28 30 27   GLUCOSE 95 90 92  BUN 34* 30* 29*  CREATININE 0.98 0.74 0.78  CALCIUM 9.1 9.2 8.6*   Cardiac Enzymes  Recent Labs Lab 09/12/16 0524  TROPONINI <0.03   ------------------------------------------------------------------------------------------------------------------ Invalid input(s):  POCBNP    Assessment & Plan  83 year old elderly male patient with Parkinson's disease, restless leg syndrome, coronary artery disease, hypertension, hypothyroidism presented to the emergency room with frequent falls.  * Gait instability & recurrent falls: likely multifactorial, PT, OT eval - recommends STR/SNF  * Dehydration: continue IVFs  * Advanced Parkinson's disease: continue sinemet, palliative care c/s  * Restless leg syndrome  * Coronary artery disease  * Hypothyroidism: TSH is normal. Continue Synthroid  * severe protein calorie malnutrition:    Consider Hospice or Palliative care at the facility  Time spent:     Code Status Orders        Start     Ordered   06/12/16 0256  Do not attempt resuscitation (DNR)  Continuous    Question Answer Comment  In the event of cardiac or respiratory ARREST Do not call a "code blue"   In the event of cardiac or respiratory ARREST Do not perform Intubation, CPR, defibrillation or ACLS   In the event of cardiac or respiratory ARREST Use medication by any route, position, wound care, and other measures to relive pain and suffering. May use oxygen, suction and manual treatment of airway obstruction as needed for comfort.      06/12/16 0255    Code Status History    Date Active Date Inactive Code Status Order ID Comments User Context   04/25/2016  1:08 AM 04/25/2016  6:35 PM DNR [id number]  Tonye Royalty, DO Inpatient   04/25/2016  1:07 AM 04/25/2016  1:08 AM Full Code [id number]  Tonye Royalty, DO Inpatient    05/14/2015  7:05 PM 05/15/2015  4:51 PM DNR [id number]  Altamese Dilling, MD ED       Consults none   DVT Prophylaxis  Lovenox -  Lab Results  Component Value Date   PLT 117 (L) 09/14/2016      Time Spent in minutes  30 min  Greater than 50% of time spent in care coordination and counseling patient regarding the condition and plan of care.   Delfino Lovett M.D on 09/14/2016 at 5:40 PM  Between 7am to 6pm - Pager - [phone]  After 6pm go to [email or web address] - password [email or web address] [facility]  [facility]   Office  [phone]

## 2016-09-15 DIAGNOSIS — G20A1 Parkinson's disease without dyskinesia, without mention of fluctuations: Secondary | ICD-10-CM

## 2016-09-15 DIAGNOSIS — Z7189 Other specified counseling: Secondary | ICD-10-CM

## 2016-09-15 DIAGNOSIS — Z515 Encounter for palliative care: Secondary | ICD-10-CM

## 2016-09-15 DIAGNOSIS — G20 Parkinson's disease: Secondary | ICD-10-CM

## 2016-09-15 LAB — BASIC METABOLIC PANEL
ANION GAP: 5 (ref 5–15)
BUN: 26 mg/dL — AB (ref 6–20)
CHLORIDE: 109 mmol/L (ref 101–111)
CO2: 26 mmol/L (ref 22–32)
Calcium: 8.9 mg/dL (ref 8.9–10.3)
Creatinine, Ser: 0.61 mg/dL (ref 0.61–1.24)
GFR calc Af Amer: >60 mL/min (ref >60)
GFR calc non Af Amer: >60 mL/min (ref >60)
GLUCOSE: 97 mg/dL (ref 65–99)
POTASSIUM: 4.1 mmol/L (ref 3.5–5.1)
Sodium: 140 mmol/L (ref 135–145)

## 2016-09-15 LAB — CBC
HEMATOCRIT: 39.5 % — AB (ref 40.0–52.0)
Hemoglobin: 13.5 g/dL (ref 13.0–18.0)
MCH: 31.7 pg (ref 26.0–34.0)
MCHC: 34.2 g/dL (ref 32.0–36.0)
MCV: 92.7 fL (ref 80.0–100.0)
Platelets: 121 10*3/uL — ABNORMAL LOW (ref 150–440)
RBC: 4.26 MIL/uL — AB (ref 4.40–5.90)
RDW: 13.8 % (ref 11.5–14.5)
WBC: 5.7 10*3/uL (ref 3.8–10.6)

## 2016-09-15 NOTE — Plan of Care (Signed)
Problem: Pain Managment: Goal: General experience of comfort will improve Outcome: Completed/Met Date Met: 09/15/16 Pt with no complaints of pain this shift  Problem: Activity: Goal: Risk for activity intolerance will decrease Outcome: Progressing Pt up to [facility] this shift, tolerated well. With 1 assist.  Problem: Nutrition: Goal: Adequate nutrition will be maintained Outcome: Progressing Pt had a big appetite this shift, ate a whole sandwich tray and drank 2 chocolate milks.  Problem: Bowel/Gastric: Goal: Will not experience complications related to bowel motility Outcome: Progressing Pt had 2 BM's this shift

## 2016-09-15 NOTE — Care Management Important Message (Signed)
Important Message  Patient Details  Name: Anthony Blankenship MRN: [id number] Date of Birth: 1925-04-18   Medicare Important Message Given:  Yes  Initial signed IM printed from Epic and given to patient.     Eber HongGreene, Annesha Delgreco R, RN 09/15/2016, 3:17 PM

## 2016-09-15 NOTE — Progress Notes (Signed)
[employer] -  at [facility]                                                                                                                                                                                  Patient Demographics   Anthony Blankenship, is a 83 y.o. male, DOB - 25-Apr-1925, ZOX:[id number]  Admit date - 09/12/2016   Admitting Physician Ihor AustinPavan Pyreddy, MD  Outpatient Primary MD for the patient is Pcp Not In System   LOS - 2  Subjective: more alert today, slowly improving Review of Systems:   CONSTITUTIONAL: No documented fever. Positive fatigue, positive weakness. No weight gain, no weight loss.  EYES: No blurry or double vision.  ENT: No tinnitus. No postnasal drip. No redness of the oropharynx.  RESPIRATORY: No cough, no wheeze, no hemoptysis. No dyspnea.  CARDIOVASCULAR: No chest pain. No orthopnea. No palpitations. No syncope.  GASTROINTESTINAL: No nausea, no vomiting or diarrhea. No abdominal pain. No melena or hematochezia.  GENITOURINARY: No dysuria or hematuria.  ENDOCRINE: No polyuria or nocturia. No heat or cold intolerance.  HEMATOLOGY: No anemia. No bruising. No bleeding.  INTEGUMENTARY: No rashes. No lesions.  MUSCULOSKELETAL: No arthritis. No swelling. No gout. Recurrent falls NEUROLOGIC: No numbness, tingling, or ataxia. No seizure-type activity. Recurrent falls PSYCHIATRIC: No anxiety. No insomnia. No ADD.    Vitals:   Vitals:   09/14/16 2015 09/15/16 0333 09/15/16 0838 09/15/16 1134  BP: (!) 146/95 (!) 141/79 (!) 158/80 (!) 148/75  Pulse: (!) 56 60 67 66  Resp: 20 18 19 18   Temp: 98.5 F (36.9 C) 97.8 F (36.6 C) 97.5 F (36.4 C) 98.1 F (36.7 C)  TempSrc: Oral Oral Oral Oral  SpO2: 96% 100% 98% 90%  Weight:      Height:        Wt Readings from Last 3 Encounters:  09/12/16 63.5 kg (140 lb)  09/12/16 63.5 kg (140 lb)  09/07/16 64 kg (141 lb)     Intake/Output Summary (Last 24 hours) at 09/15/16 1523 Last data filed at  09/15/16 1514  Gross per 24 hour  Intake          3818.75 ml  Output                0 ml  Net          3818.75 ml    Physical Exam:   GENERAL: Pleasant-appearing in no apparent distress. Cachetic HEAD, EYES, EARS, NOSE AND THROAT: Atraumatic, normocephalic. Extraocular muscles are intact. Pupils equal and reactive to light. Sclerae anicteric. No conjunctival injection. No oro-pharyngeal erythema.  NECK: Supple. There is no jugular venous distention. No bruits, no lymphadenopathy,  no thyromegaly.  HEART: Regular rate and rhythm,. No murmurs, no rubs, no clicks.  LUNGS: Clear to auscultation bilaterally. No rales or rhonchi. No wheezes.  ABDOMEN: Soft, flat, nontender, nondistended. Has good bowel sounds. No hepatosplenomegaly appreciated.  EXTREMITIES: No evidence of any cyanosis, clubbing, or peripheral edema.  +2 pedal and radial pulses bilaterally.  NEUROLOGIC: The patient is alert, awake, and oriented x2 with no focal motor or sensory deficits appreciated bilaterally.  SKIN: Moist and warm with no rashes appreciated.  Psych: Not anxious, depressed LN: No inguinal LN enlargement    Antibiotics   Anti-infectives    None      Medications   Scheduled Meds: . entacapone  200 mg Oral QID   And  . carbidopa-levodopa  2 tablet Oral QID  . enoxaparin (LOVENOX) injection  40 mg Subcutaneous Q24H  . levothyroxine  50 mcg Oral QAC breakfast   Continuous Infusions: . sodium chloride 75 mL/hr at 09/15/16 0320   PRN Meds:.acetaminophen **OR** acetaminophen, diazepam, ondansetron **OR** ondansetron (ZOFRAN) IV, senna-docusate   Data Review:   Micro Results Recent Results (from the past 240 hour(s))  MRSA PCR Screening     Status: None   Collection Time: 09/14/16  6:10 AM  Result Value Ref Range Status   MRSA by PCR NEGATIVE NEGATIVE Final    Comment:        The GeneXpert MRSA Assay (FDA approved for NASAL specimens only), is one component of a comprehensive MRSA  colonization surveillance program. It is not intended to diagnose MRSA infection nor to guide or monitor treatment for MRSA infections.     Radiology Reports Ct Head Wo Contrast  Result Date: 09/13/2016 CLINICAL DATA:  83 year old male with fall and head contusion. EXAM: CT HEAD WITHOUT CONTRAST TECHNIQUE: Contiguous axial images were obtained from the base of the skull through the vertex without intravenous contrast. COMPARISON:  Head CT dated 09/12/2016 FINDINGS: Brain: There is moderate age-related atrophy and chronic microvascular ischemic changes. There is no acute intracranial hemorrhage. No mass effect or midline shift noted. Vascular: No hyperdense vessel or unexpected calcification. Skull: Normal. Negative for fracture or focal lesion. Sinuses/Orbits: Mild mucoperiosteal thickening of paranasal sinuses. No air-fluid levels. The mastoid air cells are clear. Other: None IMPRESSION: 1. No acute intracranial hemorrhage. 2. Age-related atrophy and chronic microvascular ischemic changes. Electronically Signed   By: Elgie Collard M.D.   On: 09/13/2016 01:42   Ct Head Wo Contrast  Result Date: 09/12/2016 CLINICAL DATA:  83 year old male with fall. EXAM: CT HEAD WITHOUT CONTRAST CT CERVICAL SPINE WITHOUT CONTRAST TECHNIQUE: Multidetector CT imaging of the head and cervical spine was performed following the standard protocol without intravenous contrast. Multiplanar CT image reconstructions of the cervical spine were also generated. COMPARISON:  Head CT dated 09/12/2017 FINDINGS: CT HEAD FINDINGS Brain: There is moderate age-related atrophy and chronic microvascular ischemic changes. There is no acute intracranial hemorrhage. No mass effect or midline shift noted. No intra-axial fluid collection. Vascular: No hyperdense vessel or unexpected calcification. Skull: Normal. Negative for fracture or focal lesion. Sinuses/Orbits: Mild mucoperiosteal thickening of paranasal sinuses. No air-fluid levels.  The mastoid air cells are clear. Other: None CT CERVICAL SPINE FINDINGS Alignment: No acute subluxation. Skull base and vertebrae: No acute fracture. No primary bone lesion or focal pathologic process. Osteopenia. Soft tissues and spinal canal: No prevertebral fluid or swelling. No visible canal hematoma. Disc levels: Multilevel disc disease most prominent at C5-C6 and C6-C7 where there is disc space narrowing and bone spurring.  Probable mild associated narrowing of the neural foramina at these levels. Upper chest: Negative. Other: Bilateral carotid bulb atherosclerotic plaque. IMPRESSION: 1. No acute intracranial hemorrhage. Age-related atrophy and chronic microvascular ischemic changes. 2. No acute/traumatic cervical spine pathology. Degenerative changes. 3. Bilateral carotid bulb calcified plaques. Electronically Signed   By: Elgie Collard M.D.   On: 09/12/2016 23:57   Ct Head Wo Contrast  Result Date: 09/12/2016 CLINICAL DATA:  Fall EXAM: CT HEAD WITHOUT CONTRAST TECHNIQUE: Contiguous axial images were obtained from the base of the skull through the vertex without intravenous contrast. COMPARISON:  Head CT 05/03/2016 FINDINGS: Brain: No mass lesion, intraparenchymal hemorrhage or extra-axial collection. No evidence of acute cortical infarct. Brain parenchyma and CSF-containing spaces are normal for age. Vascular: No hyperdense vessel or unexpected calcification. Skull: Normal visualized skull base, calvarium and extracranial soft tissues. Sinuses/Orbits: No sinus fluid levels or advanced mucosal thickening. No mastoid effusion. Normal orbits. IMPRESSION: Normal head CT for age. Electronically Signed   By: Deatra Robinson M.D.   On: 09/12/2016 05:52   Ct Cervical Spine Wo Contrast  Result Date: 09/12/2016 CLINICAL DATA:  83 year old male with fall. EXAM: CT HEAD WITHOUT CONTRAST CT CERVICAL SPINE WITHOUT CONTRAST TECHNIQUE: Multidetector CT imaging of the head and cervical spine was performed following  the standard protocol without intravenous contrast. Multiplanar CT image reconstructions of the cervical spine were also generated. COMPARISON:  Head CT dated 09/12/2017 FINDINGS: CT HEAD FINDINGS Brain: There is moderate age-related atrophy and chronic microvascular ischemic changes. There is no acute intracranial hemorrhage. No mass effect or midline shift noted. No intra-axial fluid collection. Vascular: No hyperdense vessel or unexpected calcification. Skull: Normal. Negative for fracture or focal lesion. Sinuses/Orbits: Mild mucoperiosteal thickening of paranasal sinuses. No air-fluid levels. The mastoid air cells are clear. Other: None CT CERVICAL SPINE FINDINGS Alignment: No acute subluxation. Skull base and vertebrae: No acute fracture. No primary bone lesion or focal pathologic process. Osteopenia. Soft tissues and spinal canal: No prevertebral fluid or swelling. No visible canal hematoma. Disc levels: Multilevel disc disease most prominent at C5-C6 and C6-C7 where there is disc space narrowing and bone spurring. Probable mild associated narrowing of the neural foramina at these levels. Upper chest: Negative. Other: Bilateral carotid bulb atherosclerotic plaque. IMPRESSION: 1. No acute intracranial hemorrhage. Age-related atrophy and chronic microvascular ischemic changes. 2. No acute/traumatic cervical spine pathology. Degenerative changes. 3. Bilateral carotid bulb calcified plaques. Electronically Signed   By: Elgie Collard M.D.   On: 09/12/2016 23:57     CBC  Recent Labs Lab 09/12/16 0524 09/13/16 0530 09/14/16 0314 09/15/16 0451  WBC 6.5 7.0 5.5 5.7  HGB 13.5 14.4 13.2 13.5  HCT 38.9* 41.5 37.9* 39.5*  PLT 132* 124* 117* 121*  MCV 92.6 93.6 93.5 92.7  MCH 32.2 32.4 32.6 31.7  MCHC 34.8 34.6 34.8 34.2  RDW 13.7 13.7 13.7 13.8  LYMPHSABS 1.7  --   --   --   MONOABS 0.5  --   --   --   EOSABS 0.1  --   --   --   BASOSABS 0.0  --   --   --     Chemistries   Recent Labs Lab  09/12/16 0524 09/13/16 0530 09/14/16 0314 09/15/16 0451  NA 141 143 140 140  K 3.9 4.1 4.1 4.1  CL 106 107 108 109  CO2 28 30 27 26   GLUCOSE 95 90 92 97  BUN 34* 30* 29* 26*  CREATININE 0.98 0.74 0.78  0.61  CALCIUM 9.1 9.2 8.6* 8.9   Cardiac Enzymes  Recent Labs Lab 09/12/16 0524  TROPONINI <0.03   ------------------------------------------------------------------------------------------------------------------ Invalid input(s): POCBNP    Assessment & Plan  83 year old elderly male patient with Parkinson's disease, restless leg syndrome, coronary artery disease, hypertension, hypothyroidism presented to the emergency room with frequent falls.  * Gait instability & recurrent falls: likely multifactorial, PT, OT eval - recommends STR/SNF - Much more alert today  * Dehydration: continue IVFs  * Advanced Parkinson's disease: continue sinemet  * Restless leg syndrome  * Coronary artery disease  * Hypothyroidism: TSH is normal. Continue Synthroid  * severe protein calorie malnutrition: Encourage oral nutrition.   Possible discharge to facility tomorrow with palliative care to follow while there    Time Spent in minutes  30 min  Greater than 50% of time spent in care coordination and counseling patient regarding the condition and plan of care.   Delfino Lovett M.D on 09/15/2016 at 3:23 PM  Between 7am to 6pm - Pager - [phone]  After 6pm go to [email or web address] - password [email or web address] [facility]  [facility] [place] [facility]   Office  [phone]

## 2016-09-15 NOTE — Progress Notes (Signed)
Physical Therapy Treatment Patient Details Name: Anthony Blankenship MRN: [id number] DOB: 03/06/1925 Today's Date: 09/15/2016    History of Present Illness 83 y.o. male with a known history of Parkinson's disease, restless leg syndrome, coronary artery disease, depression, hypertension, hypo thyroidism, nephrolithiasis was referred from [facility] facility secondary to fall.  Pt has had multiple recent falls.    PT Comments    Pt was much more awake this PT session and was able to ambulate into the hallway with good relative confidence.  He continued to need some assist with bed mobility and rising to standing, but once up was able to walk with safe Parkinsonian gait.    Follow Up Recommendations  SNF     Equipment Recommendations       Recommendations for Other Services       Precautions / Restrictions Precautions Precautions: Fall Restrictions Weight Bearing Restrictions: No    Mobility  Bed Mobility Overal bed mobility: Needs Assistance Bed Mobility: Supine to Sit;Sit to Supine     Supine to sit: Min assist Sit to supine: Min assist   General bed mobility comments: Pt showed good effort and decreased need for assist getting to/from sitting.  Transfers Overall transfer level: Needs assistance Equipment used: Rolling walker (2 wheeled) Transfers: Sit to/from Stand Sit to Stand: Mod assist         General transfer comment: Pt needed cuing for hand placement, set up and encouragement.  He struggled to get himself to upright position, but after getting to standing and using the walker he was able to maintain standing well with walker use.    Ambulation/Gait Ambulation/Gait assistance: Min guard;Min assist Ambulation Distance (Feet): 45 Feet Assistive device: Rolling walker (2 wheeled)       General Gait Details: Pt was able to ambulate for short, shuffling but safe steps.  He was reliant on and stooped over the walker but had no LOBs and generally  displayed good confidence and relative safety.  Pt's HR stayed in the the 70s with the effort, and though he described some fatigue it was not excessive.    Stairs            Wheelchair Mobility    Modified Rankin (Stroke Patients Only)       Balance Overall balance assessment: Needs assistance Sitting-balance support: Bilateral upper extremity supported Sitting balance-Leahy Scale: Fair     Standing balance support: Bilateral upper extremity supported Standing balance-Leahy Scale: Fair                      Cognition Arousal/Alertness: Awake/alert Behavior During Therapy: Restless Overall Cognitive Status: Within Functional Limits for tasks assessed                 General Comments: Pt much more awake today than previous session.  Still very soft/weak voice but able to make his thoughts known w/o issue.      Exercises General Exercises - Lower Extremity Ankle Circles/Pumps: AROM;10 reps Long Arc Quad: Strengthening;10 reps Heel Slides: Strengthening;10 reps Hip ABduction/ADduction: Strengthening;10 reps Hip Flexion/Marching: Strengthening;10 reps    General Comments        Pertinent Vitals/Pain Pain Assessment: No/denies pain    Home Living                      Prior Function            PT Goals (current goals can now be found in the care  plan section) Progress towards PT goals: Progressing toward goals    Frequency    Min 2X/week      PT Plan Current plan remains appropriate    Co-evaluation             End of Session Equipment Utilized During Treatment: Gait belt Activity Tolerance: Patient tolerated treatment well;Patient limited by fatigue Patient left: with call bell/phone within reach;with bed alarm set   PT Visit Diagnosis: Muscle weakness (generalized) (M62.81);Difficulty in walking, not elsewhere classified (R26.2)     Time: [id number] PT Time Calculation (min) (ACUTE ONLY): 33 min  Charges:  $Gait  Training: 8-22 mins $Therapeutic Exercise: 8-22 mins                    G Codes:       Malachi ProGalen R Brunette Lavalle, DPT 09/15/2016, 12:56 PM

## 2016-09-15 NOTE — [place] FL2 (Signed)
[place] MEDICA[place] FL2 LEVEL OF CARE SCREENING TOOL     IDENTIFICATION  Patient Name: Anthony Blankenship Birthdate: 10-19-1924 Sex: male Admission Date (Current Location): 09/12/2016  [place]  [place] [id number] Facility and Address:  [facility], [address], [place], [place] [address]      Provider Number: [id number]  Attending Physician Name and Address:  Delfino LovettVipul Shah, MD  Relative Name and Phone Number:  Livingston DionesStarling,[name] M Daughter [phone]     Current Level of Care: Hospital Recommended Level of Care: Skilled Nursing Facility Prior Approval Number:    Date Approved/Denied:   PASRR Number: Pending  Discharge Plan: SNF    Current Diagnoses: Patient Active Problem List   Diagnosis Date Noted  . Parkinson's disease (HCC)   . Palliative care by specialist   . Goals of care, counseling/discussion   . Gait instability 09/13/2016  . Recurrent falls 09/13/2016  . Coronary artery disease involving native coronary artery of native heart with angina pectoris (HCC) 09/08/2016  . Near syncope 06/12/2016  . Lower GI bleed 04/25/2016  . GI bleed 04/25/2016  . Restlessness 05/14/2015    Orientation RESPIRATION BLADDER Height & Weight     Self, Time, Situation, Place  Normal Incontinent Weight: 140 lb (63.5 kg) Height:  5\' 9"  (175.3 cm)  BEHAVIORAL SYMPTOMS/MOOD NEUROLOGICAL BOWEL NUTRITION STATUS      Continent Diet (Dysphagia 2 diet)  AMBULATORY STATUS COMMUNICATION OF NEEDS Skin   Limited Assist Verbally Normal                       Personal Care Assistance Level of Assistance  Bathing, Feeding, Dressing Bathing Assistance: Limited assistance Feeding assistance: Limited assistance Dressing Assistance: Limited assistance     Functional Limitations Info  Sight, Hearing, Speech Sight Info: Adequate Hearing Info: Adequate Speech Info: Adequate    SPECIAL CARE FACTORS FREQUENCY  PT (By licensed PT)     PT  Frequency: 5x a week              Contractures Contractures Info: Not present    Additional Factors Info  Code Status, Allergies Code Status Info: DNR Allergies Info: Uncoded Allergy. Allergen: Sleeping Medications           Current Medications (09/15/2016):  This is the current hospital active medication list Current Facility-Administered Medications  Medication Dose Route Frequency Provider Last Rate Last Dose  . 0.9 %  sodium chloride infusion   Intravenous Continuous Ihor AustinPavan Pyreddy, MD 75 mL/hr at 09/15/16 1607    . acetaminophen (TYLENOL) tablet 650 mg  650 mg Oral Q6H PRN Ihor AustinPavan Pyreddy, MD       Or  . acetaminophen (TYLENOL) suppository 650 mg  650 mg Rectal Q6H PRN Ihor AustinPavan Pyreddy, MD      . entacapone (COMTAN) tablet 200 mg  200 mg Oral QID Ihor AustinPavan Pyreddy, MD   200 mg at 09/15/16 1402   And  . carbidopa-levodopa (SINEMET IR) 25-100 MG per tablet immediate release 2 tablet  2 tablet Oral QID Ihor AustinPavan Pyreddy, MD   2 tablet at 09/15/16 1402  . diazepam (VALIUM) tablet 2 mg  2 mg Oral Q12H PRN Pavan Pyreddy, MD      . enoxaparin (LOVENOX) injection 40 mg  40 mg Subcutaneous Q24H Ihor AustinPavan Pyreddy, MD   40 mg at 09/14/16 2041  . levothyroxine (SYNTHROID, LEVOTHROID) tablet 50 mcg  50 mcg Oral QAC breakfast Ihor AustinPavan Pyreddy, MD   50 mcg at 09/15/16 0836  .  ondansetron (ZOFRAN) tablet 4 mg  4 mg Oral Q6H PRN Ihor Austin, MD       Or  . ondansetron (ZOFRAN) injection 4 mg  4 mg Intravenous Q6H PRN Pavan Pyreddy, MD      . senna-docusate (Senokot-S) tablet 1 tablet  1 tablet Oral QHS PRN Ihor Austin, MD         Discharge Medications: Please see discharge summary for a list of discharge medications.  Relevant Imaging Results:  Relevant Lab Results:   Additional Information SSN [id number]  Darleene Cleaver, [place]

## 2016-09-16 MED ORDER — DIAZEPAM 2 MG PO TABS: 2.0000 mg | ORAL_TABLET | Freq: Two times a day (BID) | ORAL | 0 refills | Status: AC | PRN

## 2016-09-16 NOTE — Clinical Social Work Note (Signed)
Patient to be d/c'ed today to [facility].  Patient and family agreeable to plans will transport via ems RN to call report to Konrad DoloresKim Hicks [phone] room [phone].  Windell MouldingEric Chaye Misch, MSW, Theresia MajorsLCSWA [phone]

## 2016-09-16 NOTE — Progress Notes (Signed)
New referral for palliative medicine consult at facility upon discharge [facility].  Information faxed to referral intake.  Unsure of place of discharge at this time.  Will continue to follow through final disposition.

## 2016-09-16 NOTE — Progress Notes (Signed)
EMS at the bedside to transport patient.

## 2016-09-16 NOTE — Clinical Social Work Note (Signed)
Clinical Social Work Assessment  Patient Details  Name: Anthony Blankenship MRN: [id number] Date of Birth: May 09, 1925  Date of referral:  09/15/16               Reason for consult:  Facility Placement                Permission sought to share information with:  Family Supports, Oceanographeracility Contact Representative Permission granted to share information::  Yes, Verbal Permission Granted  Name::        Agency::  SNF admissions  Relationship::     Contact Information:     Housing/Transportation Living arrangements for the past 2 months:  Assisted Living Facility ([facility] Source of Information:  Patient Patient Interpreter Needed:  None Criminal Activity/Legal Involvement Pertinent to Current Situation/Hospitalization:  No - Comment as needed Significant Relationships:  Adult Children Lives with:  Facility Resident Do you feel safe going back to the place where you live?  No Need for family participation in patient care:  No (Coment)  Care giving concerns:  Patient feels he needs short term rehab before he is able to return back to [facility].   Social Worker assessment / plan: Patient is a 83 year old male who is alert and oriented x4 and able to express his feelings.  Patient lives at [facility], and plans to return once he has received some therapy.  Patient states he has been to rehab in the past, CSW reminded him of what to expect and what the process is for bed placement.  Patient was explained role of CSW, and was given permission to begin bed search process.  Patient did not express any questions, and he was informed how insurance will pay for his stay at SNF.  Employment status:  Retired Health and safety inspectornsurance information:  Armed forces operational officerMedicare, [place] Recommendations:  Skilled Nursing Facility Information / Referral to community resources:  Skilled Nursing Facility  Patient/Family's Response to care:  Patient in agreement to going to SNF.  Patient/Family's Understanding of and  Emotional Response to Diagnosis, Current Treatment, and Prognosis: Patient expressed he is looking forward to going to rehab so he can become more mobile and return back to his ALF.  Emotional Assessment Appearance:  Appears stated age Attitude/Demeanor/Rapport:    Affect (typically observed):  Calm, Stable, Pleasant Orientation:  Oriented to Self, Oriented to Place, Oriented to  Time, Oriented to Situation Alcohol / Substance use:  Not Applicable Psych involvement (Current and /or in the community):  No (Comment)  Discharge Needs  Concerns to be addressed:  Lack of Support Readmission within the last 30 days:  No Current discharge risk:  Lack of support system Barriers to Discharge:  No Barriers Identified   Darleene Cleavernterhaus, Vanity Larsson R, LCSWA 09/16/2016, 1:47 PM

## 2016-09-16 NOTE — Clinical Social Work Placement (Signed)
   CLINICAL SOCIAL WORK PLACEMENT  NOTE  Date:  09/16/2016  Patient Details  Name: Anthony Blankenship MRN: [id number] Date of Birth: 1924-07-03  Clinical Social Work is seeking post-discharge placement for this patient at the Skilled  Nursing Facility level of care (*CSW will initial, date and re-position this form in  chart as items are completed):  Yes   Patient/family provided with [facility] Clinical Social Work Department's list of facilities offering this level of care within the geographic area requested by the patient (or if unable, by the patient's family).  Yes   Patient/family informed of their freedom to choose among providers that offer the needed level of care, that participate in Medicare, Medicaid or managed care program needed by the patient, have an available bed and are willing to accept the patient.  Yes   Patient/family informed of [facility]'s ownership interest in [facility] and [facility], as well as of the fact that they are under no obligation to receive care at these facilities.  PASRR submitted to EDS on 09/15/16     PASRR number received on 09/15/16     Existing PASRR number confirmed on 09/16/16     FL2 transmitted to all facilities in geographic area requested by pt/family on       FL2 transmitted to all facilities within larger geographic area on       Patient informed that his/her managed care company has contracts with or will negotiate with certain facilities, including the following:        Yes   Patient/family informed of bed offers received.  [facility]     Physician recommends and patient chooses bed at      Patient to be transferred to [facility] on 09/16/16.  Patient to be transferred to facility [facility]     Patient family notified on 09/16/16 of transfer.  Name of family member notified:  Patient and his daughter Eber JonesCarolyn     PHYSICIAN Please sign FL2, Please  sign DNR     Additional Comment:    _______________________________________________ Darleene CleaverAnterhaus, Dannetta Lekas R, LCSWA 09/16/2016, 1:52 PM

## 2016-09-16 NOTE — Plan of Care (Signed)
Problem: Safety: Goal: Ability to remain free from injury will improve Outcome: Progressing Bed alarm activated, non skid socks in place [facility], floor mats in place. Call bell within reach. Will continue to monitor.  Problem: Tissue Perfusion: Goal: Risk factors for ineffective tissue perfusion will decrease Outcome: Progressing lovenox for VTE  Problem: Fluid Volume: Goal: Ability to maintain a balanced intake and output will improve Outcome: Progressing Pt with IV fluids @ 7375ml/hr  Problem: Nutrition: Goal: Adequate nutrition will be maintained Outcome: Progressing Pt has a great appetite here, ate a whole sandwich tray with chocolate milk.

## 2016-09-16 NOTE — Discharge Summary (Signed)
[facility] [place] [facility]   PATIENT NAME: Anthony Blankenship    MR#:  [id number]  DATE OF BIRTH:  August 11, 1924  DATE OF ADMISSION:  09/12/2016   ADMITTING PHYSICIAN: Ihor AustinPavan Pyreddy, MD  DATE OF DISCHARGE: 09/16/2016  PRIMARY CARE PHYSICIAN: Royetta AsalKALRA, AMIT, MD   ADMISSION DIAGNOSIS:  Dehydration [E86.0] Abrasion [T14.8XXA] Fall, initial encounter [id number][W19.XXXA] Contusion of head, unspecified part of head, initial encounter [S00.93XA] DISCHARGE DIAGNOSIS:  Active Problems:   Gait instability   Recurrent falls   Parkinson's disease (HCC)   Palliative care by specialist   Goals of care, counseling/discussion  SECONDARY DIAGNOSIS:   Past Medical History:  Diagnosis Date  . Anxiety   . Coronary artery disease   . Depression   . Hypertension   . Hypothyroidism   . Kidney stones   . Parkinson's disease (HCC)   . Restless leg syndrome    HOSPITAL COURSE:  83 year old elderly male patient with Parkinson's disease, restless leg syndrome, coronary artery disease, hypertension, hypothyroidism presented to the emergency room with frequent falls.  *Gait instability & recurrent falls: likely multifactorial, PT, OT eval - recommends STR/SNF  * Dehydration: Resolved with IV hydration.  *Advanced Parkinson's disease: continue sinemet  * Restless leg syndrome  * Coronary artery disease  * Hypothyroidism: TSH is normal. Continue Synthroid  * severe protein calorie malnutrition: Encourage oral nutrition.  DISCHARGE CONDITIONS:  Fair CONSULTS OBTAINED:   DRUG ALLERGIES:   Allergies  Allergen Reactions  . Other Other (See Comments)    Uncoded Allergy. Allergen: Sleeping Medications   DISCHARGE MEDICATIONS:   Allergies as of 09/16/2016      Reactions   Other Other (See Comments)   Uncoded Allergy. Allergen: Sleeping Medications      Medication List    TAKE these medications   acetaminophen 325 MG tablet Commonly known as:  TYLENOL Take  650 mg by mouth every 6 (six) hours as needed for mild pain.   carbidopa-levodopa-entacapone 50-200-200 MG tablet Commonly known as:  STALEVO Take 1 tablet by mouth 4 (four) times daily.   diazepam 2 MG tablet Commonly known as:  VALIUM Take 1 tablet (2 mg total) by mouth every 12 (twelve) hours as needed for anxiety.   levothyroxine 50 MCG tablet Commonly known as:  SYNTHROID, LEVOTHROID Take 50 mcg by mouth daily.   loperamide 2 MG tablet Commonly known as:  IMODIUM A-D Take 2 mg by mouth as needed for diarrhea or loose stools. Do not exceed 8 doses in 24 hours        DISCHARGE INSTRUCTIONS:   DIET:  Regular diet DISCHARGE CONDITION:  Fair ACTIVITY:  Activity as tolerated OXYGEN:  Home Oxygen: No.  Oxygen Delivery: room air DISCHARGE LOCATION:  nursing home With palliative care evaluation.  While at the facility. Overall poor prognosis.  If you experience worsening of your admission symptoms, develop shortness of breath, life threatening emergency, suicidal or homicidal thoughts you must seek medical attention immediately by calling 911 or calling your MD immediately  if symptoms less severe.  You Must read complete instructions/literature along with all the possible adverse reactions/side effects for all the Medicines you take and that have been prescribed to you. Take any new Medicines after you have completely understood and accpet all the possible adverse reactions/side effects.   Please note  You were cared for by a hospitalist during your hospital stay. If you have any questions about your discharge medications or the care you received while you were  in the hospital after you are discharged, you can call the unit and asked to speak with the hospitalist on call if the hospitalist that took care of you is not available. Once you are discharged, your primary care physician will handle any further medical issues. Please note that NO REFILLS for any discharge medications  will be authorized once you are discharged, as it is imperative that you return to your primary care physician (or establish a relationship with a primary care physician if you do not have one) for your aftercare needs so that they can reassess your need for medications and monitor your lab values.    On the day of Discharge:  VITAL SIGNS:  Blood pressure 133/73, pulse (!) 57, temperature 97.4 F (36.3 C), temperature source Oral, resp. rate 16, height 5\' 9"  (1.753 m), weight 63.5 kg (140 lb), SpO2 97 %. PHYSICAL EXAMINATION:  GENERAL:  83 y.o.-year-old patient lying in the bed with no acute distress.  EYES: Pupils equal, round, reactive to light and accommodation. No scleral icterus. Extraocular muscles intact.  HEENT: Head atraumatic, normocephalic. Oropharynx and nasopharynx clear.  NECK:  Supple, no jugular venous distention. No thyroid enlargement, no tenderness.  LUNGS: Normal breath sounds bilaterally, no wheezing, rales,rhonchi or crepitation. No use of accessory muscles of respiration.  CARDIOVASCULAR: S1, S2 normal. No murmurs, rubs, or gallops.  ABDOMEN: Soft, non-tender, non-distended. Bowel sounds present. No organomegaly or mass.  EXTREMITIES: No pedal edema, cyanosis, or clubbing.  NEUROLOGIC: Cranial nerves II through XII are intact. Muscle strength 5/5 in all extremities. Sensation intact. Gait not checked.  PSYCHIATRIC: The patient is alert and oriented x 3.  SKIN: No obvious rash, lesion, or ulcer.  DATA REVIEW:   CBC  Recent Labs Lab 09/15/16 0451  WBC 5.7  HGB 13.5  HCT 39.5*  PLT 121*    Chemistries   Recent Labs Lab 09/15/16 0451  NA 140  K 4.1  CL 109  CO2 26  GLUCOSE 97  BUN 26*  CREATININE 0.61  CALCIUM 8.9    Follow-up Information    [facility] Schedule an appointment as soon as possible for a visit in 2 days.   Why:  PLEASE CALL AND SCHEDULE THIS APPOINTMENT AS SOON AS POSSIBLE. Thanks Contact information: [address] [place] [address] [phone]        Royetta Asal, MD. Schedule an appointment as soon as possible for a visit in 1 week.   Specialty:  Internal Medicine Why:  PLEASE CALL AND SCHEDULE THIS APPOINTMENT AS SOON AS POSSIBLE. Thanks Contact information: [address] [place] [address] [phone]           Management plans discussed with the patient, family and they are in agreement.  CODE STATUS: DNR   TOTAL TIME TAKING CARE OF THIS PATIENT: 45 minutes.    Delfino Lovett M.D on 09/16/2016 at 11:06 AM  Between 7am to 6pm - Pager - [phone]  After 6pm go to [email or web address] - Social research officer, government  [facility]  Office  [phone]  CC: Primary care physician; Royetta Asal, MD   Note: This dictation was prepared with Dragon dictation along with smaller phrase technology. Any transcriptional errors that result from this process are unintentional.

## 2016-09-16 NOTE — Progress Notes (Signed)
Report called to GrenadaBrittany, Charity fundraiserN at [facility]. Transport requested, told there were five patients on the list. [place]Brittany, RN at [facility] updated.

## 2016-10-26 ENCOUNTER — Emergency Department
Admission: EM | Admit: 2016-10-26 | Discharge: 2016-10-26 | Disposition: A | Discharge status: Home / Self Care | Payer: Medicare Other | Attending: Emergency Medicine | Admitting: Emergency Medicine

## 2016-10-26 ENCOUNTER — Emergency Department: Payer: Medicare Other

## 2016-10-26 DIAGNOSIS — G20 Parkinson's disease: Secondary | ICD-10-CM | POA: Insufficient documentation

## 2016-10-26 DIAGNOSIS — I10 Essential (primary) hypertension: Secondary | ICD-10-CM | POA: Diagnosis not present

## 2016-10-26 DIAGNOSIS — E039 Hypothyroidism, unspecified: Secondary | ICD-10-CM | POA: Diagnosis not present

## 2016-10-26 DIAGNOSIS — I25810 Atherosclerosis of coronary artery bypass graft(s) without angina pectoris: Secondary | ICD-10-CM | POA: Diagnosis not present

## 2016-10-26 DIAGNOSIS — Z951 Presence of aortocoronary bypass graft: Secondary | ICD-10-CM | POA: Insufficient documentation

## 2016-10-26 DIAGNOSIS — R0602 Shortness of breath: Secondary | ICD-10-CM

## 2016-10-26 DIAGNOSIS — Z79899 Other long term (current) drug therapy: Secondary | ICD-10-CM | POA: Insufficient documentation

## 2016-10-26 LAB — URINALYSIS, COMPLETE (UACMP) WITH MICROSCOPIC
BACTERIA UA: NONE SEEN
Bilirubin Urine: NEGATIVE
Glucose, UA: NEGATIVE mg/dL
Hgb urine dipstick: NEGATIVE
KETONES UR: 5 mg/dL — AB
LEUKOCYTES UA: NEGATIVE
Nitrite: NEGATIVE
PH: 6 (ref 5.0–8.0)
Protein, ur: NEGATIVE mg/dL
SQUAMOUS EPITHELIAL / LPF: NONE SEEN
Specific Gravity, Urine: 1.04 — ABNORMAL HIGH (ref 1.005–1.030)

## 2016-10-26 LAB — INFLUENZA PANEL BY PCR (TYPE A & B)
INFLAPCR: NEGATIVE
INFLBPCR: NEGATIVE

## 2016-10-26 LAB — COMPREHENSIVE METABOLIC PANEL
ALBUMIN: 3.9 g/dL (ref 3.5–5.0)
ALK PHOS: 43 U/L (ref 38–126)
ALT: 14 U/L — AB (ref 17–63)
AST: 25 U/L (ref 15–41)
Anion gap: 6 (ref 5–15)
BILIRUBIN TOTAL: 0.6 mg/dL (ref 0.3–1.2)
BUN: 34 mg/dL — AB (ref 6–20)
CALCIUM: 8.7 mg/dL — AB (ref 8.9–10.3)
CO2: 27 mmol/L (ref 22–32)
CREATININE: 1.14 mg/dL (ref 0.61–1.24)
Chloride: 107 mmol/L (ref 101–111)
GFR calc Af Amer: >60 mL/min (ref >60)
GFR, EST NON AFRICAN AMERICAN: 55 mL/min — AB (ref >60)
GLUCOSE: 134 mg/dL — AB (ref 65–99)
POTASSIUM: 4.2 mmol/L (ref 3.5–5.1)
Sodium: 140 mmol/L (ref 135–145)
TOTAL PROTEIN: 6.5 g/dL (ref 6.5–8.1)

## 2016-10-26 LAB — CBC WITH DIFFERENTIAL/PLATELET
BASOS ABS: 0 10*3/uL (ref 0–0.1)
BASOS PCT: 0 %
Eosinophils Absolute: 0.1 10*3/uL (ref 0–0.7)
Eosinophils Relative: 1 %
HEMATOCRIT: 36.9 % — AB (ref 40.0–52.0)
HEMOGLOBIN: 12.7 g/dL — AB (ref 13.0–18.0)
LYMPHS PCT: 20 %
Lymphs Abs: 1.3 10*3/uL (ref 1.0–3.6)
MCH: 32.5 pg (ref 26.0–34.0)
MCHC: 34.4 g/dL (ref 32.0–36.0)
MCV: 94.4 fL (ref 80.0–100.0)
MONO ABS: 0.4 10*3/uL (ref 0.2–1.0)
Monocytes Relative: 6 %
NEUTROS ABS: 4.9 10*3/uL (ref 1.4–6.5)
NEUTROS PCT: 73 %
Platelets: 140 10*3/uL — ABNORMAL LOW (ref 150–440)
RBC: 3.91 MIL/uL — ABNORMAL LOW (ref 4.40–5.90)
RDW: 13.6 % (ref 11.5–14.5)
WBC: 6.7 10*3/uL (ref 3.8–10.6)

## 2016-10-26 LAB — TROPONIN I: Troponin I: <0.03 ng/mL (ref <0.03)

## 2016-10-26 MED ORDER — IOPAMIDOL (ISOVUE-370) INJECTION 76%
75.0000 mL | Freq: Once | INTRAVENOUS | Status: AC | PRN
  Administered 2016-10-26: 75 mL via INTRAVENOUS

## 2016-10-26 MED ORDER — ACETAMINOPHEN 500 MG PO TABS
1000.0000 mg | ORAL_TABLET | Freq: Once | ORAL | Status: AC
  Administered 2016-10-26: 1000 mg via ORAL
  Filled 2016-10-26: qty 2

## 2016-10-26 NOTE — ED Provider Notes (Signed)
[facility] Emergency Department Provider Note  ____________________________________________  Time seen: Approximately 3:22 PM  I have reviewed the triage vital signs and the nursing notes.   HISTORY  Chief Complaint Shortness of Breath   HPI Anthony Blankenship is a 83 y.o. male with h/o Parkinson's, CAD, HTN, dementia who presents from the [place] [facility] [place] for shortness of breath. History is gathered from nursing staff at the [place]. According to them patient today was complaining of shortness of breath. Symptoms started today, mild, constant for a few hours and associated with hypoxia with oxygen sats of 89%. EMS was called. No fever per SNF. Upon arrival patient's sats were within normal limits, he was afebrile with normal vital signs. Patient tells me that his been feeling short of breath but is unable to tell me for how long. Patient tells me he has no chest pain, no abdominal pain, no nausea, no headache. According to SNF no N/V/D. Patient was admitted in March 2018 for dehydration and fall.   Past Medical History:  Diagnosis Date  . Anxiety   . Coronary artery disease   . Depression   . Hypertension   . Hypothyroidism   . Kidney stones   . Parkinson's disease (HCC)   . Restless leg syndrome     Patient Active Problem List   Diagnosis Date Noted  . Parkinson's disease (HCC)   . Palliative care by specialist   . Goals of care, counseling/discussion   . Gait instability 09/13/2016  . Recurrent falls 09/13/2016  . Coronary artery disease involving native coronary artery of native heart with angina pectoris (HCC) 09/08/2016  . Near syncope 06/12/2016  . Lower GI bleed 04/25/2016  . GI bleed 04/25/2016  . Restlessness 05/14/2015    Past Surgical History:  Procedure Laterality Date  . APPENDECTOMY    . CARDIAC CATHETERIZATION    . CORONARY ARTERY BYPASS GRAFT      Prior to Admission medications   Medication Sig Start Date End Date Taking?  Authorizing Provider  acetaminophen (TYLENOL) 325 MG tablet Take 650 mg by mouth every 6 (six) hours as needed for mild pain.    Yes Historical Provider, MD  carbidopa-levodopa-entacapone (STALEVO) 50-200-200 MG tablet Take 1 tablet by mouth 4 (four) times daily.   Yes Historical Provider, MD  diazepam (VALIUM) 2 MG tablet Take 1 tablet (2 mg total) by mouth every 12 (twelve) hours as needed for anxiety. 09/16/16  Yes Vipul Sherryll Burger, MD  diphenhydrAMINE (BENADRYL) 25 MG tablet Take 25 mg by mouth every 6 (six) hours as needed.   Yes Historical Provider, MD  guaifenesin (ROBITUSSIN) 100 MG/5ML syrup Take 300 mg by mouth 4 (four) times daily as needed for cough.   Yes Historical Provider, MD  levothyroxine (SYNTHROID, LEVOTHROID) 75 MCG tablet Take 75 mcg by mouth daily.    Yes Historical Provider, MD  loperamide (IMODIUM A-D) 2 MG tablet Take 2 mg by mouth as needed for diarrhea or loose stools. Do not exceed 8 doses in 24 hours   Yes Historical Provider, MD  magnesium hydroxide (MILK OF MAGNESIA) 400 MG/5ML suspension Take 30 mLs by mouth daily as needed for mild constipation.   Yes Historical Provider, MD  Melatonin 3 MG TABS Take 1 tablet by mouth at bedtime.   Yes Historical Provider, MD  QUEtiapine (SEROQUEL) 25 MG tablet Take 25 mg by mouth at bedtime.   Yes Historical Provider, MD  sertraline (ZOLOFT) 50 MG tablet Take 50 mg by mouth  daily.   Yes Historical Provider, MD  traZODone (DESYREL) 50 MG tablet Take 25 mg by mouth at bedtime.   Yes Historical Provider, MD    Allergies Other  Family History  Problem Relation Age of Onset  . Liver disease Father   . Heart disease Mother   . Kidney disease Neg Hx   . Prostate cancer Neg Hx   . Bladder Cancer Neg Hx     Social History Social History  Substance Use Topics  . Smoking status: Never Smoker  . Smokeless tobacco: Never Used  . Alcohol use No    Review of Systems  Constitutional: Negative for fever. Eyes: Negative for visual  changes. ENT: Negative for sore throat. Neck: No neck pain  Cardiovascular: Negative for chest pain. Respiratory: + shortness of breath. Gastrointestinal: Negative for abdominal pain, vomiting or diarrhea. Genitourinary: Negative for dysuria. Musculoskeletal: Negative for back pain. Skin: Negative for rash. Neurological: Negative for headaches, weakness or numbness. Psych: No SI or HI  ____________________________________________   PHYSICAL EXAM:  VITAL SIGNS: ED Triage Vitals  Enc Vitals Group     BP 10/26/16 1518 (!) 136/99     Pulse Rate 10/26/16 1518 63     Resp 10/26/16 1518 18     Temp 10/26/16 1518 (!) 100.6 F (38.1 C)     Temp Source 10/26/16 1518 Rectal     SpO2 10/26/16 1518 98 %     Weight 10/26/16 1515 150 lb (68 kg)     Height 10/26/16 1515  (1.753 m)     Head Circumference --      [facility] Flow --      Pain Score 10/26/16 1514 0     Pain Loc --      Pain Edu? --      Excl. in GC? --     Constitutional: Alert and oriented to self only, no distress. HEENT:      Head: Normocephalic and atraumatic.         Eyes: Conjunctivae are normal. Sclera is non-icteric. EOMI. PERRL      Mouth/Throat: Mucous membranes are moist.       Neck: Supple with no signs of meningismus. Cardiovascular: Regular rate and rhythm. No murmurs, gallops, or rubs. 2+ symmetrical distal pulses are present in all extremities. No JVD. Respiratory: Normal respiratory effort. Lungs are clear to auscultation bilaterally. No wheezes, crackles, or rhonchi.  Gastrointestinal: Soft, non tender, and non distended with positive bowel sounds. No rebound or guarding. Musculoskeletal: Nontender with normal range of motion in all extremities. No edema, cyanosis, or erythema of extremities. Neurologic: Parkinsons tremor, moves all extremities, face is symmetric Skin: Skin is warm, dry and intact. No rash noted.   ____________________________________________   LABS (all labs ordered are listed, but  only abnormal results are displayed)  Labs Reviewed  CBC WITH DIFFERENTIAL/PLATELET - Abnormal; Notable for the following:       Result Value   RBC 3.91 (*)    Hemoglobin 12.7 (*)    HCT 36.9 (*)    Platelets 140 (*)    All other components within normal limits  COMPREHENSIVE METABOLIC PANEL - Abnormal; Notable for the following:    Glucose, Bld 134 (*)    BUN 34 (*)    Calcium 8.7 (*)    ALT 14 (*)    GFR calc non Af Amer 55 (*)    All other components within normal limits  URINALYSIS, COMPLETE (UACMP) WITH MICROSCOPIC - Abnormal; Notable for  the following:    Color, Urine YELLOW (*)    APPearance CLEAR (*)    Specific Gravity, Urine 1.040 (*)    Ketones, ur 5 (*)    All other components within normal limits  TROPONIN I  INFLUENZA PANEL BY PCR (TYPE A & B)  TROPONIN I   ____________________________________________  EKG  ED ECG REPORT I, Nita Sickle, the attending physician, personally viewed and interpreted this ECG.  Normal sinus rhythm, rate of 60, incomplete right bundle branch block, normal PR and QTc intervals, normal axis, no ST elevations or depressions, T-wave flattening and inversion in 1 and aVL. No changes from prior from March 2008 ____________________________________________  RADIOLOGY  CTA chest: Negative for a pulmonary embolism.  No acute chest abnormality.  Few small pulmonary nodules, largest measures 6 mm in left lung ____________________________________________   PROCEDURES  Procedure(s) performed: None Procedures Critical Care performed:  None ____________________________________________   INITIAL IMPRESSION / ASSESSMENT AND PLAN / ED COURSE  83 y.o. male with h/o Parkinson's, CAD, HTN, dementia who presents from the [place] for shortness of breath and hypoxia that started this evening. Patient is no respiratory distress, normal work of breathing, satting 98% on room air, lungs are clear to auscultation with no crackles or  wheezing. Remainder of his physical exam with no acute findings. Patient does have a fever of 100.60F. We'll check chest x-ray to rule out pneumonia, flu swab, basic blood work. EKG with no ischemic changes.  Clinical Course as of Oct 26 2049  Wed Oct 26, 2016  1610 Patient has remained stable here with no evidence of PNA, PE, normal troponin x 2. No UTI. Negative flu. Patient probably with viral URI. Vitals remain stable with no evidence of hypoxia during 4 hours obs in the ED. Patient is tolerating PO. Will dc back to facility.  [CV]    Clinical Course User Index [CV] Nita Sickle, MD    Pertinent labs & imaging results that were available during my care of the patient were reviewed by me and considered in my medical decision making (see chart for details).    ____________________________________________   FINAL CLINICAL IMPRESSION(S) / ED DIAGNOSES  Final diagnoses:  Shortness of breath  SOB (shortness of breath)      NEW MEDICATIONS STARTED DURING THIS VISIT:  New Prescriptions   No medications on file     Note:  This document was prepared using Dragon voice recognition software and may include unintentional dictation errors.    Nita Sickle, MD 10/26/16 2051

## 2016-10-26 NOTE — ED Notes (Signed)
When ems arrived with pt they handed this nurse a watch that is silver in color with a blue face - the back was off of the watch and the battery missing - this nurse was advised by ems that the watch was broken when they picked pt up for transport and started his IV - watch secured in plastic bag to be transported back to [facility] with pt when he leaves - charge nurse notified

## 2016-10-26 NOTE — ED Notes (Signed)
Pt arrived via em[facility] from [facility] for c/o shortness of breath - pt respirations are even and unlabored - lung sounds are clear in all lobes - O2 sat 97% on room air

## 2016-10-26 NOTE — ED Triage Notes (Signed)
Pt arrived via em[facility] from [facility] for c/o shortness of breath - pt respirations are even and unlabored - lung sounds are clear in all lobes - O2 sat 97% on room air 

## 2016-11-08 ENCOUNTER — Ambulatory Visit: Payer: Medicare Other | Admitting: Physician Assistant

## 2016-11-09 ENCOUNTER — Ambulatory Visit: Payer: Medicare Other | Admitting: Internal Medicine

## 2016-12-14 ENCOUNTER — Ambulatory Visit (INDEPENDENT_AMBULATORY_CARE_PROVIDER_SITE_OTHER): Payer: Medicare Other | Admitting: Internal Medicine

## 2016-12-14 ENCOUNTER — Encounter: Payer: Self-pay | Admitting: Internal Medicine

## 2016-12-14 VITALS — BP 110/56 | HR 91 | Ht 69.0 in | Wt 156.0 lb

## 2016-12-14 DIAGNOSIS — R54 Age-related physical debility: Secondary | ICD-10-CM | POA: Diagnosis not present

## 2016-12-14 DIAGNOSIS — I951 Orthostatic hypotension: Secondary | ICD-10-CM | POA: Diagnosis not present

## 2016-12-14 DIAGNOSIS — I2510 Atherosclerotic heart disease of native coronary artery without angina pectoris: Secondary | ICD-10-CM | POA: Diagnosis not present

## 2016-12-14 DIAGNOSIS — R0602 Shortness of breath: Secondary | ICD-10-CM | POA: Diagnosis not present

## 2016-12-14 NOTE — Patient Instructions (Signed)

## 2016-12-14 NOTE — Progress Notes (Signed)
Follow-up Outpatient Visit Date: 12/14/2016  Primary Care Provider: Royetta Asal, MD [address] [place] [address]  Chief Complaint: Dizziness  HPI:  Mr. Kenyon is a 83 y.o. year-old male with history of CAD s/p CABG, HTN, Parkinson's disease, depression/anxiety, and restless legs, who presents for follow-up of orthostatic hypotension and bradycardia. I last saw the patient on 07/05/16 after a preceding hospitalization for syncope. At our follow-up he continued to have orthostatic lightheadedness with significant positional BP drop. His resting heart rate was also found to be low. We proceeded with ambulatory event monitor, which revealed predominantly sinus bradycardia but appropriate heart rate response during the day. Brief episodes of PSVT were also identified.  I last saw Mr. Raysor on 09/07/16, at which time he continued to have intermittent lightheadedness. Today, he reports that his lightheadedness is stable. He also notes occasional episodes of shortness of breath. He presented to the ED with shortness of breath on 10/26/16 and was diagnosed with viral respiratory infection in the setting of low-grade fever and otherwise negative workup. He has continued to have intermittent episodes of shortness of breath that most often occur at rest and can last anywhere from 1 to 3 hours. His caregiver with him today feels that they may be related to anxiety. Of note, Mr. Macomber's balance and tremor have worsened. His Stalevo was recently increased by Dr. Malvin Johns. He ambulates some with assistance, but is also now spending much of his time in a wheelchair. He is concerned about an uneasy feeling that often occurs when he tries to go to sleep. He denies chest pain, palpitations, orthopnea, PND, and edema. He has not had any further falls or syncopal episodes since our last  visit.  --------------------------------------------------------------------------------------------------  Cardiovascular History & Procedures: Cardiovascular Problems:  Recurrent falls and near-syncope  CAD s/p CABG  Risk Factors:  Known CAD, hypertension, male gender, and age > 68  Cath/PCI:  LHC (05/2007): 80% LMCA (further details not available)  CV Surgery:  CABG (06/25/07, [facility]): LIMA->LAD, SVG->D1, and SVG->OM1  EP Procedures and Devices:  14-day event monitor (07/05/16): Predominant rhythm was sinus with an average rate of 56 bpm (range 37 and 92 bpm in sinus rhythm). Longest RR interval was 1.6 seconds. Rare isolated PACs and atrial couplets were observed. Rare isolated PVCs also noted. 4 episodes of supraventricular tachycardia lasting up to 13 beats were noted with a maximal rate of 154 bpm. Top a chill rhythm versus junctional rhythm were noted.  Non-Invasive Evaluation(s):  TTE (06/12/16): Normal LV size with moderate LVH.  LVEF 55-60% with normal diastolic parameters.  Mild AR and MR.  Moderate-severe TR.  Normal RV size and function.  Recent CV Pertinent Labs: Lab Results  Component Value Date   INR 1.0 09/28/2011   K 4.2 10/26/2016   K 3.8 10/18/2014   BUN 34 (H) 10/26/2016   BUN 46 (H) 10/18/2014   CREATININE 1.14 10/26/2016   CREATININE 1.62 (H) 10/18/2014    Past medical and surgical history were reviewed and updated in EPIC.  Outpatient Encounter Prescriptions as of 12/14/2016  Medication Sig  . acetaminophen (TYLENOL) 325 MG tablet Take 650 mg by mouth every 6 (six) hours as needed for mild pain.   . carbidopa-levodopa-entacapone (STALEVO) 50-200-200 MG tablet Take 1 tablet by mouth 4 (four) times daily.  . diazepam (VALIUM) 2 MG tablet Take 1 tablet (2 mg total) by mouth every 12 (twelve) hours as needed for anxiety.  . diclofenac sodium (VOLTAREN) 1 %  GEL Apply 1 g topically 3 (three) times daily.  . diphenhydrAMINE (BENADRYL) 25 MG tablet  Take 25 mg by mouth every 6 (six) hours as needed.  Marland Kitchen. guaifenesin (ROBITUSSIN) 100 MG/5ML syrup Take 300 mg by mouth 4 (four) times daily as needed for cough.  . levothyroxine (SYNTHROID, LEVOTHROID) 75 MCG tablet Take 75 mcg by mouth daily.   Marland Kitchen. loperamide (IMODIUM A-D) 2 MG tablet Take 2 mg by mouth as needed for diarrhea or loose stools. Do not exceed 8 doses in 24 hours  . magnesium hydroxide (MILK OF MAGNESIA) 400 MG/5ML suspension Take 30 mLs by mouth daily as needed for mild constipation.  . Melatonin 3 MG TABS Take 1 tablet by mouth at bedtime.  Marland Kitchen. QUEtiapine (SEROQUEL) 25 MG tablet Take 25 mg by mouth at bedtime.  . sertraline (ZOLOFT) 50 MG tablet Take 50 mg by mouth daily.  . traZODone (DESYREL) 50 MG tablet Take 25 mg by mouth at bedtime.  . Vitamin D, Ergocalciferol, (DRISDOL) 50000 units CAPS capsule Take 50,000 Units by mouth every 7 (seven) days.   No facility-administered encounter medications on file as of 12/14/2016.     Allergies: Other  Social History   Social History  . Marital status: Divorced    Spouse name: N/A  . Number of children: N/A  . Years of education: N/A   Occupational History  . Not on file.   Social History Main Topics  . Smoking status: Never Smoker  . Smokeless tobacco: Never Used  . Alcohol use No  . Drug use: No  . Sexual activity: Not on file   Other Topics Concern  . Not on file   Social History Narrative  . No narrative on file    Family History  Problem Relation Age of Onset  . Liver disease Father   . Heart disease Mother   . Kidney disease Neg Hx   . Prostate cancer Neg Hx   . Bladder Cancer Neg Hx     Review of Systems: A 12-system review of systems was performed and was negative except as noted in the HPI.  --------------------------------------------------------------------------------------------------  Physical Exam: BP (!) 110/56 (BP Location: Left Arm, Patient Position: Sitting, Cuff Size: Normal)   Pulse 91    Ht 5\' 9"  (1.753 m)   Wt 156 lb (70.8 kg)   BMI 23.04 kg/m   General:  Frail, elderly man seated in a wheelchair. He has a pronounced resting tremor. His caregiver is with him again today. HEENT: Mild conjunctival pallor is noted. No scleral icterus.  Moist mucous membranes.  OP clear. Neck: Supple without lymphadenopathy, thyromegaly, JVD, or HJR. Lungs: Normal work of breathing.  Clear to auscultation bilaterally without wheezes or crackles. Heart: Distant heart sounds. Regular rate and rhythm without murmurs or rubs.  Non-displaced PMI. Abd: Bowel sounds present.  Soft, NT/ND without hepatosplenomegaly Ext: Trace ankle edema.  Radial, PT, and DP pulses are 2+ bilaterally. Skin: Warm and dry without rash.  EKG: Markedly limited due to artifact from tremor. Appears to be sinus bradycardia with incomplete right bundle branch block.  Lab Results  Component Value Date   WBC 6.7 10/26/2016   HGB 12.7 (L) 10/26/2016   HCT 36.9 (L) 10/26/2016   MCV 94.4 10/26/2016   PLT 140 (L) 10/26/2016    Lab Results  Component Value Date   NA 140 10/26/2016   K 4.2 10/26/2016   CL 107 10/26/2016   CO2 27 10/26/2016   BUN 34 (H) 10/26/2016  CREATININE 1.14 10/26/2016   GLUCOSE 134 (H) 10/26/2016   ALT 14 (L) 10/26/2016    No results found for: CHOL, HDL, LDLCALC, LDLDIRECT, TRIG, CHOLHDL  --------------------------------------------------------------------------------------------------  ASSESSMENT AND PLAN: Orthostatic hypotension Blood pressure is low normal today. Patient continues to have some intermittent lightheadedness, though he has not had any falls. I suspect his symptoms are multifactorial. I stressed the importance of fall precautions. We will not make any medication changes today.  Shortness of breath This could be due to any number of factors, including underlying CAD, pulmonary disease, and deconditioning. Given his frailty, I am hesitant to undertake any invasive tests. He  appears euvolemic on exam today. We will continue his current medication regimen.  Coronary artery disease without angina No further chest pain since his prior visit. He is not on any medications for secondary prevention given his age, frailty, and history of falls.  Frailty Mr. Joynt continues to decline in regard to his functional status. Unfortunately, this is likely multifactorial and being driven largely by his Parkinson's disease. We have agreed that invasive testing is not in his best interest, as significant risks exist. He should continue to follow-up with Dr. Malvin Johns for management of his Parkinson's disease.  Follow-up: Return to clinic in 6 months.  Yvonne Kendall, MD 12/14/2016 10:09 AM

## 2016-12-26 ENCOUNTER — Emergency Department
Admission: EM | Admit: 2016-12-26 | Discharge: 2016-12-26 | Disposition: A | Discharge status: Home / Self Care | Payer: Medicare Other | Attending: Emergency Medicine | Admitting: Emergency Medicine

## 2016-12-26 ENCOUNTER — Emergency Department: Payer: Medicare Other

## 2016-12-26 ENCOUNTER — Encounter: Payer: Self-pay | Admitting: Emergency Medicine

## 2016-12-26 DIAGNOSIS — I951 Orthostatic hypotension: Secondary | ICD-10-CM | POA: Insufficient documentation

## 2016-12-26 DIAGNOSIS — E079 Disorder of thyroid, unspecified: Secondary | ICD-10-CM | POA: Insufficient documentation

## 2016-12-26 DIAGNOSIS — G20 Parkinson's disease: Secondary | ICD-10-CM | POA: Diagnosis not present

## 2016-12-26 DIAGNOSIS — I2510 Atherosclerotic heart disease of native coronary artery without angina pectoris: Secondary | ICD-10-CM | POA: Insufficient documentation

## 2016-12-26 DIAGNOSIS — I10 Essential (primary) hypertension: Secondary | ICD-10-CM | POA: Insufficient documentation

## 2016-12-26 DIAGNOSIS — F0280 Dementia in other diseases classified elsewhere without behavioral disturbance: Secondary | ICD-10-CM | POA: Insufficient documentation

## 2016-12-26 DIAGNOSIS — M545 Low back pain: Secondary | ICD-10-CM | POA: Insufficient documentation

## 2016-12-26 DIAGNOSIS — W19XXXA Unspecified fall, initial encounter: Secondary | ICD-10-CM

## 2016-12-26 DIAGNOSIS — Z79899 Other long term (current) drug therapy: Secondary | ICD-10-CM | POA: Insufficient documentation

## 2016-12-26 DIAGNOSIS — M25561 Pain in right knee: Secondary | ICD-10-CM | POA: Insufficient documentation

## 2016-12-26 DIAGNOSIS — W06XXXA Fall from bed, initial encounter: Secondary | ICD-10-CM | POA: Insufficient documentation

## 2016-12-26 LAB — CBC WITH DIFFERENTIAL/PLATELET
Basophils Absolute: 0 10*3/uL (ref 0–0.1)
Basophils Relative: 1 %
EOS PCT: 2 %
Eosinophils Absolute: 0.1 10*3/uL (ref 0–0.7)
HEMATOCRIT: 38.3 % — AB (ref 40.0–52.0)
Hemoglobin: 13.2 g/dL (ref 13.0–18.0)
LYMPHS PCT: 20 %
Lymphs Abs: 1.4 10*3/uL (ref 1.0–3.6)
MCH: 32.5 pg (ref 26.0–34.0)
MCHC: 34.4 g/dL (ref 32.0–36.0)
MCV: 94.5 fL (ref 80.0–100.0)
MONOS PCT: 5 %
Monocytes Absolute: 0.4 10*3/uL (ref 0.2–1.0)
Neutro Abs: 4.8 10*3/uL (ref 1.4–6.5)
Neutrophils Relative %: 72 %
PLATELETS: 128 10*3/uL — AB (ref 150–440)
RBC: 4.05 MIL/uL — ABNORMAL LOW (ref 4.40–5.90)
RDW: 13.2 % (ref 11.5–14.5)
WBC: 6.6 10*3/uL (ref 3.8–10.6)

## 2016-12-26 LAB — BASIC METABOLIC PANEL
Anion gap: 6 (ref 5–15)
BUN: 38 mg/dL — AB (ref 6–20)
CHLORIDE: 107 mmol/L (ref 101–111)
CO2: 29 mmol/L (ref 22–32)
Calcium: 9.1 mg/dL (ref 8.9–10.3)
Creatinine, Ser: 1.08 mg/dL (ref 0.61–1.24)
GFR calc Af Amer: >60 mL/min (ref >60)
GFR, EST NON AFRICAN AMERICAN: 58 mL/min — AB (ref >60)
GLUCOSE: 97 mg/dL (ref 65–99)
POTASSIUM: 4.6 mmol/L (ref 3.5–5.1)
Sodium: 142 mmol/L (ref 135–145)

## 2016-12-26 NOTE — ED Notes (Signed)
Sitting up in chair awaiting for transport

## 2016-12-26 NOTE — Discharge Instructions (Signed)
[name], your exam, labs, and x-rays are normal after your visit to the ED today. Follow-up with your provider as needed. It was my pleasure to take care of you today.

## 2016-12-26 NOTE — ED Provider Notes (Signed)
[facility] Emergency Department Provider Note ____________________________________________  Time seen: 76  I have reviewed the triage vital signs and the nursing notes.  HISTORY  Chief Complaint  Fall  HPI Anthony Blankenship is a 83 y.o. male presents to the ED from [facility], for evaluation following an unwitnessed fall. The patient with a history of Parkinson's disease, and RLS, describes he was trying to stand up and sit on the edge of the bed. He rolled into the bed and slid off to the floor. He denies any injury, trauma, or accident. He also denies any head injury, loss of consciousness. He describes trying to crawl to get to his call bell, but was unable to reach it. When he was found he describes that he had no injury or pain at all. EMS reports that the patient initially had complained of some pain to the right knee in the lower back as well. Patient denies any significant pain or injury at the time of this evaluation.  Past Medical History:  Diagnosis Date  . Anxiety   . Coronary artery disease   . Depression   . Hypertension   . Hypothyroidism   . Kidney stones   . Parkinson's disease (HCC)   . Restless leg syndrome     Patient Active Problem List   Diagnosis Date Noted  . Frailty 12/14/2016  . Orthostatic hypotension 12/14/2016  . Parkinson's disease (HCC)   . Palliative care by specialist   . Goals of care, counseling/discussion   . Gait instability 09/13/2016  . Recurrent falls 09/13/2016  . Coronary artery disease involving native coronary artery of native heart with angina pectoris (HCC) 09/08/2016  . Near syncope 06/12/2016  . Lower GI bleed 04/25/2016  . GI bleed 04/25/2016  . Restlessness 05/14/2015    Past Surgical History:  Procedure Laterality Date  . APPENDECTOMY    . CARDIAC CATHETERIZATION    . CORONARY ARTERY BYPASS GRAFT      Prior to Admission medications   Medication Sig Start Date End Date Taking?  Authorizing Provider  acetaminophen (TYLENOL) 325 MG tablet Take 650 mg by mouth every 6 (six) hours as needed for mild pain.     [provider]  carbidopa-levodopa-entacapone (STALEVO) 50-200-200 MG tablet Take 1 tablet by mouth 4 (four) times daily.    [provider]  diazepam (VALIUM) 2 MG tablet Take 1 tablet (2 mg total) by mouth every 12 (twelve) hours as needed for anxiety. 09/16/16   Delfino Lovett, MD  diclofenac sodium (VOLTAREN) 1 % GEL Apply 1 g topically 3 (three) times daily.    [provider]  diphenhydrAMINE (BENADRYL) 25 MG tablet Take 25 mg by mouth every 6 (six) hours as needed.    [provider]  guaifenesin (ROBITUSSIN) 100 MG/5ML syrup Take 300 mg by mouth 4 (four) times daily as needed for cough.    [provider]  levothyroxine (SYNTHROID, LEVOTHROID) 75 MCG tablet Take 75 mcg by mouth daily.     [provider]  loperamide (IMODIUM A-D) 2 MG tablet Take 2 mg by mouth as needed for diarrhea or loose stools. Do not exceed 8 doses in 24 hours    [provider]  magnesium hydroxide (MILK OF MAGNESIA) 400 MG/5ML suspension Take 30 mLs by mouth daily as needed for mild constipation.    [provider]  Melatonin 3 MG TABS Take 1 tablet by mouth at bedtime.    [provider]  QUEtiapine (SEROQUEL) 25 MG tablet Take 25 mg by mouth at bedtime.    [provider]  sertraline (ZOLOFT) 50 MG tablet Take 50 mg by mouth daily.    [provider]  traZODone (DESYREL) 50 MG tablet Take 25 mg by mouth at bedtime.    [provider]  Vitamin D, Ergocalciferol, (DRISDOL) 50000 units CAPS capsule Take 50,000 Units by mouth every 7 (seven) days.    [provider]    Allergies Other  Family History  Problem Relation Age of Onset  . Liver disease Father   . Heart disease Mother   . Kidney disease Neg Hx   . Prostate cancer Neg Hx   . Bladder Cancer Neg Hx     Social  History Social History  Substance Use Topics  . Smoking status: Never Smoker  . Smokeless tobacco: Never Used  . Alcohol use No    Review of Systems  Constitutional: Negative for fever. Cardiovascular: Negative for chest pain. Respiratory: Negative for shortness of breath. Gastrointestinal: Negative for abdominal pain, vomiting and diarrhea. Genitourinary: Negative for dysuria. Musculoskeletal: Negative for back pain. Skin: Negative for rash. Neurological: Negative for headaches, focal weakness or numbness. ____________________________________________  PHYSICAL EXAM:  VITAL SIGNS: ED Triage Vitals  Enc Vitals Group     BP 12/26/16 1227 (!) 133/94     Pulse Rate 12/26/16 1227 64     Resp --      Temp 12/26/16 1227 99.3 F (37.4 C)     Temp Source 12/26/16 1227 Oral     SpO2 --      Weight 12/26/16 1228 156 lb (70.8 kg)     Height 12/26/16 1228 5\' 9"  (1.753 m)     Head Circumference --      Peak Flow --      Pain Score --      Pain Loc --      Pain Edu? --      Excl. in GC? --     Constitutional: Alert and oriented. Well appearing and in no distress. Head: Normocephalic and atraumatic. Eyes: Conjunctivae are normal. Normal extraocular movements Ears: Canals clear. TMs intact bilaterally. Nose: No congestion/rhinorrhea/epistaxis. Mouth/Throat: Mucous membranes are moist. Neck: Supple. No thyromegaly. Cardiovascular: Normal rate, regular rhythm. Normal distal pulses. Respiratory: Normal respiratory effort. No wheezes/rales/rhonchi. Gastrointestinal: Soft and nontender. No distention. Musculoskeletal: Normal spinal on it without midline tenderness, spasm, deformity, or step-off. Patient with normal benign right knee exam without any signs of internal derangement, effusion, dislocation, or deformity. Nontender with normal range of motion in all extremities.  Neurologic:  Normal speech and language. No gross focal neurologic deficits are appreciated. Skin:  Skin is  warm, dry and intact. No rash noted. ____________________________________________   LABS (pertinent positives/negatives)  Labs Reviewed  BASIC METABOLIC PANEL - Abnormal; Notable for the following:       Result Value   BUN 38 (*)    GFR calc non Af Amer 58 (*)    All other components within normal limits  CBC WITH DIFFERENTIAL/PLATELET - Abnormal; Notable for the following:    RBC 4.05 (*)    HCT 38.3 (*)    Platelets 128 (*)    All other components within normal limits  ____________________________________________   RADIOLOGY  Lumbar Spine  IMPRESSION: No acute abnormality.  Mild multilevel degenerative changes.  Right Knee  IMPRESSION: No evidence of acute abnormality.   Mild tricompartmental degenerative changes.  Chondrocalcinosis/ CPPD. ____________________________________________  INITIAL IMPRESSION / ASSESSMENT  AND PLAN / ED COURSE  Patient with ED evaluation following being found out of his bed from [facility]. He denies any injury or pain at this time. His x-rays are negative and his exam is benign. He has been pleasant in the ED and has consumed chocolate milk and is content. He has also requested to have his face/chin shaved. I have obliged him and he is thankful. He is discharged to [facility] without incident. Attempts to contact his daughter have been unsuccessful.  ____________________________________________  FINAL CLINICAL IMPRESSION(S) / ED DIAGNOSES  Final diagnoses:  Fall, initial encounter      Lissa Hoard, PA-C 12/27/16 1851    Jene Every, MD 12/31/16 2332

## 2016-12-26 NOTE — ED Triage Notes (Signed)
Pt ems from [facility]. Per ems pt slipped out of bed and hit floor. Per ems pt had been c/o right knee and lower back pain. No obvious injury. Pt denies pain at this time.

## 2017-01-24 ENCOUNTER — Emergency Department
Admission: EM | Admit: 2017-01-24 | Discharge: 2017-01-24 | Disposition: A | Discharge status: Home / Self Care | Payer: Medicare Other | Attending: Emergency Medicine | Admitting: Emergency Medicine

## 2017-01-24 ENCOUNTER — Emergency Department: Payer: Medicare Other

## 2017-01-24 ENCOUNTER — Encounter: Payer: Self-pay | Admitting: Emergency Medicine

## 2017-01-24 DIAGNOSIS — Z79899 Other long term (current) drug therapy: Secondary | ICD-10-CM | POA: Insufficient documentation

## 2017-01-24 DIAGNOSIS — R296 Repeated falls: Secondary | ICD-10-CM | POA: Diagnosis not present

## 2017-01-24 DIAGNOSIS — W19XXXA Unspecified fall, initial encounter: Secondary | ICD-10-CM | POA: Diagnosis not present

## 2017-01-24 DIAGNOSIS — G20 Parkinson's disease: Secondary | ICD-10-CM | POA: Diagnosis not present

## 2017-01-24 DIAGNOSIS — Y929 Unspecified place or not applicable: Secondary | ICD-10-CM | POA: Diagnosis not present

## 2017-01-24 DIAGNOSIS — I2510 Atherosclerotic heart disease of native coronary artery without angina pectoris: Secondary | ICD-10-CM | POA: Diagnosis not present

## 2017-01-24 DIAGNOSIS — E039 Hypothyroidism, unspecified: Secondary | ICD-10-CM | POA: Insufficient documentation

## 2017-01-24 DIAGNOSIS — Y999 Unspecified external cause status: Secondary | ICD-10-CM | POA: Diagnosis not present

## 2017-01-24 DIAGNOSIS — Z043 Encounter for examination and observation following other accident: Secondary | ICD-10-CM | POA: Insufficient documentation

## 2017-01-24 DIAGNOSIS — Y9301 Activity, walking, marching and hiking: Secondary | ICD-10-CM | POA: Diagnosis not present

## 2017-01-24 DIAGNOSIS — I10 Essential (primary) hypertension: Secondary | ICD-10-CM | POA: Insufficient documentation

## 2017-01-24 LAB — COMPREHENSIVE METABOLIC PANEL
ALK PHOS: 44 U/L (ref 38–126)
ALT: 27 U/L (ref 17–63)
ANION GAP: 7 (ref 5–15)
AST: 27 U/L (ref 15–41)
Albumin: 4.2 g/dL (ref 3.5–5.0)
BUN: 36 mg/dL — AB (ref 6–20)
CALCIUM: 9.3 mg/dL (ref 8.9–10.3)
CO2: 30 mmol/L (ref 22–32)
CREATININE: 1.06 mg/dL (ref 0.61–1.24)
Chloride: 106 mmol/L (ref 101–111)
GFR, EST NON AFRICAN AMERICAN: 60 mL/min — AB (ref >60)
GLUCOSE: 101 mg/dL — AB (ref 65–99)
Potassium: 4.3 mmol/L (ref 3.5–5.1)
Sodium: 143 mmol/L (ref 135–145)
TOTAL PROTEIN: 6.7 g/dL (ref 6.5–8.1)
Total Bilirubin: 1.1 mg/dL (ref 0.3–1.2)

## 2017-01-24 LAB — CBC WITH DIFFERENTIAL/PLATELET
Basophils Absolute: 0 10*3/uL (ref 0–0.1)
Basophils Relative: 1 %
EOS PCT: 1 %
Eosinophils Absolute: 0.1 10*3/uL (ref 0–0.7)
HCT: 38.8 % — ABNORMAL LOW (ref 40.0–52.0)
HEMOGLOBIN: 13.3 g/dL (ref 13.0–18.0)
LYMPHS ABS: 1.5 10*3/uL (ref 1.0–3.6)
LYMPHS PCT: 24 %
MCH: 31.8 pg (ref 26.0–34.0)
MCHC: 34.3 g/dL (ref 32.0–36.0)
MCV: 92.8 fL (ref 80.0–100.0)
MONOS PCT: 5 %
Monocytes Absolute: 0.3 10*3/uL (ref 0.2–1.0)
Neutro Abs: 4.3 10*3/uL (ref 1.4–6.5)
Neutrophils Relative %: 69 %
PLATELETS: 130 10*3/uL — AB (ref 150–440)
RBC: 4.18 MIL/uL — AB (ref 4.40–5.90)
RDW: 13.2 % (ref 11.5–14.5)
WBC: 6.2 10*3/uL (ref 3.8–10.6)

## 2017-01-24 LAB — URINALYSIS, COMPLETE (UACMP) WITH MICROSCOPIC
BACTERIA UA: NONE SEEN
BILIRUBIN URINE: NEGATIVE
Glucose, UA: NEGATIVE mg/dL
HGB URINE DIPSTICK: NEGATIVE
KETONES UR: NEGATIVE mg/dL
LEUKOCYTES UA: NEGATIVE
NITRITE: NEGATIVE
PH: 5 (ref 5.0–8.0)
Protein, ur: NEGATIVE mg/dL
SQUAMOUS EPITHELIAL / LPF: NONE SEEN
Specific Gravity, Urine: 1.025 (ref 1.005–1.030)

## 2017-01-24 LAB — TSH: TSH: 4.865 u[IU]/mL — ABNORMAL HIGH (ref 0.350–4.500)

## 2017-01-24 LAB — TROPONIN I: Troponin I: <0.03 ng/mL (ref <0.03)

## 2017-01-24 NOTE — ED Notes (Signed)
Patient's daughter Eber JonesCarolyn spoke with the patient on the phone and requested that we give the patient chocolate milk.

## 2017-01-24 NOTE — ED Triage Notes (Signed)
Arrives from [facility] with c/o three falls this morning.  EMS report that staff witnessed patient falls today.  Staff report that mental status is at baseline, however, falls are becoming more frequent.  No other injuries reported.

## 2017-01-24 NOTE — ED Notes (Signed)
EMS at bedside to transport patient.

## 2017-01-24 NOTE — ED Notes (Signed)
Pt given chocolate milk per daughter request.  Pt asked if he needed anything else and he said "when can I go home"  Lm edt

## 2017-01-24 NOTE — ED Provider Notes (Signed)
[facility] Provider Note  ____________________________________________   First MD Initiated Contact with Patient 01/24/17 [phone]     (approximate)  I have reviewed the triage vital signs and the nursing notes.   HISTORY  Chief Complaint Fall   HPI Anthony Blankenship is a 83 y.o. male with a history of coronary artery disease as well as Parkinson's disease was presenting with 3 falls this morning. Per EMS, the patient is having more frequent falls but has not had an altered mentation. The EMS reported that the patient has had a slow decline in his mentation over the past several months. Patient is minimally verbal and able to follow simple commands but is not conversive at this time. History is limited secondary to this but EMS reports that the falls were unwitnessed and works up and falls and appeared also that the patient was losing his balance. There was no loss of consciousness.   Past Medical History:  Diagnosis Date  . Anxiety   . Coronary artery disease   . Depression   . Hypertension   . Hypothyroidism   . Kidney stones   . Parkinson's disease (HCC)   . Restless leg syndrome     Patient Active Problem List   Diagnosis Date Noted  . Frailty 12/14/2016  . Orthostatic hypotension 12/14/2016  . Parkinson's disease (HCC)   . Palliative care by specialist   . Goals of care, counseling/discussion   . Gait instability 09/13/2016  . Recurrent falls 09/13/2016  . Coronary artery disease involving native coronary artery of native heart with angina pectoris (HCC) 09/08/2016  . Near syncope 06/12/2016  . Lower GI bleed 04/25/2016  . GI bleed 04/25/2016  . Restlessness 05/14/2015    Past Surgical History:  Procedure Laterality Date  . APPENDECTOMY    . CARDIAC CATHETERIZATION    . CORONARY ARTERY BYPASS GRAFT      Prior to Admission medications   Medication Sig Start Date End Date Taking? Authorizing Provider    acetaminophen (TYLENOL) 325 MG tablet Take 650 mg by mouth every 6 (six) hours as needed for mild pain.    Yes [provider]  carbidopa-levodopa-entacapone (STALEVO) 50-200-200 MG tablet Take 1 tablet by mouth 4 (four) times daily.   Yes [provider]  diazepam (VALIUM) 2 MG tablet Take 1 tablet (2 mg total) by mouth every 12 (twelve) hours as needed for anxiety. 09/16/16  Yes Delfino LovettShah, Vipul, MD  diclofenac sodium (VOLTAREN) 1 % GEL Apply 1 g topically 3 (three) times daily.   Yes [provider]  diphenhydrAMINE (BENADRYL) 25 MG tablet Take 25 mg by mouth every 6 (six) hours as needed.   Yes [provider]  guaifenesin (ROBITUSSIN) 100 MG/5ML syrup Take 300 mg by mouth 4 (four) times daily as needed for cough.   Yes [provider]  hydrocortisone (ANUSOL-HC) 2.5 % rectal cream Place 1 application rectally 2 (two) times daily.   Yes [provider]  levothyroxine (SYNTHROID, LEVOTHROID) 75 MCG tablet Take 75 mcg by mouth daily.    Yes [provider]  loperamide (IMODIUM A-D) 2 MG tablet Take 2 mg by mouth as needed for diarrhea or loose stools. Do not exceed 8 doses in 24 hours   Yes [provider]  magnesium hydroxide (MILK OF MAGNESIA) 400 MG/5ML suspension Take 30 mLs by mouth daily as needed for mild constipation.   Yes [provider]  Melatonin 3 MG TABS Take 1 tablet by  mouth at bedtime.   Yes [provider]  QUEtiapine (SEROQUEL) 25 MG tablet Take 25 mg by mouth at bedtime.   Yes [provider]  sertraline (ZOLOFT) 50 MG tablet Take 50 mg by mouth daily.   Yes [provider]  traZODone (DESYREL) 50 MG tablet Take 25 mg by mouth at bedtime.   Yes [provider]  Vitamin D, Ergocalciferol, (DRISDOL) 50000 units CAPS capsule Take 50,000 Units by mouth every 7 (seven) days.   Yes [provider]    Allergies Other  Family History  Problem Relation Age of  Onset  . Liver disease Father   . Heart disease Mother   . Kidney disease Neg Hx   . Prostate cancer Neg Hx   . Bladder Cancer Neg Hx     Social History Social History  Substance Use Topics  . Smoking status: Never Smoker  . Smokeless tobacco: Never Used  . Alcohol use No    Review of Systems  Level V caveat secondary to patient minimally verbal   ____________________________________________   PHYSICAL EXAM:  VITAL SIGNS: ED Triage Vitals [01/24/17 0936]  Enc Vitals Group     BP 133/77     Pulse Rate (!) 53     Resp 16     Temp 98.5 F (36.9 C)     Temp Source Oral     SpO2 96 %     Weight 156 lb (70.8 kg)     Height 5\' 9"  (1.753 m)     Head Circumference      Peak Flow      Pain Score      Pain Loc      Pain Edu?      Excl. in GC?     Constitutional: Alert and in no acute distress.  Follows simple commands to move his feet and squeeze my hands. Eyes: Conjunctivae are normal.  Head: Atraumatic. Nose: No congestion/rhinnorhea. Mouth/Throat: Mucous membranes are moist.  Neck: No stridor.   Cardiovascular: Normal rate, regular rhythm. Grossly normal heart sounds.   Respiratory: Normal respiratory effort.  No retractions. Lungs CTAB. Gastrointestinal: Soft and nontender. No distention.  Musculoskeletal: No lower extremity tenderness nor edema.  No joint effusions. Neurologic:  Tremor to the bilateral upper and lower extremities without any focal weakness or facial droop. Skin:  Skin is warm, dry and intact. No rash noted.   ____________________________________________   LABS (all labs ordered are listed, but only abnormal results are displayed)  Labs Reviewed  TSH - Abnormal; Notable for the following:       Result Value   TSH 4.865 (*)    All other components within normal limits  CBC WITH DIFFERENTIAL/PLATELET - Abnormal; Notable for the following:    RBC 4.18 (*)    HCT 38.8 (*)    Platelets 130 (*)    All other components within normal limits    COMPREHENSIVE METABOLIC PANEL - Abnormal; Notable for the following:    Glucose, Bld 101 (*)    BUN 36 (*)    GFR calc non Af Amer 60 (*)    All other components within normal limits  URINALYSIS, COMPLETE (UACMP) WITH MICROSCOPIC - Abnormal; Notable for the following:    Color, Urine YELLOW (*)    APPearance CLEAR (*)    All other components within normal limits  TROPONIN I   ____________________________________________  EKG  ED ECG REPORT I, Arelia LongestSchaevitz,  Romon Devereux M, the attending physician, personally viewed and  interpreted this ECG.   Date: 01/24/2017  EKG Time: 1033  Rate: 56  Rhythm: sinus bradycardia  Axis: Normal  Intervals:nonspecific intraventricular conduction delay  ST&T Change: No ST segment elevation or depression. No abnormal T-wave inversion.  Tremor likely causing the poor baseline and possibly also the extra P-wave appearance in V6 and V5.  ____________________________________________  RADIOLOGY  Chest x-ray without any acute disease. Pelvis x-ray without any acute disease.  CT head without any acute abnormality  ____________________________________________   PROCEDURES  Procedure(s) performed:   Procedures  Critical Care performed:   ____________________________________________   INITIAL IMPRESSION / ASSESSMENT AND PLAN / ED COURSE  Pertinent labs & imaging results that were available during my care of the patient were reviewed by me and considered in my medical decision making (see chart for details).  ----------------------------------------- 12:08 PM on 01/24/2017 -----------------------------------------  Patient without any acute abnormality evident on imaging, urine or blood. I discussed the case with the patient's daughter, Anthony Blankenship, who confirmed that the patient has difficulty speaking because of his Parkinson's and also has a fairly severe baseline tremor. She is understanding of the circumstances of the falls this morning as  well as the workup we have done here in the emergency department. The plan is to discharge the patient. The patient's daughter understands this plan. Falls likely mechanical due to progression of Parkinson's disease and a military dysfunction.      ____________________________________________   FINAL CLINICAL IMPRESSION(S) / ED DIAGNOSES  Fall.    NEW MEDICATIONS STARTED DURING THIS VISIT:  New Prescriptions   No medications on file     Note:  This document was prepared using Dragon voice recognition software and may include unintentional dictation errors.     Myrna Blazer, MD 01/24/17 [phone]

## 2017-01-24 NOTE — ED Notes (Addendum)
Pt dressed and ready for discharge. Pt also had brief changed and is dry for transport. IV removed from pt left top of wrist  Pt given another chocolate milk   Lm edt

## 2017-01-24 NOTE — ED Notes (Signed)
Bed alarm applied.

## 2017-02-07 ENCOUNTER — Encounter: Payer: Self-pay | Admitting: Emergency Medicine

## 2017-02-07 ENCOUNTER — Emergency Department: Payer: Medicare Other

## 2017-02-07 ENCOUNTER — Observation Stay
Admission: EM | Admit: 2017-02-07 | Discharge: 2017-02-09 | Disposition: A | Discharge status: Skilled Nursing Facility | Payer: Medicare Other | Attending: Internal Medicine | Admitting: Internal Medicine

## 2017-02-07 DIAGNOSIS — R296 Repeated falls: Secondary | ICD-10-CM | POA: Diagnosis not present

## 2017-02-07 DIAGNOSIS — R4182 Altered mental status, unspecified: Secondary | ICD-10-CM

## 2017-02-07 DIAGNOSIS — G2581 Restless legs syndrome: Secondary | ICD-10-CM | POA: Insufficient documentation

## 2017-02-07 DIAGNOSIS — M6281 Muscle weakness (generalized): Secondary | ICD-10-CM | POA: Diagnosis not present

## 2017-02-07 DIAGNOSIS — I2510 Atherosclerotic heart disease of native coronary artery without angina pectoris: Secondary | ICD-10-CM | POA: Insufficient documentation

## 2017-02-07 DIAGNOSIS — I10 Essential (primary) hypertension: Secondary | ICD-10-CM | POA: Diagnosis not present

## 2017-02-07 DIAGNOSIS — G20 Parkinson's disease: Secondary | ICD-10-CM | POA: Insufficient documentation

## 2017-02-07 DIAGNOSIS — Z66 Do not resuscitate: Secondary | ICD-10-CM | POA: Diagnosis not present

## 2017-02-07 DIAGNOSIS — Z79899 Other long term (current) drug therapy: Secondary | ICD-10-CM | POA: Diagnosis not present

## 2017-02-07 DIAGNOSIS — R262 Difficulty in walking, not elsewhere classified: Secondary | ICD-10-CM | POA: Insufficient documentation

## 2017-02-07 DIAGNOSIS — E039 Hypothyroidism, unspecified: Secondary | ICD-10-CM | POA: Insufficient documentation

## 2017-02-07 DIAGNOSIS — I472 Ventricular tachycardia, unspecified: Secondary | ICD-10-CM

## 2017-02-07 DIAGNOSIS — Z993 Dependence on wheelchair: Secondary | ICD-10-CM | POA: Insufficient documentation

## 2017-02-07 LAB — URINALYSIS, COMPLETE (UACMP) WITH MICROSCOPIC
BACTERIA UA: NONE SEEN
Bilirubin Urine: NEGATIVE
GLUCOSE, UA: NEGATIVE mg/dL
HGB URINE DIPSTICK: NEGATIVE
KETONES UR: NEGATIVE mg/dL
NITRITE: NEGATIVE
Protein, ur: NEGATIVE mg/dL
Specific Gravity, Urine: 1.02 (ref 1.005–1.030)
pH: 5 (ref 5.0–8.0)

## 2017-02-07 LAB — CBC WITH DIFFERENTIAL/PLATELET
BASOS ABS: 0 10*3/uL (ref 0–0.1)
Basophils Relative: 0 %
EOS ABS: 0.1 10*3/uL (ref 0–0.7)
EOS PCT: 1 %
HCT: 39 % — ABNORMAL LOW (ref 40.0–52.0)
HEMOGLOBIN: 13.5 g/dL (ref 13.0–18.0)
LYMPHS ABS: 1.2 10*3/uL (ref 1.0–3.6)
LYMPHS PCT: 16 %
MCH: 32.3 pg (ref 26.0–34.0)
MCHC: 34.6 g/dL (ref 32.0–36.0)
MCV: 93.4 fL (ref 80.0–100.0)
Monocytes Absolute: 0.4 10*3/uL (ref 0.2–1.0)
Monocytes Relative: 5 %
Neutro Abs: 5.7 10*3/uL (ref 1.4–6.5)
Neutrophils Relative %: 78 %
PLATELETS: 122 10*3/uL — AB (ref 150–440)
RBC: 4.18 MIL/uL — AB (ref 4.40–5.90)
RDW: 13.1 % (ref 11.5–14.5)
WBC: 7.5 10*3/uL (ref 3.8–10.6)

## 2017-02-07 LAB — URINE DRUG SCREEN, QUALITATIVE (ARMC ONLY)
AMPHETAMINES, UR SCREEN: NOT DETECTED
Barbiturates, Ur Screen: NOT DETECTED
Benzodiazepine, Ur Scrn: POSITIVE — AB
COCAINE METABOLITE, UR ~~LOC~~: NOT DETECTED
Cannabinoid 50 Ng, Ur ~~LOC~~: NOT DETECTED
MDMA (ECSTASY) UR SCREEN: NOT DETECTED
METHADONE SCREEN, URINE: NOT DETECTED
Opiate, Ur Screen: NOT DETECTED
Phencyclidine (PCP) Ur S: NOT DETECTED
TRICYCLIC, UR SCREEN: NOT DETECTED

## 2017-02-07 LAB — BASIC METABOLIC PANEL
ANION GAP: 7 (ref 5–15)
BUN: 27 mg/dL — ABNORMAL HIGH (ref 6–20)
CHLORIDE: 102 mmol/L (ref 101–111)
CO2: 29 mmol/L (ref 22–32)
Calcium: 9.2 mg/dL (ref 8.9–10.3)
Creatinine, Ser: 1.05 mg/dL (ref 0.61–1.24)
GFR calc Af Amer: >60 mL/min (ref >60)
Glucose, Bld: 111 mg/dL — ABNORMAL HIGH (ref 65–99)
POTASSIUM: 4.3 mmol/L (ref 3.5–5.1)
SODIUM: 138 mmol/L (ref 135–145)

## 2017-02-07 LAB — T4, FREE: FREE T4: 0.78 ng/dL (ref 0.61–1.12)

## 2017-02-07 LAB — TROPONIN I: Troponin I: <0.03 ng/mL (ref <0.03)

## 2017-02-07 LAB — MAGNESIUM: Magnesium: 2 mg/dL (ref 1.7–2.4)

## 2017-02-07 LAB — MRSA PCR SCREENING: MRSA BY PCR: NEGATIVE

## 2017-02-07 LAB — TSH: TSH: 2.794 u[IU]/mL (ref 0.350–4.500)

## 2017-02-07 MED ORDER — SODIUM CHLORIDE 0.9% FLUSH
3.0000 mL | Freq: Two times a day (BID) | INTRAVENOUS | Status: DC
  Administered 2017-02-07 – 2017-02-09 (×5): 3 mL via INTRAVENOUS

## 2017-02-07 MED ORDER — CARBIDOPA-LEVODOPA-ENTACAPONE 50-200-200 MG PO TABS: 1.0000 | ORAL_TABLET | Freq: Four times a day (QID) | ORAL | Status: DC

## 2017-02-07 MED ORDER — ACETAMINOPHEN 325 MG PO TABS
650.0000 mg | ORAL_TABLET | Freq: Four times a day (QID) | ORAL | Status: DC | PRN
Start: 2017-02-07 — End: 2017-02-09

## 2017-02-07 MED ORDER — ENTACAPONE 200 MG PO TABS
200.0000 mg | ORAL_TABLET | Freq: Four times a day (QID) | ORAL | Status: DC
  Administered 2017-02-07 – 2017-02-08 (×4): 200 mg via ORAL
  Filled 2017-02-07 (×7): qty 1

## 2017-02-07 MED ORDER — ASPIRIN EC 81 MG PO TBEC
81.0000 mg | DELAYED_RELEASE_TABLET | Freq: Every day | ORAL | Status: DC
  Administered 2017-02-07 – 2017-02-09 (×3): 81 mg via ORAL
  Filled 2017-02-07 (×3): qty 1

## 2017-02-07 MED ORDER — CARBIDOPA-LEVODOPA 25-100 MG PO TABS
2.0000 | ORAL_TABLET | Freq: Four times a day (QID) | ORAL | Status: DC
  Administered 2017-02-07 – 2017-02-08 (×4): 2 via ORAL
  Filled 2017-02-07 (×7): qty 2

## 2017-02-07 MED ORDER — SENNOSIDES-DOCUSATE SODIUM 8.6-50 MG PO TABS: 1.0000 | ORAL_TABLET | Freq: Every evening | ORAL | Status: DC | PRN

## 2017-02-07 MED ORDER — PRIMIDONE 50 MG PO TABS
50.0000 mg | ORAL_TABLET | Freq: Every day | ORAL | Status: DC
  Administered 2017-02-07 – 2017-02-08 (×2): 50 mg via ORAL
  Filled 2017-02-07 (×3): qty 1

## 2017-02-07 MED ORDER — QUETIAPINE FUMARATE 25 MG PO TABS
25.0000 mg | ORAL_TABLET | Freq: Every day | ORAL | Status: DC
  Administered 2017-02-07 – 2017-02-08 (×2): 25 mg via ORAL
  Filled 2017-02-07 (×2): qty 1

## 2017-02-07 MED ORDER — TRAZODONE HCL 50 MG PO TABS
25.0000 mg | ORAL_TABLET | Freq: Every day | ORAL | Status: DC
  Administered 2017-02-07 – 2017-02-08 (×2): 25 mg via ORAL
  Filled 2017-02-07 (×2): qty 1

## 2017-02-07 MED ORDER — AMIODARONE HCL 200 MG PO TABS
200.0000 mg | ORAL_TABLET | Freq: Once | ORAL | Status: AC
  Administered 2017-02-07: 200 mg via ORAL
  Filled 2017-02-07: qty 1

## 2017-02-07 MED ORDER — LEVOTHYROXINE SODIUM 50 MCG PO TABS
75.0000 ug | ORAL_TABLET | Freq: Every day | ORAL | Status: DC
  Administered 2017-02-07 – 2017-02-09 (×3): 75 ug via ORAL
  Filled 2017-02-07 (×3): qty 1

## 2017-02-07 MED ORDER — MAGNESIUM HYDROXIDE 400 MG/5ML PO SUSP: 30.0000 mL | Freq: Every day | ORAL | Status: DC | PRN

## 2017-02-07 MED ORDER — SODIUM CHLORIDE 0.9% FLUSH: 3.0000 mL | INTRAVENOUS | Status: DC | PRN

## 2017-02-07 MED ORDER — ONDANSETRON HCL 4 MG PO TABS: 4.0000 mg | ORAL_TABLET | Freq: Four times a day (QID) | ORAL | Status: DC | PRN

## 2017-02-07 MED ORDER — AMIODARONE HCL 200 MG PO TABS
200.0000 mg | ORAL_TABLET | Freq: Every day | ORAL | Status: DC
  Administered 2017-02-08: 200 mg via ORAL
  Filled 2017-02-07: qty 1

## 2017-02-07 MED ORDER — DIAZEPAM 2 MG PO TABS: 2.0000 mg | ORAL_TABLET | Freq: Two times a day (BID) | ORAL | Status: DC | PRN

## 2017-02-07 MED ORDER — ENOXAPARIN SODIUM 40 MG/0.4ML ~~LOC~~ SOLN
40.0000 mg | SUBCUTANEOUS | Status: DC
  Administered 2017-02-07 – 2017-02-08 (×2): 40 mg via SUBCUTANEOUS
  Filled 2017-02-07 (×2): qty 0.4

## 2017-02-07 MED ORDER — MELATONIN 5 MG PO TABS
5.0000 mg | ORAL_TABLET | Freq: Every day | ORAL | Status: DC
  Administered 2017-02-07 – 2017-02-08 (×2): 5 mg via ORAL
  Filled 2017-02-07 (×3): qty 1

## 2017-02-07 MED ORDER — ONDANSETRON HCL 4 MG/2ML IJ SOLN: 4.0000 mg | Freq: Four times a day (QID) | INTRAMUSCULAR | Status: DC | PRN

## 2017-02-07 MED ORDER — ACETAMINOPHEN 650 MG RE SUPP: 650.0000 mg | Freq: Four times a day (QID) | RECTAL | Status: DC | PRN

## 2017-02-07 MED ORDER — SODIUM CHLORIDE 0.9 % IV SOLN: 250.0000 mL | INTRAVENOUS | Status: DC | PRN

## 2017-02-07 NOTE — ED Notes (Signed)
Pt cleaned and dried at this time, linen changed and clean at this time

## 2017-02-07 NOTE — ED Provider Notes (Signed)
[facility] Emergency Department Provider Note  ____________________________________________  Time seen: Approximately 10:53 AM  I have reviewed the triage vital signs and the nursing notes.   HISTORY  Chief Complaint Altered Mental Status  Level 5 caveat:  Portions of the history and physical were unable to be obtained due to AMS   HPI Anthony Blankenship is a 83 y.o. male with a history of Parkinson's disease,hypothyroidism, hypertension, coronary artery disease and presents for evaluation of altered mental status. According to patient's skilled nursing facility patient was less alert and talkative this morning. According to them patient usually speaks softly but this morning was not talking. Patient is keeping his eyes closed at this time. He asked me "not to scream" while I was talking to him, will squeezed both his hands but won't answer to any other questions, won't open his eyes  Past Medical History:  Diagnosis Date  . Anxiety   . Coronary artery disease   . Depression   . Hypertension   . Hypothyroidism   . Kidney stones   . Parkinson's disease (HCC)   . Restless leg syndrome     Patient Active Problem List   Diagnosis Date Noted  . Frailty 12/14/2016  . Orthostatic hypotension 12/14/2016  . Parkinson's disease (HCC)   . Palliative care by specialist   . Goals of care, counseling/discussion   . Gait instability 09/13/2016  . Recurrent falls 09/13/2016  . Coronary artery disease involving native coronary artery of native heart with angina pectoris (HCC) 09/08/2016  . Near syncope 06/12/2016  . Lower GI bleed 04/25/2016  . GI bleed 04/25/2016  . Restlessness 05/14/2015    Past Surgical History:  Procedure Laterality Date  . APPENDECTOMY    . CARDIAC CATHETERIZATION    . CORONARY ARTERY BYPASS GRAFT      Prior to Admission medications   Medication Sig Start Date End Date Taking? Authorizing Provider  acetaminophen (TYLENOL) 325  MG tablet Take 650 mg by mouth every 6 (six) hours as needed for mild pain.    Yes [provider]  carbidopa-levodopa-entacapone (STALEVO) 50-200-200 MG tablet Take 1 tablet by mouth 4 (four) times daily.   Yes [provider]  diazepam (VALIUM) 2 MG tablet Take 1 tablet (2 mg total) by mouth every 12 (twelve) hours as needed for anxiety. 09/16/16  Yes Delfino Lovett, MD  diclofenac sodium (VOLTAREN) 1 % GEL Apply 1 g topically 3 (three) times daily.   Yes [provider]  diphenhydrAMINE (BENADRYL) 25 MG tablet Take 25 mg by mouth every 6 (six) hours as needed.   Yes [provider]  guaifenesin (ROBITUSSIN) 100 MG/5ML syrup Take 300 mg by mouth 4 (four) times daily as needed for cough.   Yes [provider]  levothyroxine (SYNTHROID, LEVOTHROID) 75 MCG tablet Take 75 mcg by mouth daily.    Yes [provider]  loperamide (IMODIUM A-D) 2 MG tablet Take 2 mg by mouth as needed for diarrhea or loose stools. Do not exceed 8 doses in 24 hours   Yes [provider]  magnesium hydroxide (MILK OF MAGNESIA) 400 MG/5ML suspension Take 30 mLs by mouth daily as needed for mild constipation.   Yes [provider]  Melatonin 3 MG TABS Take 1 tablet by mouth at bedtime.   Yes [provider]  primidone (MYSOLINE) 50 MG tablet Take 50 mg by mouth at bedtime.   Yes [provider]  QUEtiapine (SEROQUEL) 25 MG tablet Take  25 mg by mouth at bedtime.   Yes [provider]  traZODone (DESYREL) 50 MG tablet Take 25 mg by mouth at bedtime.   Yes [provider]  Vitamin D, Ergocalciferol, (DRISDOL) 50000 units CAPS capsule Take 50,000 Units by mouth every 7 (seven) days.   Yes [provider]    Allergies Other  Family History  Problem Relation Age of Onset  . Liver disease Father   . Heart disease Mother   . Kidney disease Neg Hx   . Prostate cancer Neg Hx   . Bladder Cancer Neg Hx     Social  History Social History  Substance Use Topics  . Smoking status: Never Smoker  . Smokeless tobacco: Never Used  . Alcohol use No    Review of Systems Unable to obtain  Level 5 caveat:  Portions of the history and physical were unable to be obtained due to AMS and dementia  ____________________________________________   PHYSICAL EXAM:  VITAL SIGNS: ED Triage Vitals  Enc Vitals Group     BP 02/07/17 1046 122/69     Pulse Rate 02/07/17 1046 65     Resp 02/07/17 1046 16     Temp 02/07/17 1046 98.5 F (36.9 C)     Temp Source 02/07/17 1046 Oral     SpO2 02/07/17 1046 99 %     Weight 02/07/17 1049 160 lb (72.6 kg)     Height 02/07/17 1049 5\' 10"  (1.778 m)     Head Circumference --      Peak Flow --      Pain Score --      Pain Loc --      Pain Edu? --      Excl. in GC? --     Constitutional: Eyes close, will follow some commands, no distress HEENT:      Head: Normocephalic and atraumatic.         Eyes: Will not open eyes and resists when I try to open his eyes.       Mouth/Throat: Mucous membranes are dry.       Neck: Supple with no signs of meningismus. Cardiovascular: Regular rate and rhythm. No murmurs, gallops, or rubs. 2+ symmetrical distal pulses are present in all extremities. No JVD. Respiratory: Normal respiratory effort. Lungs are clear to auscultation bilaterally. No wheezes, crackles, or rhonchi.  Gastrointestinal: Soft, non tender, and non distended with positive bowel sounds. No rebound or guarding. Musculoskeletal: No edema, cyanosis, or erythema of extremities. Neurologic: Won't open his eyes, follows some commands, moving all 4 extremities, face is symmetric. Skin: Skin is warm, dry and intact. No rash noted.  ____________________________________________   LABS (all labs ordered are listed, but only abnormal results are displayed)  Labs Reviewed  CBC WITH DIFFERENTIAL/PLATELET - Abnormal; Notable for the following:       Result Value   RBC 4.18 (*)     HCT 39.0 (*)    Platelets 122 (*)    All other components within normal limits  BASIC METABOLIC PANEL - Abnormal; Notable for the following:    Glucose, Bld 111 (*)    BUN 27 (*)    All other components within normal limits  URINALYSIS, COMPLETE (UACMP) WITH MICROSCOPIC - Abnormal; Notable for the following:    Color, Urine YELLOW (*)    APPearance CLEAR (*)    Leukocytes, UA TRACE (*)    Squamous Epithelial / LPF 0-5 (*)    All other components within normal  limits  TROPONIN I  TSH  T4, FREE  MAGNESIUM  URINE DRUG SCREEN, QUALITATIVE ([facility] ONLY)   ____________________________________________  EKG  ED ECG REPORT I, Nita Sicklearolina Jakaila Norment, the attending physician, personally viewed and interpreted this ECG.  Sinus bradycardia, rate of 57, RBBB, normal QTc, normal axis, no ST elevations or depressions. No significant changes when compared to prior from July 2018 ____________________________________________  RADIOLOGY  Head CT: negative  ____________________________________________   PROCEDURES  Procedure(s) performed: None Procedures Critical Care performed:  Yes  CRITICAL CARE Performed by: Nita Sicklearolina Tamitha Norell  ?  Total critical care time: 35 min  Critical care time was exclusive of separately billable procedures and treating other patients.  Critical care was necessary to treat or prevent imminent or life-threatening deterioration.  Critical care was time spent personally by me on the following activities: development of treatment plan with patient and/or surrogate as well as nursing, discussions with consultants, evaluation of patient's response to treatment, examination of patient, obtaining history from patient or surrogate, ordering and performing treatments and interventions, ordering and review of laboratory studies, ordering and review of radiographic studies, pulse oximetry and re-evaluation of patient's  condition.  ____________________________________________   INITIAL IMPRESSION / ASSESSMENT AND PLAN / ED COURSE  83 y.o. male with a history of Parkinson's disease,hypothyroidism, hypertension, coronary artery disease and presents for evaluation of altered mental status since this morning. Patient states is symmetric, moving all extremities to command, not answering questions at this time. We'll get a head CT to rule out intracranial hemorrhage. We'll check basic blood work to rule out dehydration or electrolyte abnormalities. We'll check urinalysis to rule out an infection. Will get EKG and troponin to rule out ischemia    _________________________ 12:41 PM on 02/07/2017 ----------------------------------------- Labs, head CT, urinalysis with no acute findings. Patient has had several runs of V. Tach documented on telemetry. Normal electrolytes. Patient was given by mouth amiodarone. We'll admit to the hospitalist service.   Pertinent labs & imaging results that were available during my care of the patient were reviewed by me and considered in my medical decision making (see chart for details).    ____________________________________________   FINAL CLINICAL IMPRESSION(S) / ED DIAGNOSES  Final diagnoses:  Altered mental status, unspecified altered mental status type  V-tach [facility](HCC)      NEW MEDICATIONS STARTED DURING THIS VISIT:  New Prescriptions   No medications on file     Note:  This document was prepared using Dragon voice recognition software and may include unintentional dictation errors.    Nita SickleVeronese, [place], MD 02/07/17 [phone]

## 2017-02-07 NOTE — H&P (Signed)
[facility] - [place] at [facility]   PATIENT NAME: Anthony Blankenship    MR#:  [id number]  DATE OF BIRTH:  January 02, 1925  DATE OF ADMISSION:  02/07/2017  PRIMARY CARE PHYSICIAN: Royetta Asal, MD   REQUESTING/REFERRING PHYSICIAN:   CHIEF COMPLAINT:   Chief Complaint  Patient presents with  . Altered Mental Status    HISTORY OF PRESENT ILLNESS: Anthony Blankenship  is a 83 y.o. male with a known history of Coronary artery disease, hypertension, hypothyroidism, Parkinson's disease, restless leg syndrome presented to the emergency room with the confusion. He has history of dementia and Parkinson's disease has resting tremor. Patient was evaluated in the emergency room had runs of ventricular tachycardia. Patient is a poor historian. Not much history could be obtained from the patient. No complaints of any chest pain. No history of any fall or head injury. Hospitalist service was consulted for further care. Patient follows up with [facility] cardiology as outpatient. Patient was worked up with CT head which showed no acute intracranial abnormality.  PAST MEDICAL HISTORY:   Past Medical History:  Diagnosis Date  . Anxiety   . Coronary artery disease   . Depression   . Hypertension   . Hypothyroidism   . Kidney stones   . Parkinson's disease (HCC)   . Restless leg syndrome     PAST SURGICAL HISTORY: Past Surgical History:  Procedure Laterality Date  . APPENDECTOMY    . CARDIAC CATHETERIZATION    . CORONARY ARTERY BYPASS GRAFT      SOCIAL HISTORY:  Social History  Substance Use Topics  . Smoking status: Never Smoker  . Smokeless tobacco: Never Used  . Alcohol use No    FAMILY HISTORY:  Family History  Problem Relation Age of Onset  . Liver disease Father   . Heart disease Mother   . Kidney disease Neg Hx   . Prostate cancer Neg Hx   . Bladder Cancer Neg Hx     DRUG ALLERGIES:  Allergies  Allergen Reactions  . Other Other (See Comments)    Uncoded  Allergy. Allergen: Sleeping Medications    REVIEW OF SYSTEMS:  Could not be obtained secondary to advanced Parkinson's disease and dementia.  MEDICATIONS AT HOME:  Prior to Admission medications   Medication Sig Start Date End Date Taking? Authorizing Provider  acetaminophen (TYLENOL) 325 MG tablet Take 650 mg by mouth every 6 (six) hours as needed for mild pain.    Yes [provider]  carbidopa-levodopa-entacapone (STALEVO) 50-200-200 MG tablet Take 1 tablet by mouth 4 (four) times daily.   Yes [provider]  diazepam (VALIUM) 2 MG tablet Take 1 tablet (2 mg total) by mouth every 12 (twelve) hours as needed for anxiety. 09/16/16  Yes Delfino Lovett, MD  diclofenac sodium (VOLTAREN) 1 % GEL Apply 1 g topically 3 (three) times daily.   Yes [provider]  diphenhydrAMINE (BENADRYL) 25 MG tablet Take 25 mg by mouth every 6 (six) hours as needed.   Yes [provider]  guaifenesin (ROBITUSSIN) 100 MG/5ML syrup Take 300 mg by mouth 4 (four) times daily as needed for cough.   Yes [provider]  levothyroxine (SYNTHROID, LEVOTHROID) 75 MCG tablet Take 75 mcg by mouth daily.    Yes [provider]  loperamide (IMODIUM A-D) 2 MG tablet Take 2 mg by mouth as needed for diarrhea or loose stools. Do not exceed 8 doses in 24 hours   Yes [provider]  magnesium hydroxide (MILK OF MAGNESIA) 400 MG/5ML suspension Take 30 mLs by mouth daily as needed for mild constipation.   Yes [provider]  Melatonin 3 MG TABS Take 1 tablet by mouth at bedtime.   Yes [provider]  primidone (MYSOLINE) 50 MG tablet Take 50 mg by mouth at bedtime.   Yes [provider]  QUEtiapine (SEROQUEL) 25 MG tablet Take 25 mg by mouth at bedtime.   Yes [provider]  traZODone (DESYREL) 50 MG tablet Take 25 mg by mouth at bedtime.   Yes [provider]  Vitamin D, Ergocalciferol, (DRISDOL) 50000 units CAPS capsule Take  50,000 Units by mouth every 7 (seven) days.   Yes [provider]      PHYSICAL EXAMINATION:   VITAL SIGNS: Blood pressure 122/69, pulse 64, temperature 98.5 F (36.9 C), temperature source Oral, resp. rate 18, height 5\' 10"  (1.778 m), weight 72.6 kg (160 lb), SpO2 100 %.  GENERAL:  83 y.o.-year-old patient lying in the bed with no acute distress.  EYES: Pupils equal, round, reactive to light and accommodation. No scleral icterus. Extraocular muscles intact.  HEENT: Head atraumatic, normocephalic. Oropharynx and nasopharynx clear.  NECK:  Supple, no jugular venous distention. No thyroid enlargement, no tenderness.  LUNGS: Normal breath sounds bilaterally, no wheezing, rales,rhonchi or crepitation. No use of accessory muscles of respiration.  CARDIOVASCULAR: S1, S2 normal. No murmurs, rubs, or gallops.  ABDOMEN: Soft, nontender, nondistended. Bowel sounds present. No organomegaly or mass.  EXTREMITIES: No pedal edema, cyanosis, or clubbing.  NEUROLOGIC: Cranial nerves II through XII are intact. Awake, not completely oriented to time, place and person. Moves all extremities. PSYCHIATRIC: could not be assessed SKIN: No obvious rash, lesion, or ulcer.   LABORATORY PANEL:   CBC  Recent Labs Lab 02/07/17 1051  WBC 7.5  HGB 13.5  HCT 39.0*  PLT 122*  MCV 93.4  MCH 32.3  MCHC 34.6  RDW 13.1  LYMPHSABS 1.2  MONOABS 0.4  EOSABS 0.1  BASOSABS 0.0   ------------------------------------------------------------------------------------------------------------------  Chemistries   Recent Labs Lab 02/07/17 1051  NA 138  K 4.3  CL 102  CO2 29  GLUCOSE 111*  BUN 27*  CREATININE 1.05  CALCIUM 9.2  MG 2.0   ------------------------------------------------------------------------------------------------------------------ estimated creatinine clearance is 48 mL/min (by C-G formula based on SCr of 1.05  mg/dL). ------------------------------------------------------------------------------------------------------------------  Recent Labs  02/07/17 1051  TSH 2.794     Coagulation profile No results for input(s): INR, PROTIME in the last 168 hours. ------------------------------------------------------------------------------------------------------------------- No results for input(s): DDIMER in the last 72 hours. -------------------------------------------------------------------------------------------------------------------  Cardiac Enzymes  Recent Labs Lab 02/07/17 1051  TROPONINI <0.03   ------------------------------------------------------------------------------------------------------------------ Invalid input(s): POCBNP  ---------------------------------------------------------------------------------------------------------------  Urinalysis    Component Value Date/Time   COLORURINE YELLOW (A) 02/07/2017 1051   APPEARANCEUR CLEAR (A) 02/07/2017 1051   APPEARANCEUR Clear 10/18/2014 0603   LABSPEC 1.020 02/07/2017 1051   LABSPEC 1.032 10/18/2014 0603   PHURINE 5.0 02/07/2017 1051   GLUCOSEU NEGATIVE 02/07/2017 1051   GLUCOSEU Negative 10/18/2014 0603   HGBUR NEGATIVE 02/07/2017 1051   BILIRUBINUR NEGATIVE 02/07/2017 1051   BILIRUBINUR Negative 10/18/2014 0603   KETONESUR NEGATIVE 02/07/2017 1051   PROTEINUR NEGATIVE 02/07/2017 1051   NITRITE NEGATIVE 02/07/2017 1051   LEUKOCYTESUR TRACE (A) 02/07/2017 1051   LEUKOCYTESUR Trace 10/18/2014 0603     RADIOLOGY: Ct Head Wo Contrast  Result Date: 02/07/2017 CLINICAL DATA:  Minimally responsive. Altered mental status of uncertain etiology. EXAM: CT HEAD WITHOUT CONTRAST  TECHNIQUE: Contiguous axial images were obtained from the base of the skull through the vertex without intravenous contrast. COMPARISON:  01/24/2017 FINDINGS: Brain: No evidence of acute infarction, hemorrhage, extra-axial collection,  ventriculomegaly, or mass effect. Generalized cerebral atrophy. Periventricular white matter low attenuation likely secondary to microangiopathy. Vascular: Cerebrovascular atherosclerotic calcifications are noted. Skull: Negative for fracture or focal lesion. Sinuses/Orbits: Visualized portions of the orbits are unremarkable. Visualized portions of the paranasal sinuses and mastoid air cells are unremarkable. Other: None. IMPRESSION: 1. No acute intracranial pathology. 2. Chronic microvascular disease and cerebral atrophy. Electronically Signed   By: Elige Ko   On: 02/07/2017 11:20    EKG: Orders placed or performed during the hospital encounter of 02/07/17  . EKG 12-Lead  . EKG 12-Lead  . EKG 12-Lead  . EKG 12-Lead    IMPRESSION AND PLAN: 83 year old elderly male patient with history of coronary artery disease, hypertension, Parkinson's disease, restless leg syndrome, hypothyroidism presented to the emergency room with confusion. Admitting diagnosis 1. Ventricular arrhythmia 2. Parkinson's disease with behavioral problem 3. Hypertension 4. Hypothyroidism Treatment plan Admit patient to telemetry observation bed Patient was given oral amiodarone 1 dose in the emergency room Cardiology consultation Cycle troponin and check echocardiogram Monitor for any electrolyte disturbance  All the records are reviewed and case discussed with ED provider. Management plans discussed with the patient, family and they are in agreement.  CODE STATUS:DNR Code Status History    Date Active Date Inactive Code Status Order ID Comments User Context   09/13/2016  6:44 AM 09/16/2016  7:15 PM DNR [id number]  Ihor Austin, MD Inpatient   06/12/2016  2:55 AM 06/14/2016  8:30 PM DNR [id number]  Arnaldo Natal, MD Inpatient   04/25/2016  1:08 AM 04/25/2016  6:35 PM DNR [id number]  Tonye Royalty, DO Inpatient   04/25/2016  1:07 AM 04/25/2016  1:08 AM Full Code [id number]  Tonye Royalty, DO Inpatient    05/14/2015  7:05 PM 05/15/2015  4:51 PM DNR [id number]  Altamese Dilling, MD ED    Questions for Most Recent Historical Code Status (Order [id number])    Question Answer Comment   In the event of cardiac or respiratory ARREST Do not call a "code blue"    In the event of cardiac or respiratory ARREST Do not perform Intubation, CPR, defibrillation or ACLS    In the event of cardiac or respiratory ARREST Use medication by any route, position, wound care, and other measures to relive pain and suffering. May use oxygen, suction and manual treatment of airway obstruction as needed for comfort.        TOTAL TIME TAKING CARE OF THIS PATIENT: 50 minutes.    Ihor Austin M.D on 02/07/2017 at 1:32 PM  Between 7am to 6pm - Pager - [phone]  After 6pm go to [email or web address] - password [email or web address] [facility]  [place] [facility]  Office  [phone]  CC: Primary care physician; Royetta Asal, MD

## 2017-02-07 NOTE — Clinical Social Work Note (Signed)
CSW received consult that patient is from a facility.  Patient is from [facility] assisted [facility].  Ervin KnackEric R. Jayanth Szczesniak, MSW, Theresia MajorsLCSWA [phone]  02/07/2017 2:42 PM

## 2017-02-07 NOTE — ED Notes (Signed)
Pt refuses in and out cath, this RN placed urinal on pt. Will monitor for urine

## 2017-02-07 NOTE — Progress Notes (Signed)
   Asked to review rhythm strip from ER that was initially called VT.  Strip printed on 8/14 @ 11:58 in chart and reviewed showing sinus rhythm with baseline artifact.  Artifact in V1 gives the appearance of VT however QRS complexes are clearly seen in lead II with rates in the 50's.  Of note, pt does have a h/o Parkinson's and resting tremor.  Nicolasa Duckinghristopher Koya Hunger, NP

## 2017-02-07 NOTE — Care Management (Signed)
Placed in observation from [facility] assisted [facility] for ventricular arrhythmia. Review of rhythm strip by cardiology indicates baseline artifact with sinus rhythm.  CSW aware patient is from a facility

## 2017-02-07 NOTE — ED Triage Notes (Signed)
Patient presents to the ED via EMS from the [facility].  Patient is minimally responsive.  Staff [facility] felt that patient began to interact differently approx. 30 min ago-whereas he normally speaks quietly, now he is not speaking much at all.  Patient has his eyes closed during triage and does not open eyes on command.  Patient did squeeze the doctor's hand on command.  Patient requested a pillow during triage.  Patient does not have history of fall or dementia, per EMS.

## 2017-02-08 ENCOUNTER — Observation Stay (HOSPITAL_BASED_OUTPATIENT_CLINIC_OR_DEPARTMENT_OTHER)
Admit: 2017-02-08 | Discharge: 2017-02-08 | Disposition: A | Discharge status: Home / Self Care | Payer: Medicare Other | Attending: Internal Medicine | Admitting: Internal Medicine

## 2017-02-08 DIAGNOSIS — I351 Nonrheumatic aortic (valve) insufficiency: Secondary | ICD-10-CM

## 2017-02-08 DIAGNOSIS — R4182 Altered mental status, unspecified: Secondary | ICD-10-CM

## 2017-02-08 DIAGNOSIS — I472 Ventricular tachycardia: Secondary | ICD-10-CM | POA: Diagnosis not present

## 2017-02-08 LAB — CBC
HEMATOCRIT: 36.3 % — AB (ref 40.0–52.0)
Hemoglobin: 12.7 g/dL — ABNORMAL LOW (ref 13.0–18.0)
MCH: 32.6 pg (ref 26.0–34.0)
MCHC: 34.9 g/dL (ref 32.0–36.0)
MCV: 93.3 fL (ref 80.0–100.0)
PLATELETS: 111 10*3/uL — AB (ref 150–440)
RBC: 3.89 MIL/uL — AB (ref 4.40–5.90)
RDW: 13.1 % (ref 11.5–14.5)
WBC: 5.4 10*3/uL (ref 3.8–10.6)

## 2017-02-08 LAB — BASIC METABOLIC PANEL
Anion gap: 6 (ref 5–15)
BUN: 35 mg/dL — ABNORMAL HIGH (ref 6–20)
CHLORIDE: 102 mmol/L (ref 101–111)
CO2: 30 mmol/L (ref 22–32)
Calcium: 9.1 mg/dL (ref 8.9–10.3)
Creatinine, Ser: 1.2 mg/dL (ref 0.61–1.24)
GFR calc non Af Amer: 51 mL/min — ABNORMAL LOW (ref >60)
GFR, EST AFRICAN AMERICAN: 60 mL/min — AB (ref >60)
Glucose, Bld: 101 mg/dL — ABNORMAL HIGH (ref 65–99)
POTASSIUM: 4.2 mmol/L (ref 3.5–5.1)
Sodium: 138 mmol/L (ref 135–145)

## 2017-02-08 LAB — TROPONIN I: Troponin I: <0.03 ng/mL (ref <0.03)

## 2017-02-08 MED ORDER — SERTRALINE HCL 50 MG PO TABS
50.0000 mg | ORAL_TABLET | Freq: Every day | ORAL | Status: DC
  Administered 2017-02-08 – 2017-02-09 (×2): 50 mg via ORAL
  Filled 2017-02-08 (×2): qty 1

## 2017-02-08 MED ORDER — ENTACAPONE 200 MG PO TABS
200.0000 mg | ORAL_TABLET | ORAL | Status: DC
  Administered 2017-02-08 – 2017-02-09 (×7): 200 mg via ORAL
  Filled 2017-02-08 (×11): qty 1

## 2017-02-08 MED ORDER — CARBIDOPA-LEVODOPA 25-100 MG PO TABS
2.0000 | ORAL_TABLET | ORAL | Status: DC
  Administered 2017-02-08 – 2017-02-09 (×7): 2 via ORAL
  Filled 2017-02-08 (×11): qty 2

## 2017-02-08 NOTE — Progress Notes (Signed)
[facility]icians - [place] at [facility]   PATIENT NAME: Anthony Blankenship    MR#:  [id number]  DATE OF BIRTH:  06/02/1925  SUBJECTIVE:  CHIEF COMPLAINT:    REVIEW OF SYSTEMS:  CONSTITUTIONAL: No fever, fatigue or weakness.  EYES: No blurred or double vision.  EARS, NOSE, AND THROAT: No tinnitus or ear pain.  RESPIRATORY: No cough, shortness of breath, wheezing or hemoptysis.  CARDIOVASCULAR: No chest pain, orthopnea, edema.  GASTROINTESTINAL: No nausea, vomiting, diarrhea or abdominal pain.  GENITOURINARY: No dysuria, hematuria.  ENDOCRINE: No polyuria, nocturia,  HEMATOLOGY: No anemia, easy bruising or bleeding SKIN: No rash or lesion. MUSCULOSKELETAL: No joint pain or arthritis.   NEUROLOGIC: No tingling, numbness, weakness.  PSYCHIATRY: No anxiety or depression.   DRUG ALLERGIES:   Allergies  Allergen Reactions  . Other Other (See Comments)    Uncoded Allergy. Allergen: Sleeping Medications    VITALS:  Blood pressure (!) 93/56, pulse (!) 54, temperature (!) 97.5 F (36.4 C), temperature source Oral, resp. rate 20, height 5\' 10"  (1.778 m), weight 72.6 kg (160 lb), SpO2 97 %.  PHYSICAL EXAMINATION:  GENERAL:  83 y.o.-year-old patient lying in the bed with no acute distress.  EYES: Pupils equal, round, reactive to light and accommodation. No scleral icterus. Extraocular muscles intact.  HEENT: Head atraumatic, normocephalic. Oropharynx and nasopharynx clear.  NECK:  Supple, no jugular venous distention. No thyroid enlargement, no tenderness.  LUNGS: Normal breath sounds bilaterally, no wheezing, rales,rhonchi or crepitation. No use of accessory muscles of respiration.  CARDIOVASCULAR: S1, S2 normal. No murmurs, rubs, or gallops.  ABDOMEN: Soft, nontender, nondistended. Bowel sounds present. No organomegaly or mass.  EXTREMITIES: No pedal edema, cyanosis, or clubbing.  NEUROLOGIC: Cranial nerves II through XII are intact. Muscle strength 5/5 in all  extremities. Sensation intact. Gait not checked.  PSYCHIATRIC: The patient is alert and oriented x 3.  SKIN: No obvious rash, lesion, or ulcer.    LABORATORY PANEL:   CBC  Recent Labs Lab 02/08/17 0234  WBC 5.4  HGB 12.7*  HCT 36.3*  PLT 111*   ------------------------------------------------------------------------------------------------------------------  Chemistries   Recent Labs Lab 02/07/17 1051 02/08/17 0234  NA 138 138  K 4.3 4.2  CL 102 102  CO2 29 30  GLUCOSE 111* 101*  BUN 27* 35*  CREATININE 1.05 1.20  CALCIUM 9.2 9.1  MG 2.0  --    ------------------------------------------------------------------------------------------------------------------  Cardiac Enzymes  Recent Labs Lab 02/08/17 0234  TROPONINI <0.03   ------------------------------------------------------------------------------------------------------------------  RADIOLOGY:  Ct Head Wo Contrast  Result Date: 02/07/2017 CLINICAL DATA:  Minimally responsive. Altered mental status of uncertain etiology. EXAM: CT HEAD WITHOUT CONTRAST TECHNIQUE: Contiguous axial images were obtained from the base of the skull through the vertex without intravenous contrast. COMPARISON:  01/24/2017 FINDINGS: Brain: No evidence of acute infarction, hemorrhage, extra-axial collection, ventriculomegaly, or mass effect. Generalized cerebral atrophy. Periventricular white matter low attenuation likely secondary to microangiopathy. Vascular: Cerebrovascular atherosclerotic calcifications are noted. Skull: Negative for fracture or focal lesion. Sinuses/Orbits: Visualized portions of the orbits are unremarkable. Visualized portions of the paranasal sinuses and mastoid air cells are unremarkable. Other: None. IMPRESSION: 1. No acute intracranial pathology. 2. Chronic microvascular disease and cerebral atrophy. Electronically Signed   By: Elige KoHetal  Patel   On: 02/07/2017 11:20    EKG:   Orders placed or performed during the  hospital encounter of 02/07/17  . EKG 12-Lead  . EKG 12-Lead  . EKG 12-Lead  . EKG 12-Lead  ASSESSMENT AND PLAN:   83 year old elderly male patient with history of coronary artery disease, hypertension, Parkinson's disease, restless leg syndrome, hypothyroidism presented to the emergency room with confusion.   1. Ventricular arrhythmia Patient is asymptomatic, cardiology has reviewed the telemetry strip and noticed that this is an artifact Discontinue amiodarone Acute MI ruled out with negative troponins Echocardiogram -pending   2. Parkinson's disease with behavioral problem Resume home medication trazodone, Zoloft and Seroquel Continue primidone, Sinemet  3. Hypertension-blood pressure is soft not on any home medications continue close monitoring  4. Hypothyroidism-continue Synthyroid  Frequent falls PT evaluation    All the records are reviewed and case discussed with Care Management/Social Workerr. Management plans discussed with the patient's [phone] agreeable with the current plan.  CODE STATUS: DNR  TOTAL TIME TAKING CARE OF THIS PATIENT: 35 minutes.   POSSIBLE D/C IN AM  DAYS, DEPENDING ON CLINICAL CONDITION.  Note: This dictation was prepared with Dragon dictation along with smaller phrase technology. Any transcriptional errors that result from this process are unintentional.   Ramonita Lab M.D on 02/08/2017 at 3:01 PM  Between 7am to 6pm - Pager - [phone] After 6pm go to [email or web address] - password [email or web address] [facility]  [place] [facility]  Office  [phone]  CC: Primary care physician; Royetta Asal, MD

## 2017-02-08 NOTE — Evaluation (Signed)
Physical Therapy Evaluation Patient Details Name: Anthony Blankenship MRN: [id number] DOB: 1925-05-18 Today's Date: 02/08/2017   History of Present Illness  Patien tis a 83 y/o male with a history of Parkinson's that presents with increased confusion. [facility] a [facility] at [facility] [address].   Clinical Impression  Patient reports he has largely been wheelchair bound recently and has not been able to ambulate independently (even with RW) for some time. He requires assistance to complete all transfers, as well as cuing for proper hand placement and leaning anteriorly in transfers to facilitate quadriceps/upright posture. It appears this has largely been his recent baseline regarding mobility, he would benefit from additional home therapy to increase his independence with mobility.     Follow Up Recommendations Home health PT (provided his facility is able to assist with transfers)     Equipment Recommendations  Rolling walker with 5" wheels    Recommendations for Other Services       Precautions / Restrictions Precautions Precautions: Fall Restrictions Weight Bearing Restrictions: No      Mobility  Bed Mobility Overal bed mobility: Needs Assistance Bed Mobility: Supine to Sit     Supine to sit: Min assist;Mod assist     General bed mobility comments: Patient requires assistance to bring his LEs off the edge of the bed and elevate torso with assistance.   Transfers Overall transfer level: Needs assistance Equipment used: Rolling walker (2 wheeled) Transfers: Sit to/from Stand Sit to Stand: Mod assist         General transfer comment: Patient leans posteriorly through transfer but is able to provide enough assistance to come upright, requires cuing to push down through RW and lean anteriorly.   Ambulation/Gait Ambulation/Gait assistance: Min assist Ambulation Distance (Feet): 3 Feet Assistive device: Rolling walker (2 wheeled) Gait Pattern/deviations: Step-to pattern    Gait velocity interpretation: <1.8 ft/sec, indicative of risk for recurrent falls General Gait Details: Patient takes very small lateral steps with no buckling noted in LEs, appropriate use of UEs and RW to complete transfer with minimal assistance from therapist.   Stairs            Wheelchair Mobility    Modified Rankin (Stroke Patients Only)       Balance Overall balance assessment: Needs assistance Sitting-balance support: Bilateral upper extremity supported Sitting balance-Leahy Scale: Good     Standing balance support: Bilateral upper extremity supported Standing balance-Leahy Scale: Fair                               Pertinent Vitals/Pain Pain Assessment: No/denies pain    Home Living Family/patient expects to be discharged to:: Skilled nursing facility                      Prior Function Level of Independence: Needs assistance   Gait / Transfers Assistance Needed: Patient states he is no longer able to perform sit to stands or ambulate independently.   ADL's / Homemaking Assistance Needed: Assistance as needed from ALF staff        Hand Dominance   Dominant Hand: Right    Extremity/Trunk Assessment   Upper Extremity Assessment Upper Extremity Assessment: Generalized weakness    Lower Extremity Assessment Lower Extremity Assessment: Generalized weakness       Communication   Communication:  (Very soft spoken)  Cognition Arousal/Alertness: Awake/alert Behavior During Therapy: WFL for tasks assessed/performed Overall Cognitive Status:  History of cognitive impairments - at baseline                                 General Comments: He is able to communicate where he lives and previous life events, he is just very soft spoken.       General Comments      Exercises     Assessment/Plan    PT Assessment Patient needs continued PT services  PT Problem List Decreased strength;Decreased mobility;Decreased  activity tolerance;Decreased balance       PT Treatment Interventions DME instruction;Gait training;Therapeutic exercise;Therapeutic activities;Balance training;Stair training;Neuromuscular re-education;Wheelchair mobility training    PT Goals (Current goals can be found in the Care Plan section)  Acute Rehab PT Goals Patient Stated Goal: To return home PT Goal Formulation: With patient Time For Goal Achievement: 02/22/17 Potential to Achieve Goals: Good    Frequency Min 2X/week   Barriers to discharge        Co-evaluation               AM-PAC PT "6 Clicks" Daily Activity  Outcome Measure Difficulty turning over in bed (including adjusting bedclothes, sheets and blankets)?: A Little Difficulty moving from lying on back to sitting on the side of the bed? : A Little Difficulty sitting down on and standing up from a chair with arms (e.g., wheelchair, bedside commode, etc,.)?: A Lot Help needed moving to and from a bed to chair (including a wheelchair)?: A Little Help needed walking in hospital room?: A Lot Help needed climbing 3-5 steps with a railing? : Total 6 Click Score: 14    End of Session Equipment Utilized During Treatment: Gait belt Activity Tolerance: Patient tolerated treatment well Patient left: in chair;with chair alarm set;with call bell/phone within reach   PT Visit Diagnosis: Muscle weakness (generalized) (M62.81);Difficulty in walking, not elsewhere classified (R26.2)    Time: 1308-6578 PT Time Calculation (min) (ACUTE ONLY): 15 min   Charges:   PT Evaluation $PT Eval Moderate Complexity: 1 Mod     PT G Codes:   PT G-Codes **NOT FOR INPATIENT CLASS** Functional Assessment Tool Used: AM-PAC 6 Clicks Basic Mobility Functional Limitation: Mobility: Walking and moving around Mobility: Walking and Moving Around Current Status (I6962): At least 20 percent but less than 40 percent impaired, limited or restricted Mobility: Walking and Moving Around Goal  Status [phone]): At least 1 percent but less than 20 percent impaired, limited or restricted   Alva Garnet PT, DPT, CSCS    02/08/2017, 4:00 PM

## 2017-02-08 NOTE — Care Management Obs Status (Signed)
MEDICARE OBSERVATION STATUS NOTIFICATION   Patient Details  Name: Anthony Blankenship MRN: [id number] Date of Birth: [birth year]   Medicare Observation Status Notification Given:  Yes One Notice signed - patient made 'X" as he Is unable to sign his name due to hand tremors due to parkinsons. Provided patient with original and copy sent to HIM for scanning  Eber HongGreene, Areil Ottey R, RN 02/08/2017, 2:08 PM

## 2017-02-08 NOTE — Clinical Social Work Note (Signed)
CSW spoke to med tech at [facility], patient's base line for ambulation is sometimes he walks and pushes his wheelchair, other times he will sit in his wheelchair.  Med tech states that patient has had home health in the past, but has not had any recently.  Ervin KnackEric R. Jaleesa Cervi, MSW, Theresia MajorsLCSWA [phone]  02/08/2017 10:11 AM

## 2017-02-08 NOTE — Care Management (Signed)
Found that patient is currently followed by [facility]- RN PT OT and SLP. Will require order to resume.

## 2017-02-08 NOTE — Clinical Social Work Note (Signed)
Clinical Social Work Assessment  Patient Details  Name: Vennie Homansdward C Cowger MRN: [id number] Date of Birth: 1924-08-26  Date of referral:  02/08/17               Reason for consult:  Facility Placement                Permission sought to share information with:  Facility Medical sales representativeContact Representative, Family Supports Permission granted to share information::  Yes, Verbal Permission Granted  Name::     Livingston DionesStarling,[name] M Daughter [phone]   Agency::  ALF admissions  Relationship::     Contact Information:     Housing/Transportation Living arrangements for the past 2 months:  Assisted Living Facility Source of Information:    Patient Interpreter Needed:  None Criminal Activity/Legal Involvement Pertinent to Current Situation/Hospitalization:  No - Comment as needed Significant Relationships:  Adult Children Lives with:    Do you feel safe going back to the place where you live?    Need for family participation in patient care:     Care giving concerns:  Patient did not express any concerns about returning back to [facility].   Social Worker assessment / plan:  Patient is a 83 year old male who lives at [facility].  Patient is alert and oriented x4, patient, was explained role of CSW and process of trying to get him back to [facility].  Patient has been living at [facility] for a few years.  Patient plans to return back to [facility], patient does not have any questions or concerns.   Employment status:    Health and safety inspectornsurance information:  Medicare, [place], Self Pay (Medicaid Pending) PT Recommendations:  Home with Home Health Information / Referral to community resources:  Other (Comment Required) ([facility] is from [facility].)  Patient/Family's Response to care:  Patient in agreement to returning back to [facility].  Patient/Family's Understanding of and Emotional Response to Diagnosis, Current Treatment, and Prognosis:  Patient is looking forward to returning back to [facility] once he is  medically ready for discharge.  Emotional Assessment Appearance:  Appears stated age Attitude/Demeanor/Rapport:    Affect (typically observed):  Calm, Stable Orientation:  Oriented to Self, Oriented to  Time, Oriented to Situation, Oriented to Place Alcohol / Substance use:  Not Applicable Psych involvement (Current and /or in the community):  No (Comment)  Discharge Needs  Concerns to be addressed:  Care Coordination Readmission within the last 30 days:  No Current discharge risk:  None Barriers to Discharge:  Continued Medical Work up   [place] [employer], Laterica Matarazzo R, LCSWA 02/08/2017, 10:17 AM

## 2017-02-08 NOTE — Care Management (Signed)
Anticipate need for home health nursing and physical therapy when patient discharges back to [facility].  Agency preference is [facility].  Called referral.  Informed at baseline, patient's functional status can change on a daily basis- depending on his mood.  Some days he just sits and others he will push his wheelchair

## 2017-02-09 DIAGNOSIS — I472 Ventricular tachycardia: Secondary | ICD-10-CM | POA: Diagnosis not present

## 2017-02-09 LAB — ECHOCARDIOGRAM COMPLETE
HEIGHTINCHES: 70 in
WEIGHTICAEL: 2560 [oz_av]

## 2017-02-09 MED ORDER — SERTRALINE HCL 50 MG PO TABS: 50.0000 mg | ORAL_TABLET | Freq: Every day | ORAL | Status: AC

## 2017-02-09 MED ORDER — CARBIDOPA-LEVODOPA-ENTACAPONE 50-200-200 MG PO TABS: 1.0000 | ORAL_TABLET | Freq: Four times a day (QID) | ORAL | Status: DC

## 2017-02-09 MED ORDER — SENNOSIDES-DOCUSATE SODIUM 8.6-50 MG PO TABS
1.0000 | ORAL_TABLET | Freq: Every evening | ORAL | Status: AC | PRN
Start: 2017-02-09 — End: ?

## 2017-02-09 NOTE — Clinical Social Work Note (Signed)
Patient to be d/c'ed today to [facility].  Patient and family agreeable to plans will transport via ems RN to call report.  CSW contacted patient's daughter Anthony Blankenship and informed her that patient will be discharging today with home health.   Windell MouldingEric Shylyn Younce, MSW, Theresia MajorsLCSWA [phone]

## 2017-02-09 NOTE — Progress Notes (Signed)
IV and tele removed from patient. Report called to [facility]. EMS given report and transporting patient to facility.

## 2017-02-09 NOTE — Discharge Instructions (Signed)
Follow-up with primary care physician at the facility in 3-5 days Follow-up with [facility] cardiology as needed Continue home health  and ambulate with rolling walker

## 2017-02-09 NOTE — Discharge Summary (Signed)
[facility] [place] [facility]   PATIENT NAME: Anthony Blankenship    MR#:  [id number]  DATE OF BIRTH:  12/15/1924  DATE OF ADMISSION:  02/07/2017 ADMITTING PHYSICIAN: Ihor Austin, MD  DATE OF DISCHARGE:  02/09/17  PRIMARY CARE PHYSICIAN: Royetta Asal, MD    ADMISSION DIAGNOSIS:  V-tach (HCC) [I47.2] Altered mental status, unspecified altered mental status type [R41.82]  DISCHARGE DIAGNOSIS:  Active Problems:   Altered mental status Frequent falls Abnormal rhythm strip  SECONDARY DIAGNOSIS:   Past Medical History:  Diagnosis Date  . Anxiety   . Coronary artery disease   . Depression   . Hypertension   . Hypothyroidism   . Kidney stones   . Parkinson's disease (HCC)   . Restless leg syndrome     HOSPITAL COURSE:   HISTORY OF PRESENT ILLNESS: Anthony Blankenship  is a 83 y.o. male with a known history of Coronary artery disease, hypertension, hypothyroidism, Parkinson's disease, restless leg syndrome presented to the emergency room with the confusion. He has history of dementia and Parkinson's disease has resting tremor. Patient was evaluated in the emergency room had runs of ventricular tachycardia. Patient is a poor historian. Not much history could be obtained from the patient. No complaints of any chest pain. No history of any fall or head injury. Hospitalist service was consulted for further care. Patient [facility] with [facility] as outpatient. Patient was worked up with CT head which showed no acute intracranial abnormality.  Hospital course  1.Ventricular arrhythmia Patient is asymptomatic, cardiology has reviewed the telemetry strip and noticed that this is an artifact Discontinue amiodarone Acute MI ruled out with negative troponins Echocardiogram -normal ejection fraction 60-65%, no regional wall motion abnormalities, abnormal left ventricular relaxation with grade 1 diastolic dysfunction   2.Parkinson's disease with  behavioral problem Resume home medication trazodone, Zoloft and Seroquel Continue primidone, Sinemet  3.Hypertension-blood pressure is soft not on any home medications continue close monitoring  4.Hypothyroidism-continue Synthyroid  Frequent falls PT evaluation-recommended home health PT, rolling walker DISCHARGE CONDITIONS:   Fair  CONSULTS OBTAINED:     PROCEDURES None  DRUG ALLERGIES:   Allergies  Allergen Reactions  . Other Other (See Comments)    Uncoded Allergy. Allergen: Sleeping Medications    DISCHARGE MEDICATIONS:   Current Discharge Medication List    START taking these medications   Details  senna-docusate (SENOKOT-S) 8.6-50 MG tablet Take 1 tablet by mouth at bedtime as needed for mild constipation.    sertraline (ZOLOFT) 50 MG tablet Take 1 tablet (50 mg total) by mouth daily.      CONTINUE these medications which have NOT CHANGED   Details  acetaminophen (TYLENOL) 325 MG tablet Take 650 mg by mouth every 6 (six) hours as needed for mild pain.     carbidopa-levodopa-entacapone (STALEVO) 50-200-200 MG tablet Take 1 tablet by mouth See admin instructions. Take one tablet by mouth seven times daily    diazepam (VALIUM) 2 MG tablet Take 1 tablet (2 mg total) by mouth every 12 (twelve) hours as needed for anxiety. Qty: 10 tablet, Refills: 0    diclofenac sodium (VOLTAREN) 1 % GEL Apply 1 g topically 3 (three) times daily.    diphenhydrAMINE (BENADRYL) 25 MG tablet Take 25 mg by mouth every 6 (six) hours as needed.    guaifenesin (ROBITUSSIN) 100 MG/5ML syrup Take 300 mg by mouth 4 (four) times daily as needed for cough.    levothyroxine (SYNTHROID, LEVOTHROID) 75 MCG tablet Take  75 mcg by mouth daily.     loperamide (IMODIUM A-D) 2 MG tablet Take 2 mg by mouth as needed for diarrhea or loose stools. Do not exceed 8 doses in 24 hours    magnesium hydroxide (MILK OF MAGNESIA) 400 MG/5ML suspension Take 30 mLs by mouth daily as needed for mild  constipation.    Melatonin 3 MG TABS Take 1 tablet by mouth at bedtime.    primidone (MYSOLINE) 50 MG tablet Take 50 mg by mouth at bedtime.    QUEtiapine (SEROQUEL) 25 MG tablet Take 25 mg by mouth at bedtime.    traZODone (DESYREL) 50 MG tablet Take 25 mg by mouth at bedtime.    Vitamin D, Ergocalciferol, (DRISDOL) 50000 units CAPS capsule Take 50,000 Units by mouth every 7 (seven) days.         DISCHARGE INSTRUCTIONS:  Follow-up with primary care physician at the facility in 3-5 days [facility]ology as needed Continue home health  and ambulate with rolling walker   DIET:  Cardiac diet  DISCHARGE CONDITION:  Fair  ACTIVITY:  Activity as tolerated per PT   OXYGEN:  Home Oxygen: No.   Oxygen Delivery: room air  DISCHARGE LOCATION:  [facility]  If you experience worsening of your admission symptoms, develop shortness of breath, life threatening emergency, suicidal or homicidal thoughts you must seek medical attention immediately by calling 911 or calling your MD immediately  if symptoms less severe.  You Must read complete instructions/literature along with all the possible adverse reactions/side effects for all the Medicines you take and that have been prescribed to you. Take any new Medicines after you have completely understood and accpet all the possible adverse reactions/side effects.   Please note  You were cared for by a hospitalist during your hospital stay. If you have any questions about your discharge medications or the care you received while you were in the hospital after you are discharged, you can call the unit and asked to speak with the hospitalist on call if the hospitalist that took care of you is not available. Once you are discharged, your primary care physician will handle any further medical issues. Please note that NO REFILLS for any discharge medications will be authorized once you are discharged, as it is imperative  that you return to your primary care physician (or establish a relationship with a primary care physician if you do not have one) for your aftercare needs so that they can reassess your need for medications and monitor your lab values.     Today  Chief Complaint  Patient presents with  . Altered Mental Status   Patient is resting comfortably no other episodes of ventricular arrhythmias  ROS:  CONSTITUTIONAL: Denies fevers, chills. Denies any fatigue, weakness.  EYES: Denies blurry vision, double vision, eye pain. EARS, NOSE, THROAT: Denies tinnitus, ear pain, hearing loss. RESPIRATORY: Denies cough, wheeze, shortness of breath.  CARDIOVASCULAR: Denies chest pain, palpitations, edema.  GASTROINTESTINAL: Denies nausea, vomiting, diarrhea, abdominal pain. Denies bright red blood per rectum. GENITOURINARY: Denies dysuria, hematuria. ENDOCRINE: Denies nocturia or thyroid problems. HEMATOLOGIC AND LYMPHATIC: Denies easy bruising or bleeding. SKIN: Denies rash or lesion. MUSCULOSKELETAL: Denies pain in neck, back, shoulder, knees, hips or arthritic symptoms.  NEUROLOGIC: Denies paralysis, paresthesias.  PSYCHIATRIC: Denies anxiety or depressive symptoms.   VITAL SIGNS:  Blood pressure 132/73, pulse 70, temperature (!) 97.4 F (36.3 C), temperature source Oral, resp. rate 16, height 5\' 10"  (1.778 m), weight 72.6  kg (160 lb), SpO2 99 %.  I/O:    Intake/Output Summary (Last 24 hours) at 02/09/17 1321 Last data filed at 02/09/17 [id number]  Gross per 24 hour  Intake                0 ml  Output             1800 ml  Net            -1800 ml    PHYSICAL EXAMINATION:  GENERAL:  83 y.o.-year-old patient lying in the bed with no acute distress.  EYES: Pupils equal, round, reactive to light and accommodation. No scleral icterus. Extraocular muscles intact.  HEENT: Head atraumatic, normocephalic. Oropharynx and nasopharynx clear.  NECK:  Supple, no jugular venous distention. No thyroid  enlargement, no tenderness.  LUNGS: Normal breath sounds bilaterally, no wheezing, rales,rhonchi or crepitation. No use of accessory muscles of respiration.  CARDIOVASCULAR: S1, S2 normal. No murmurs, rubs, or gallops.  ABDOMEN: Soft, non-tender, non-distended. Bowel sounds present. No organomegaly or mass.  EXTREMITIES: No pedal edema, cyanosis, or clubbing.  NEUROLOGIC: Cranial nerves II through XII are intact. Muscle strength at his baseline in all extremities. Sensation intact. Gait not checked.  PSYCHIATRIC: The patient is alert and oriented x 3.  SKIN: No obvious rash, lesion, or ulcer.   DATA REVIEW:   CBC  Recent Labs Lab 02/08/17 0234  WBC 5.4  HGB 12.7*  HCT 36.3*  PLT 111*    Chemistries   Recent Labs Lab 02/07/17 1051 02/08/17 0234  NA 138 138  K 4.3 4.2  CL 102 102  CO2 29 30  GLUCOSE 111* 101*  BUN 27* 35*  CREATININE 1.05 1.20  CALCIUM 9.2 9.1  MG 2.0  --     Cardiac Enzymes  Recent Labs Lab 02/08/17 0234  TROPONINI <0.03    Microbiology Results  Results for orders placed or performed during the hospital encounter of 02/07/17  MRSA PCR Screening     Status: None   Collection Time: 02/07/17  2:46 PM  Result Value Ref Range Status   MRSA by PCR NEGATIVE NEGATIVE Final    Comment:        The GeneXpert MRSA Assay (FDA approved for NASAL specimens only), is one component of a comprehensive MRSA colonization surveillance program. It is not intended to diagnose MRSA infection nor to guide or monitor treatment for MRSA infections.     RADIOLOGY:  Ct Head Wo Contrast  Result Date: 02/07/2017 CLINICAL DATA:  Minimally responsive. Altered mental status of uncertain etiology. EXAM: CT HEAD WITHOUT CONTRAST TECHNIQUE: Contiguous axial images were obtained from the base of the skull through the vertex without intravenous contrast. COMPARISON:  01/24/2017 FINDINGS: Brain: No evidence of acute infarction, hemorrhage, extra-axial collection,  ventriculomegaly, or mass effect. Generalized cerebral atrophy. Periventricular white matter low attenuation likely secondary to microangiopathy. Vascular: Cerebrovascular atherosclerotic calcifications are noted. Skull: Negative for fracture or focal lesion. Sinuses/Orbits: Visualized portions of the orbits are unremarkable. Visualized portions of the paranasal sinuses and mastoid air cells are unremarkable. Other: None. IMPRESSION: 1. No acute intracranial pathology. 2. Chronic microvascular disease and cerebral atrophy. Electronically Signed   By: Elige KoHetal  Patel   On: 02/07/2017 11:20    EKG:   Orders placed or performed during the hospital encounter of 02/07/17  . EKG 12-Lead  . EKG 12-Lead  . EKG 12-Lead  . EKG 12-Lead      Management plans discussed with the patient, family and they are  in agreement.  CODE STATUS:     Code Status Orders        Start     Ordered   02/07/17 1425  Do not attempt resuscitation (DNR)  Continuous    Question Answer Comment  In the event of cardiac or respiratory ARREST Do not call a "code blue"   In the event of cardiac or respiratory ARREST Do not perform Intubation, CPR, defibrillation or ACLS   In the event of cardiac or respiratory ARREST Use medication by any route, position, wound care, and other measures to relive pain and suffering. May use oxygen, suction and manual treatment of airway obstruction as needed for comfort.      02/07/17 1424    Code Status History    Date Active Date Inactive Code Status Order ID Comments User Context   09/13/2016  6:44 AM 09/16/2016  7:15 PM DNR [id number]  Ihor Austin, MD Inpatient   06/12/2016  2:55 AM 06/14/2016  8:30 PM DNR [id number]  Arnaldo Natal, MD Inpatient   04/25/2016  1:08 AM 04/25/2016  6:35 PM DNR [id number]  Tonye Royalty, DO Inpatient   04/25/2016  1:07 AM 04/25/2016  1:08 AM Full Code [id number]  Hugelmeyer, Jon Gills, DO Inpatient   05/14/2015  7:05 PM 05/15/2015  4:51 PM DNR  [id number]  Altamese Dilling, MD ED    Advance Directive Documentation     Most Recent Value  Type of Advance Directive  Out of facility DNR (pink MOST or yellow form)  Pre-existing out of facility DNR order (yellow form or pink MOST form)  Pink MOST form placed in chart (order not valid for inpatient use)  "MOST" Form in Place?  -      TOTAL TIME TAKING CARE OF THIS PATIENT: 43  minutes.   Note: This dictation was prepared with Dragon dictation along with smaller phrase technology. Any transcriptional errors that result from this process are unintentional.   @M[facility] @  on 02/09/2017 at 1:21 PM  Between 7am to 6pm - Pager - [phone]  After 6pm go to [email or web address] - password [email or web address] [facility]  [facility]  Office  [phone]  CC: Primary care physician; Royetta Asal, MD

## 2017-02-09 NOTE — NC FL2 (Signed)
[place] MEDICAID FL2 LEVEL OF CARE SCREENING TOOL     IDENTIFICATION  Patient Name: Anthony Blankenship Birthdate: September 07, 1924 Sex: male Admission Date (Current Location): 02/07/2017  [place] and [place] Number:  [address] [id number] Q Facility and Address:  [facility], [address], [place], [place] [address]      Provider Number: [id number]  Attending Physician Name and Address:  Ramonita Lab, MD  Relative Name and Phone Number:  Livingston Diones Daughter [phone]     Current Level of Care: Hospital Recommended Level of Care: Assisted Living Facility Prior Approval Number:    Date Approved/Denied:   PASRR Number:    Discharge Plan: Other (Comment) ([facility])    Current Diagnoses: Patient Active Problem List   Diagnosis Date Noted  . Altered mental status 02/08/2017  . Frailty 12/14/2016  . Orthostatic hypotension 12/14/2016  . Parkinson's disease (HCC)   . Palliative care by specialist   . Goals of care, counseling/discussion   . Gait instability 09/13/2016  . Recurrent falls 09/13/2016  . Coronary artery disease involving native coronary artery of native heart with angina pectoris (HCC) 09/08/2016  . Near syncope 06/12/2016  . Lower GI bleed 04/25/2016  . GI bleed 04/25/2016  . Restlessness 05/14/2015    Orientation RESPIRATION BLADDER Height & Weight     Self, Time, Situation, Place  Normal Incontinent Weight: 160 lb (72.6 kg) Height:  5\' 10"  (177.8 cm)  BEHAVIORAL SYMPTOMS/MOOD NEUROLOGICAL BOWEL NUTRITION STATUS      Continent Diet (Low sodium diet)  AMBULATORY STATUS COMMUNICATION OF NEEDS Skin   Limited Assist Verbally Normal                       Personal Care Assistance Level of Assistance  Dressing, Feeding, Bathing Bathing Assistance: Limited assistance Feeding assistance: Limited assistance Dressing Assistance: Limited assistance     Functional Limitations Info  Sight, Hearing, Speech Sight Info:  Adequate Hearing Info: Adequate Speech Info: Adequate    SPECIAL CARE FACTORS FREQUENCY  PT (By licensed PT)     PT Frequency: Home Health PT at least 2x a week              Contractures Contractures Info: Not present    Additional Factors Info  Code Status, Allergies Code Status Info: DNR Allergies Info: Uncoded Allergy. Allergen: Sleeping Medications           Current Medications (02/09/2017):  This is the current hospital active medication list Current Facility-Administered Medications  Medication Dose Route Frequency Provider Last Rate Last Dose  . 0.9 %  sodium chloride infusion  250 mL Intravenous PRN Pyreddy, Vivien Rota, MD      . acetaminophen (TYLENOL) tablet 650 mg  650 mg Oral Q6H PRN Pyreddy, Vivien Rota, MD       Or  . acetaminophen (TYLENOL) suppository 650 mg  650 mg Rectal Q6H PRN Pyreddy, Vivien Rota, MD      . aspirin EC tablet 81 mg  81 mg Oral Daily Pyreddy, Vivien Rota, MD   81 mg at 02/09/17 0927  . carbidopa-levodopa (SINEMET IR) 25-100 MG per tablet immediate release 2 tablet  2 tablet Oral 7 times per day Inza Mikrut, Deanna Artis, MD   2 tablet at 02/09/17 1124   And  . entacapone (COMTAN) tablet 200 mg  200 mg Oral 7 times per day Ramonita Lab, MD   200 mg at 02/09/17 1124  . diazepam (VALIUM) tablet 2 mg  2 mg Oral Q12H PRN Pyreddy, Pavan,  MD      . enoxaparin (LOVENOX) injection 40 mg  40 mg Subcutaneous Q24H Ihor Austin, MD   40 mg at 02/08/17 2136  . levothyroxine (SYNTHROID, LEVOTHROID) tablet 75 mcg  75 mcg Oral QAC breakfast Ihor Austin, MD   75 mcg at 02/09/17 0927  . magnesium hydroxide (MILK OF MAGNESIA) suspension 30 mL  30 mL Oral Daily PRN Ihor Austin, MD      . Melatonin TABS 5 mg  5 mg Oral QHS Pyreddy, Vivien Rota, MD   5 mg at 02/08/17 2137  . ondansetron (ZOFRAN) tablet 4 mg  4 mg Oral Q6H PRN Pyreddy, Vivien Rota, MD       Or  . ondansetron (ZOFRAN) injection 4 mg  4 mg Intravenous Q6H PRN Pyreddy, Pavan, MD      . primidone (MYSOLINE) tablet 50 mg  50 mg Oral QHS  Ihor Austin, MD   50 mg at 02/08/17 2137  . QUEtiapine (SEROQUEL) tablet 25 mg  25 mg Oral QHS Ihor Austin, MD   25 mg at 02/08/17 2137  . senna-docusate (Senokot-S) tablet 1 tablet  1 tablet Oral QHS PRN Ihor Austin, MD      . sertraline (ZOLOFT) tablet 50 mg  50 mg Oral Daily Coley Littles, MD   50 mg at 02/09/17 0927  . sodium chloride flush (NS) 0.9 % injection 3 mL  3 mL Intravenous Q12H Ihor Austin, MD   3 mL at 02/09/17 0928  . sodium chloride flush (NS) 0.9 % injection 3 mL  3 mL Intravenous PRN Pyreddy, Vivien Rota, MD      . traZODone (DESYREL) tablet 25 mg  25 mg Oral QHS Ihor Austin, MD   25 mg at 02/08/17 2137     Discharge Medications: Please see discharge summary for a list of discharge medications.  START taking these medications   Details  senna-docusate (SENOKOT-S) 8.6-50 MG tablet Take 1 tablet by mouth at bedtime as needed for mild constipation.    sertraline (ZOLOFT) 50 MG tablet Take 1 tablet (50 mg total) by mouth daily.         CONTINUE these medications which have NOT CHANGED   Details  acetaminophen (TYLENOL) 325 MG tablet Take 650 mg by mouth every 6 (six) hours as needed for mild pain.     carbidopa-levodopa-entacapone (STALEVO) 50-200-200 MG tablet Take 1 tablet by mouth See admin instructions. Take one tablet by mouth seven times daily    diazepam (VALIUM) 2 MG tablet Take 1 tablet (2 mg total) by mouth every 12 (twelve) hours as needed for anxiety. Qty: 10 tablet, Refills: 0    diclofenac sodium (VOLTAREN) 1 % GEL Apply 1 g topically 3 (three) times daily.    diphenhydrAMINE (BENADRYL) 25 MG tablet Take 25 mg by mouth every 6 (six) hours as needed.    guaifenesin (ROBITUSSIN) 100 MG/5ML syrup Take 300 mg by mouth 4 (four) times daily as needed for cough.    levothyroxine (SYNTHROID, LEVOTHROID) 75 MCG tablet Take 75 mcg by mouth daily.     loperamide (IMODIUM A-D) 2 MG tablet Take 2 mg by mouth as needed for diarrhea or loose  stools. Do not exceed 8 doses in 24 hours    magnesium hydroxide (MILK OF MAGNESIA) 400 MG/5ML suspension Take 30 mLs by mouth daily as needed for mild constipation.    Melatonin 3 MG TABS Take 1 tablet by mouth at bedtime.    primidone (MYSOLINE) 50 MG tablet Take 50 mg by mouth at  bedtime.    QUEtiapine (SEROQUEL) 25 MG tablet Take 25 mg by mouth at bedtime.    traZODone (DESYREL) 50 MG tablet Take 25 mg by mouth at bedtime.    Vitamin D, Ergocalciferol, (DRISDOL) 50000 units CAPS capsule Take 50,000 Units by mouth every 7 (seven) days.         Relevant Imaging Results:  Relevant Lab Results:   Additional Information SSN [id number]  Darleene Cleavernterhaus, Eric R, [place]WA

## 2017-02-09 NOTE — Progress Notes (Signed)
Physical Therapy Treatment Patient Details Name: Anthony Blankenship MRN: [id number] DOB: 10/14/1924 Today's Date: 02/09/2017    History of Present Illness Patien tis a 83 y/o male with a history of Parkinson's that presents with increased confusion. He is a resident at the [address]     PT Comments    Pt agreeable to PT; denies any pain. Per chart review, pt is a non ambulator. Pt is in chair. Pt participates with limited long sit/seated exercises with assist throughout. Pt responds well to tactile cues. Continue PT to improve strength/stability, so that pt may improve assist with transfers.   Follow Up Recommendations  Home health PT     Equipment Recommendations  Rolling walker with 5" wheels    Recommendations for Other Services       Precautions / Restrictions Restrictions Weight Bearing Restrictions: No    Mobility  Bed Mobility               General bed mobility comments: Not tested; up in chair  Transfers                 General transfer comment: Not tested; pt non ambulatory and already in chair  Ambulation/Gait             General Gait Details: Non ambulatory   Stairs            Wheelchair Mobility    Modified Rankin (Stroke Patients Only)       Balance                                            Cognition Arousal/Alertness: Awake/alert Behavior During Therapy: WFL for tasks assessed/performed Overall Cognitive Status: History of cognitive impairments - at baseline                                 General Comments:  (whisper like speech. Does answer questions)      Exercises General Exercises - Lower Extremity Ankle Circles/Pumps: AAROM;Both;10 reps Long Arc Quad: AAROM;Both;10 reps;Seated Heel Slides: AAROM;Both;10 reps Hip ABduction/ADduction: AAROM;Both;10 reps Hip Flexion/Marching: AAROM;Both;10 reps;Seated    General Comments        Pertinent Vitals/Pain Pain  Assessment: No/denies pain    Home Living                      Prior Function            PT Goals (current goals can now be found in the care plan section)      Frequency    Min 2X/week      PT Plan Current plan remains appropriate    Co-evaluation              AM-PAC PT "6 Clicks" Daily Activity  Outcome Measure  Difficulty turning over in bed (including adjusting bedclothes, sheets and blankets)?: Total Difficulty moving from lying on back to sitting on the side of the bed? : Total Difficulty sitting down on and standing up from a chair with arms (e.g., wheelchair, bedside commode, etc,.)?: Total Help needed moving to and from a bed to chair (including a wheelchair)?: A Little Help needed walking in hospital room?: Total Help needed climbing 3-5 steps with a railing? : Total 6 Click Score: 8    End of  Session         PT Visit Diagnosis: Muscle weakness (generalized) (M62.81);Difficulty in walking, not elsewhere classified (R26.2)     Time: [id number] PT Time Calculation (min) (ACUTE ONLY): 20 min  Charges:  $Therapeutic Exercise: 8-22 mins                    G Codes:        Scot DockHeidi E Brihana Quickel, PTA 02/09/2017, 1:27 PM

## 2017-02-23 ENCOUNTER — Emergency Department: Payer: Medicare Other

## 2017-02-23 ENCOUNTER — Emergency Department
Admission: EM | Admit: 2017-02-23 | Discharge: 2017-02-23 | Disposition: A | Discharge status: Home / Self Care | Payer: Medicare Other | Attending: Emergency Medicine | Admitting: Emergency Medicine

## 2017-02-23 ENCOUNTER — Encounter: Payer: Self-pay | Admitting: *Deleted

## 2017-02-23 DIAGNOSIS — E039 Hypothyroidism, unspecified: Secondary | ICD-10-CM | POA: Insufficient documentation

## 2017-02-23 DIAGNOSIS — I25729 Atherosclerosis of autologous artery coronary artery bypass graft(s) with unspecified angina pectoris: Secondary | ICD-10-CM | POA: Diagnosis not present

## 2017-02-23 DIAGNOSIS — G2581 Restless legs syndrome: Secondary | ICD-10-CM | POA: Insufficient documentation

## 2017-02-23 DIAGNOSIS — R531 Weakness: Secondary | ICD-10-CM

## 2017-02-23 DIAGNOSIS — I10 Essential (primary) hypertension: Secondary | ICD-10-CM | POA: Diagnosis not present

## 2017-02-23 DIAGNOSIS — G20 Parkinson's disease: Secondary | ICD-10-CM | POA: Insufficient documentation

## 2017-02-23 NOTE — ED Provider Notes (Signed)
[facility] Emergency Department Provider Note  ____________________________________________   First MD Initiated Contact with Patient 02/23/17 1351     (approximate)  I have reviewed the triage vital signs and the nursing notes.   HISTORY  Chief Complaint Weakness   HPI Anthony Blankenship is a 83 y.o. male with a history of Parkinson's disease who is presenting to the emergency department today after being found crawling in the hallway of his residence. Per the patient's roommate, who EMS says does not have dementia, the patient crawled out of his bed and into the hallway. The patient also agrees with this story. He is denying any pain, weakness or any other complaint. However, he has unable to express what exactly happened earlier today and I need to ask him yes or no questions on her to obtain history. According to his daughter, Anthony Blankenship, the patient has had recent falls lately. She says that he has lost his walker and has needed to push a wheelchair in order to walk lately.   Past Medical History:  Diagnosis Date  . Anxiety   . Coronary artery disease   . Depression   . Hypertension   . Hypothyroidism   . Kidney stones   . Parkinson's disease (HCC)   . Restless leg syndrome     Patient Active Problem List   Diagnosis Date Noted  . Altered mental status 02/08/2017  . Frailty 12/14/2016  . Orthostatic hypotension 12/14/2016  . Parkinson's disease (HCC)   . Palliative care by specialist   . Goals of care, counseling/discussion   . Gait instability 09/13/2016  . Recurrent falls 09/13/2016  . Coronary artery disease involving native coronary artery of native heart with angina pectoris (HCC) 09/08/2016  . Near syncope 06/12/2016  . Lower GI bleed 04/25/2016  . GI bleed 04/25/2016  . Restlessness 05/14/2015    Past Surgical History:  Procedure Laterality Date  . APPENDECTOMY    . CARDIAC CATHETERIZATION    . CORONARY ARTERY BYPASS  GRAFT      Prior to Admission medications   Medication Sig Start Date End Date Taking? Authorizing Provider  acetaminophen (TYLENOL) 325 MG tablet Take 650 mg by mouth every 6 (six) hours as needed for mild pain.    Yes [provider]  carbidopa-levodopa-entacapone (STALEVO) 50-200-200 MG tablet Take 1 tablet by mouth See admin instructions. Take one tablet by mouth seven times daily   Yes [provider]  diazepam (VALIUM) 2 MG tablet Take 1 tablet (2 mg total) by mouth every 12 (twelve) hours as needed for anxiety. 09/16/16  Yes Delfino Lovett, MD  diclofenac sodium (VOLTAREN) 1 % GEL Apply 1 g topically 3 (three) times daily.   Yes [provider]  diphenhydrAMINE (BENADRYL) 25 MG tablet Take 25 mg by mouth every 6 (six) hours as needed.   Yes [provider]  guaifenesin (ROBITUSSIN) 100 MG/5ML syrup Take 300 mg by mouth 4 (four) times daily as needed for cough.   Yes [provider]  levothyroxine (SYNTHROID, LEVOTHROID) 75 MCG tablet Take 75 mcg by mouth daily.    Yes [provider]  lidocaine (ASPERCREME W/LIDOCAINE) 4 % cream Apply 1 application topically 3 (three) times daily as needed.   Yes [provider]  loperamide (IMODIUM A-D) 2 MG tablet Take 2 mg by mouth as needed for diarrhea or loose stools. Do not exceed 8 doses in 24 hours   Yes [provider]  magnesium hydroxide (MILK OF  MAGNESIA) 400 MG/5ML suspension Take 30 mLs by mouth daily as needed for mild constipation.   Yes [provider]  Melatonin 3 MG TABS Take 1 tablet by mouth at bedtime.   Yes [provider]  primidone (MYSOLINE) 50 MG tablet Take 50 mg by mouth at bedtime.   Yes [provider]  senna-docusate (SENOKOT-S) 8.6-50 MG tablet Take 1 tablet by mouth at bedtime as needed for mild constipation. 02/09/17  Yes Gouru, Deanna ArtisAruna, MD  sertraline (ZOLOFT) 50 MG tablet Take 1 tablet (50 mg total) by mouth daily. 02/10/17  Yes  Gouru, Deanna ArtisAruna, MD  traZODone (DESYREL) 50 MG tablet Take 50 mg by mouth at bedtime.    Yes [provider]  Vitamin D, Ergocalciferol, (DRISDOL) 50000 units CAPS capsule Take 50,000 Units by mouth every 7 (seven) days.   Yes [provider]    Allergies Other  Family History  Problem Relation Age of Onset  . Liver disease Father   . Heart disease Mother   . Kidney disease Neg Hx   . Prostate cancer Neg Hx   . Bladder Cancer Neg Hx     Social History Social History  Substance Use Topics  . Smoking status: Never Smoker  . Smokeless tobacco: Never Used  . Alcohol use No    Review of Systems  Constitutional: No fever/chills Eyes: No visual changes. ENT: No sore throat. Cardiovascular: Denies chest pain. Respiratory: Denies shortness of breath. Gastrointestinal: No abdominal pain.  No nausea, no vomiting.  No diarrhea.  No constipation. Genitourinary: Negative for dysuria. Musculoskeletal: Negative for back pain. Skin: Negative for rash. Neurological: Negative for headaches, focal weakness or numbness.   ____________________________________________   PHYSICAL EXAM:  VITAL SIGNS: ED Triage Vitals [02/23/17 1347]  Enc Vitals Group     BP 94/67     Pulse Rate 65     Resp 18     Temp 97.8 F (36.6 C)     Temp Source Oral     SpO2 100 %     Weight 160 lb (72.6 kg)     Height 5\' 10"  (1.778 m)     Head Circumference      Peak Flow      Pain Score      Pain Loc      Pain Edu?      Excl. in GC?     Constitutional: Alert and oriented.  in no acute distress.Resting tremor likely secondary to his Parkinson's. Eyes: Conjunctivae are normal.  Head: Atraumatic. Nose: No congestion/rhinnorhea. Mouth/Throat: Mucous membranes are moist.  Neck: No stridor.   Cardiovascular: Normal rate, regular rhythm. Grossly normal heart sounds.  Respiratory: Normal respiratory effort.  No retractions. Lungs CTAB. Gastrointestinal: Soft and nontender. No distention.  No CVA tenderness. Musculoskeletal: No lower extremity tenderness nor edema.  No joint effusions. Neurologic:  Normal speech and language. No gross focal neurologic deficits are appreciated. Skin:  Skin is warm, dry and intact. No rash noted. Psychiatric: Mood and affect are normal. Speech and behavior are normal.  ____________________________________________   LABS (all labs ordered are listed, but only abnormal results are displayed)  Labs Reviewed - No data to display ____________________________________________  EKG  ED ECG REPORT I, Quinterious Walraven,  Teena Iraniavid M, the attending physician, personally viewed and interpreted this ECG.   Date: 02/23/2017  EKG Time: 1350  Rate: 60  Rhythm: normal sinus rhythm  Axis: Normal  Intervals:right bundle branch block and left posterior fascicular block  ST&T Change: No  ST segment elevation or depression. No abnormal T-wave inversion.  EKG is confounded by the patient's tremor.  ____________________________________________  RADIOLOGY  Chest x-ray without any active disease. Pelvic x-ray without any active disease. No acute intracranial abnormality on the CT of the brain. ____________________________________________   PROCEDURES  Procedure(s) performed:  Procedures  Critical Care performed:   ____________________________________________   INITIAL IMPRESSION / ASSESSMENT AND PLAN / ED COURSE  Pertinent labs & imaging results that were available during my care of the patient were reviewed by me and considered in my medical decision making (see chart for details).  ----------------------------------------- 2:10 PM on 02/23/2017 ----------------------------------------- I discussed the case with the patient's daughter who would like the patient to have only imaging as of now. We will reassess the patient and make sure he can ambulate after his imaging is completed as long as the imaging does not show any acute process.       ----------------------------------------- 3:42 PM on 02/23/2017 ----------------------------------------- Patient with reassuring imaging. Able to stand with assistance and take 1-2 steps. I discussed the case again with the patient's daughter. She says that this is below his baseline. She says that the patient is able to make his own medical decisions and that she ask him about further workup.. I then asked the patient if you prefer for [place] to do further blood work and urine studies that he does not want this at this time. He says that he just wants to go back to the [facility] where he is a resident. I think this is a reasonable plan given the patient's advanced Parkinson's disease and age doing more minimalist approach at this time. The patient is a DO NOT RESUSCITATE. He will return for any worsening or concerning symptoms. Unclear if the patient actually fell. The patient's daughter reports hearing from the [place] that he did have a fall. However, the patient denies this and also his roommate denies this to EMS.  ____________________________________________   FINAL CLINICAL IMPRESSION(S) / ED DIAGNOSES  Weakness.     NEW MEDICATIONS STARTED DURING THIS VISIT:  New Prescriptions   No medications on file     Note:  This document was prepared using Dragon voice recognition software and may include unintentional dictation errors.     Myrna Blazer, MD 02/23/17 [phone]

## 2017-02-23 NOTE — ED Notes (Addendum)
Attempted to walk pt, pt able to bear weight but will not takes steps despite coaching and assistance, awake and alert given a chocolate milkshake when he was back in bed, pt states "I want to go home"

## 2017-02-23 NOTE — ED Notes (Signed)
Pt given some chocolate milk per pt request and per Dr. Raynelle CharySchavitz

## 2017-02-23 NOTE — ED Notes (Signed)
Pt unable to sign no discharge, report given to Alinda Moneyony, pt discharged with the [facility] transport service

## 2017-02-23 NOTE — ED Notes (Signed)
Report given to Alinda Moneyony at the [facility], states she is trying to arrange transport and will call back

## 2017-02-23 NOTE — ED Triage Notes (Signed)
Pt arrives via EMS from the [facility], staff states pt fell but per EMS and pt states he crawled into the hallway and then got tired, upon arrival pt tremoring and states "I am tired", EDP at bedside

## 2017-03-12 ENCOUNTER — Emergency Department: Payer: Medicare Other

## 2017-03-12 ENCOUNTER — Inpatient Hospital Stay
Admission: EM | Admit: 2017-03-12 | Discharge: 2017-03-16 | DRG: 056 | Disposition: A | Discharge status: Skilled Nursing Facility | Payer: Medicare Other | Attending: Internal Medicine | Admitting: Internal Medicine

## 2017-03-12 DIAGNOSIS — E860 Dehydration: Secondary | ICD-10-CM | POA: Diagnosis present

## 2017-03-12 DIAGNOSIS — G20 Parkinson's disease: Secondary | ICD-10-CM | POA: Diagnosis not present

## 2017-03-12 DIAGNOSIS — R4182 Altered mental status, unspecified: Secondary | ICD-10-CM | POA: Diagnosis not present

## 2017-03-12 DIAGNOSIS — R1312 Dysphagia, oropharyngeal phase: Secondary | ICD-10-CM

## 2017-03-12 DIAGNOSIS — Z8249 Family history of ischemic heart disease and other diseases of the circulatory system: Secondary | ICD-10-CM

## 2017-03-12 DIAGNOSIS — R531 Weakness: Secondary | ICD-10-CM

## 2017-03-12 DIAGNOSIS — F0281 Dementia in other diseases classified elsewhere with behavioral disturbance: Secondary | ICD-10-CM

## 2017-03-12 DIAGNOSIS — E43 Unspecified severe protein-calorie malnutrition: Secondary | ICD-10-CM | POA: Diagnosis present

## 2017-03-12 DIAGNOSIS — R410 Disorientation, unspecified: Secondary | ICD-10-CM | POA: Diagnosis not present

## 2017-03-12 DIAGNOSIS — R4781 Slurred speech: Secondary | ICD-10-CM | POA: Diagnosis present

## 2017-03-12 DIAGNOSIS — F329 Major depressive disorder, single episode, unspecified: Secondary | ICD-10-CM | POA: Diagnosis present

## 2017-03-12 DIAGNOSIS — Z66 Do not resuscitate: Secondary | ICD-10-CM | POA: Diagnosis present

## 2017-03-12 DIAGNOSIS — G2581 Restless legs syndrome: Secondary | ICD-10-CM | POA: Diagnosis present

## 2017-03-12 DIAGNOSIS — Z515 Encounter for palliative care: Secondary | ICD-10-CM

## 2017-03-12 DIAGNOSIS — E039 Hypothyroidism, unspecified: Secondary | ICD-10-CM | POA: Diagnosis present

## 2017-03-12 DIAGNOSIS — Z79899 Other long term (current) drug therapy: Secondary | ICD-10-CM

## 2017-03-12 DIAGNOSIS — F419 Anxiety disorder, unspecified: Secondary | ICD-10-CM | POA: Diagnosis present

## 2017-03-12 DIAGNOSIS — Z888 Allergy status to other drugs, medicaments and biological substances status: Secondary | ICD-10-CM

## 2017-03-12 DIAGNOSIS — Z87442 Personal history of urinary calculi: Secondary | ICD-10-CM

## 2017-03-12 DIAGNOSIS — I2510 Atherosclerotic heart disease of native coronary artery without angina pectoris: Secondary | ICD-10-CM | POA: Diagnosis present

## 2017-03-12 DIAGNOSIS — Z951 Presence of aortocoronary bypass graft: Secondary | ICD-10-CM

## 2017-03-12 DIAGNOSIS — I10 Essential (primary) hypertension: Secondary | ICD-10-CM | POA: Diagnosis present

## 2017-03-12 DIAGNOSIS — Z6821 Body mass index (BMI) 21.0-21.9, adult: Secondary | ICD-10-CM

## 2017-03-12 LAB — COMPREHENSIVE METABOLIC PANEL
ALBUMIN: 5 g/dL (ref 3.5–5.0)
ALT: 27 U/L (ref 17–63)
ANION GAP: 9 (ref 5–15)
AST: 29 U/L (ref 15–41)
Alkaline Phosphatase: 46 U/L (ref 38–126)
BUN: 53 mg/dL — ABNORMAL HIGH (ref 6–20)
CALCIUM: 9.9 mg/dL (ref 8.9–10.3)
CO2: 28 mmol/L (ref 22–32)
CREATININE: 1.33 mg/dL — AB (ref 0.61–1.24)
Chloride: 105 mmol/L (ref 101–111)
GFR calc non Af Amer: 45 mL/min — ABNORMAL LOW (ref >60)
GFR, EST AFRICAN AMERICAN: 53 mL/min — AB (ref >60)
GLUCOSE: 101 mg/dL — AB (ref 65–99)
Potassium: 4.1 mmol/L (ref 3.5–5.1)
Sodium: 142 mmol/L (ref 135–145)
TOTAL PROTEIN: 8 g/dL (ref 6.5–8.1)
Total Bilirubin: 1.5 mg/dL — ABNORMAL HIGH (ref 0.3–1.2)

## 2017-03-12 LAB — MRSA PCR SCREENING: MRSA BY PCR: NEGATIVE

## 2017-03-12 LAB — URINALYSIS, COMPLETE (UACMP) WITH MICROSCOPIC
BACTERIA UA: NONE SEEN
Bilirubin Urine: NEGATIVE
Glucose, UA: NEGATIVE mg/dL
HGB URINE DIPSTICK: NEGATIVE
KETONES UR: 5 mg/dL — AB
NITRITE: NEGATIVE
PROTEIN: NEGATIVE mg/dL
SPECIFIC GRAVITY, URINE: 1.025 (ref 1.005–1.030)
Squamous Epithelial / LPF: NONE SEEN
pH: 5 (ref 5.0–8.0)

## 2017-03-12 LAB — CBC
HCT: 41.3 % (ref 40.0–52.0)
Hemoglobin: 14.2 g/dL (ref 13.0–18.0)
MCH: 32.3 pg (ref 26.0–34.0)
MCHC: 34.3 g/dL (ref 32.0–36.0)
MCV: 94.1 fL (ref 80.0–100.0)
PLATELETS: 149 10*3/uL — AB (ref 150–440)
RBC: 4.39 MIL/uL — ABNORMAL LOW (ref 4.40–5.90)
RDW: 13.3 % (ref 11.5–14.5)
WBC: 6.4 10*3/uL (ref 3.8–10.6)

## 2017-03-12 LAB — TROPONIN I: Troponin I: 0.03 ng/mL (ref <0.03)

## 2017-03-12 LAB — LACTIC ACID, PLASMA: LACTIC ACID, VENOUS: 1.1 mmol/L (ref 0.5–1.9)

## 2017-03-12 MED ORDER — LOPERAMIDE HCL 2 MG PO CAPS: 2.0000 mg | ORAL_CAPSULE | ORAL | Status: DC | PRN

## 2017-03-12 MED ORDER — SODIUM CHLORIDE 0.9 % IV SOLN
1000.0000 mL | Freq: Once | INTRAVENOUS | Status: AC
  Administered 2017-03-12: 1000 mL via INTRAVENOUS

## 2017-03-12 MED ORDER — ACETAMINOPHEN 325 MG PO TABS: 650.0000 mg | ORAL_TABLET | Freq: Four times a day (QID) | ORAL | Status: DC | PRN

## 2017-03-12 MED ORDER — MELATONIN 5 MG PO TABS
5.0000 mg | ORAL_TABLET | Freq: Every day | ORAL | Status: DC
  Administered 2017-03-12 – 2017-03-15 (×3): 5 mg via ORAL
  Filled 2017-03-12 (×5): qty 1

## 2017-03-12 MED ORDER — SENNOSIDES-DOCUSATE SODIUM 8.6-50 MG PO TABS: 1.0000 | ORAL_TABLET | Freq: Every evening | ORAL | Status: DC | PRN

## 2017-03-12 MED ORDER — LEVOTHYROXINE SODIUM 50 MCG PO TABS
75.0000 ug | ORAL_TABLET | Freq: Every day | ORAL | Status: DC
  Administered 2017-03-14 – 2017-03-16 (×3): 75 ug via ORAL
  Filled 2017-03-12 (×4): qty 1

## 2017-03-12 MED ORDER — GUAIFENESIN 100 MG/5ML PO SYRP
300.0000 mg | ORAL_SOLUTION | Freq: Four times a day (QID) | ORAL | Status: DC | PRN
  Filled 2017-03-12: qty 15

## 2017-03-12 MED ORDER — PRIMIDONE 50 MG PO TABS
50.0000 mg | ORAL_TABLET | Freq: Every day | ORAL | Status: DC
  Administered 2017-03-12 – 2017-03-15 (×4): 50 mg via ORAL
  Filled 2017-03-12 (×5): qty 1

## 2017-03-12 MED ORDER — SODIUM CHLORIDE 0.9 % IV SOLN
INTRAVENOUS | Status: DC
  Administered 2017-03-12 – 2017-03-14 (×4): via INTRAVENOUS

## 2017-03-12 MED ORDER — HEPARIN SODIUM (PORCINE) 5000 UNIT/ML IJ SOLN
5000.0000 [IU] | Freq: Three times a day (TID) | INTRAMUSCULAR | Status: DC
  Administered 2017-03-12 – 2017-03-15 (×8): 5000 [IU] via SUBCUTANEOUS
  Filled 2017-03-12 (×9): qty 1

## 2017-03-12 MED ORDER — CARBIDOPA-LEVODOPA-ENTACAPONE 50-200-200 MG PO TABS: 1.0000 | ORAL_TABLET | ORAL | Status: DC

## 2017-03-12 MED ORDER — SODIUM CHLORIDE 0.9% FLUSH
3.0000 mL | Freq: Two times a day (BID) | INTRAVENOUS | Status: DC
  Administered 2017-03-12 – 2017-03-16 (×6): 3 mL via INTRAVENOUS

## 2017-03-12 MED ORDER — TRAZODONE HCL 50 MG PO TABS
50.0000 mg | ORAL_TABLET | Freq: Every day | ORAL | Status: DC
  Administered 2017-03-12 – 2017-03-15 (×4): 50 mg via ORAL
  Filled 2017-03-12 (×4): qty 1

## 2017-03-12 MED ORDER — DOCUSATE SODIUM 100 MG PO CAPS: 100.0000 mg | ORAL_CAPSULE | Freq: Two times a day (BID) | ORAL | Status: DC | PRN

## 2017-03-12 MED ORDER — MELATONIN 3 MG PO TABS: 1.0000 | ORAL_TABLET | Freq: Every day | ORAL | Status: DC

## 2017-03-12 MED ORDER — ORAL CARE MOUTH RINSE
15.0000 mL | Freq: Two times a day (BID) | OROMUCOSAL | Status: DC
  Administered 2017-03-12 – 2017-03-16 (×7): 15 mL via OROMUCOSAL

## 2017-03-12 MED ORDER — SODIUM CHLORIDE 0.9% FLUSH: 3.0000 mL | INTRAVENOUS | Status: DC | PRN

## 2017-03-12 MED ORDER — CARBIDOPA-LEVODOPA ER 50-200 MG PO TBCR
1.0000 | EXTENDED_RELEASE_TABLET | Freq: Every day | ORAL | Status: DC
  Administered 2017-03-12 (×3): 1 via ORAL
  Filled 2017-03-12 (×9): qty 1

## 2017-03-12 MED ORDER — SERTRALINE HCL 50 MG PO TABS
50.0000 mg | ORAL_TABLET | Freq: Every day | ORAL | Status: DC
  Administered 2017-03-12 – 2017-03-16 (×4): 50 mg via ORAL
  Filled 2017-03-12 (×5): qty 1

## 2017-03-12 MED ORDER — DIAZEPAM 2 MG PO TABS: 2.0000 mg | ORAL_TABLET | Freq: Two times a day (BID) | ORAL | Status: DC | PRN

## 2017-03-12 MED ORDER — DIPHENHYDRAMINE HCL 25 MG PO CAPS: 25.0000 mg | ORAL_CAPSULE | Freq: Four times a day (QID) | ORAL | Status: DC | PRN

## 2017-03-12 MED ORDER — VITAMIN D (ERGOCALCIFEROL) 1.25 MG (50000 UNIT) PO CAPS
50000.0000 [IU] | ORAL_CAPSULE | ORAL | Status: DC
  Filled 2017-03-12: qty 1

## 2017-03-12 MED ORDER — ENTACAPONE 200 MG PO TABS
200.0000 mg | ORAL_TABLET | Freq: Every day | ORAL | Status: DC
  Administered 2017-03-12 (×3): 200 mg via ORAL
  Filled 2017-03-12 (×9): qty 1

## 2017-03-12 NOTE — Progress Notes (Signed)
TC with dgt with update given; she will decide whether or not to come see pt. Reports she has lots of anxiety and will try to collect herself to come.

## 2017-03-12 NOTE — ED Provider Notes (Signed)
[facility] Emergency Department Provider Note   ____________________________________________    I have reviewed the triage vital signs and the nursing notes.   HISTORY  Chief Complaint Weakness  Patient unable to provide history of present illness, due to altered mental status   HPI Anthony Blankenship is a 83 y.o. male with a history of Parkinson's disease who presents with altered mental status and weakness. Reportedly the patient is able to ambulate and carry on conversations however over the last 2 days he has not been getting out of bed and is not speaking. Reportedly also not taking his medications or eating. No fevers reported.   Past Medical History:  Diagnosis Date  . Anxiety   . Coronary artery disease   . Depression   . Hypertension   . Hypothyroidism   . Kidney stones   . Parkinson's disease (HCC)   . Restless leg syndrome     Patient Active Problem List   Diagnosis Date Noted  . Altered mental status 02/08/2017  . Frailty 12/14/2016  . Orthostatic hypotension 12/14/2016  . Parkinson's disease (HCC)   . Palliative care by specialist   . Goals of care, counseling/discussion   . Gait instability 09/13/2016  . Recurrent falls 09/13/2016  . Coronary artery disease involving native coronary artery of native heart with angina pectoris (HCC) 09/08/2016  . Near syncope 06/12/2016  . Lower GI bleed 04/25/2016  . GI bleed 04/25/2016  . Restlessness 05/14/2015    Past Surgical History:  Procedure Laterality Date  . APPENDECTOMY    . CARDIAC CATHETERIZATION    . CORONARY ARTERY BYPASS GRAFT      Prior to Admission medications   Medication Sig Start Date End Date Taking? Authorizing Provider  acetaminophen (TYLENOL) 325 MG tablet Take 650 mg by mouth every 6 (six) hours as needed for mild pain.     [provider]  carbidopa-levodopa-entacapone (STALEVO) 50-200-200 MG tablet Take 1 tablet by mouth See admin instructions.  Take one tablet by mouth seven times daily    [provider]  diazepam (VALIUM) 2 MG tablet Take 1 tablet (2 mg total) by mouth every 12 (twelve) hours as needed for anxiety. 09/16/16   Delfino Lovett, MD  diclofenac sodium (VOLTAREN) 1 % GEL Apply 1 g topically 3 (three) times daily.    [provider]  diphenhydrAMINE (BENADRYL) 25 MG tablet Take 25 mg by mouth every 6 (six) hours as needed.    [provider]  guaifenesin (ROBITUSSIN) 100 MG/5ML syrup Take 300 mg by mouth 4 (four) times daily as needed for cough.    [provider]  levothyroxine (SYNTHROID, LEVOTHROID) 75 MCG tablet Take 75 mcg by mouth daily.     [provider]  lidocaine (ASPERCREME W/LIDOCAINE) 4 % cream Apply 1 application topically 3 (three) times daily as needed.    [provider]  loperamide (IMODIUM A-D) 2 MG tablet Take 2 mg by mouth as needed for diarrhea or loose stools. Do not exceed 8 doses in 24 hours    [provider]  magnesium hydroxide (MILK OF MAGNESIA) 400 MG/5ML suspension Take 30 mLs by mouth daily as needed for mild constipation.    [provider]  Melatonin 3 MG TABS Take 1 tablet by mouth at bedtime.    [provider]  primidone (MYSOLINE) 50 MG tablet Take 50 mg by mouth at bedtime.    [provider]  senna-docusate (SENOKOT-S) 8.6-50 MG tablet Take  1 tablet by mouth at bedtime as needed for mild constipation. 02/09/17   Ramonita Lab, MD  sertraline (ZOLOFT) 50 MG tablet Take 1 tablet (50 mg total) by mouth daily. 02/10/17   Ramonita Lab, MD  traZODone (DESYREL) 50 MG tablet Take 50 mg by mouth at bedtime.     [provider]  Vitamin D, Ergocalciferol, (DRISDOL) 50000 units CAPS capsule Take 50,000 Units by mouth every 7 (seven) days.    [provider]     Allergies Other  Family History  Problem Relation Age of Onset  . Liver disease Father   . Heart disease Mother   . Kidney disease  Neg Hx   . Prostate cancer Neg Hx   . Bladder Cancer Neg Hx     Social History Social History  Substance Use Topics  . Smoking status: Never Smoker  . Smokeless tobacco: Never Used  . Alcohol use No    Level V caveat: Unable to obtain Review of Systems due to altered mental status    ____________________________________________   PHYSICAL EXAM:  VITAL SIGNS: ED Triage Vitals  Enc Vitals Group     BP      Pulse      Resp      Temp      Temp src      SpO2      Weight      Height      Head Circumference      Peak Flow      Pain Score      Pain Loc      Pain Edu?      Excl. in GC?     Constitutional: Alert.. No acute distress.  Eyes: Conjunctivae are normal. PERRLA Head: Atraumatic. Nose: No congestion/rhinnorhea. No evidence of trauma Mouth/Throat: Mucous membranes are dry Neck:  Painless ROM, no vertebral tenderness to palpation Cardiovascular: Tachycardia, regular rhythm.   Good peripheral circulation. Respiratory: Normal respiratory effort.  No retractions. Lungs CTAB. Gastrointestinal: Soft and nontender. No distention.  No CVA tenderness. Genitourinary: No rash or induration Musculoskeletal:  Warm and well perfused Neurologic:   No gross focal neurologic deficits are appreciated.  Skin:  Skin is warm, dry and intact. No rash noted.   ____________________________________________   LABS (all labs ordered are listed, but only abnormal results are displayed)  Labs Reviewed  CBC - Abnormal; Notable for the following:       Result Value   RBC 4.39 (*)    Platelets 149 (*)    All other components within normal limits  COMPREHENSIVE METABOLIC PANEL - Abnormal; Notable for the following:    Glucose, Bld 101 (*)    BUN 53 (*)    Creatinine, Ser 1.33 (*)    Total Bilirubin 1.5 (*)    GFR calc non Af Amer 45 (*)    GFR calc Af Amer 53 (*)    All other components within normal limits  TROPONIN I - Abnormal; Notable for the following:    Troponin I 0.03  (*)    All other components within normal limits  URINALYSIS, COMPLETE (UACMP) WITH MICROSCOPIC - Abnormal; Notable for the following:    Color, Urine YELLOW (*)    APPearance CLEAR (*)    Ketones, ur 5 (*)    Leukocytes, UA TRACE (*)    All other components within normal limits  CULTURE, BLOOD (ROUTINE X 2)  CULTURE, BLOOD (ROUTINE X 2)  LACTIC ACID, PLASMA   ____________________________________________  EKG  ED ECG REPORT I, Jene Every, the attending physician, personally viewed and interpreted this ECG.  Date: 03/12/2017 EKG Time: 8:20 AM Rate: 64 Rhythm: normal sinus rhythm QRS Axis: normal Intervals: Right bundle branch block ST/T Wave abnormalities: Nonspecific changes   ____________________________________________  RADIOLOGY  CT head unremarkable Chest x-ray no acute distress ____________________________________________   PROCEDURES  Procedure(s) performed: No    Critical Care performed: No ____________________________________________   INITIAL IMPRESSION / ASSESSMENT AND PLAN / ED COURSE  Pertinent labs & imaging results that were available during my care of the patient were reviewed by me and considered in my medical decision making (see chart for details).  Patient presents with altered mental status. Gradual decline over 2 days. Currently afebrile. We will check labs, chest x-ray, urine, CT head to evaluate for causes of altered mental status. Differential includes metabolic encephalopathy, CVA, ICH, failure to thrive, electrolyte abnormality/dehydration.  Lab work demonstrates significant dehydration however no evidence of infectious etiology, lactate normal, chest x-ray and urinalysis unremarkable. White blood cell count normal. Blood cultures pending. I will admit to the hospital service for further evaluation of his significant change in mental status    ____________________________________________   FINAL CLINICAL IMPRESSION(S) / ED  DIAGNOSES  Final diagnoses:  Weakness  Dehydration  Altered mental status, unspecified altered mental status type      NEW MEDICATIONS STARTED DURING THIS VISIT:  New Prescriptions   No medications on file     Note:  This document was prepared using Dragon voice recognition software and may include unintentional dictation errors.    Jene Every, MD 03/12/17 1031

## 2017-03-12 NOTE — [place] FL2 (Signed)
[place] MEDICAID FL2 LEVEL OF CARE SCREENING TOOL     IDENTIFICATION  Patient Name: Anthony Blankenship Birthdate: 12-08-1924 Sex: male Admission Date (Current Location): 03/12/2017  [place] and [place] Number:  Chiropodist and Address:  [facility], [address], [place], [place] [address]      Provider Number: [id number]  Attending Physician Name and Address:  Altamese Dilling, *  Relative Name and Phone Number:       Current Level of Care: Domiciliary (Rest home) ([facility]) Recommended Level of Care: Assisted Living Facility Prior Approval Number:    Date Approved/Denied:   PASRR Number: [id number]  Discharge Plan: Other (Comment) (TBD)    Current Diagnoses: Patient Active Problem List   Diagnosis Date Noted  . Altered mental status 02/08/2017  . Frailty 12/14/2016  . Orthostatic hypotension 12/14/2016  . Parkinson's disease (HCC)   . Palliative care by specialist   . Goals of care, counseling/discussion   . Gait instability 09/13/2016  . Recurrent falls 09/13/2016  . Coronary artery disease involving native coronary artery of native heart with angina pectoris (HCC) 09/08/2016  . Near syncope 06/12/2016  . Lower GI bleed 04/25/2016  . GI bleed 04/25/2016  . Restlessness 05/14/2015    Orientation RESPIRATION BLADDER Height & Weight     Self, Situation  Normal Incontinent Weight: 160 lb (72.6 kg) Height:   (177.8 cm)  BEHAVIORAL SYMPTOMS/MOOD NEUROLOGICAL BOWEL NUTRITION STATUS      Incontinent Diet (soft)  AMBULATORY STATUS COMMUNICATION OF NEEDS Skin   Supervision Verbally (whispers or mumbles) Normal                       Personal Care Assistance Level of Assistance  Bathing, Feeding, Dressing, Total care Bathing Assistance: Limited assistance Feeding assistance: Maximum assistance Dressing Assistance: Limited assistance Total Care Assistance: Limited assistance    Functional Limitations Info  Sight, Hearing, Speech Sight Info: Adequate Hearing Info: Adequate Speech Info: Impaired    SPECIAL CARE FACTORS FREQUENCY                       Contractures Contractures Info: Not present    Additional Factors Info  Allergies   Allergies Info: other           Current Medications (03/12/2017):  This is the current hospital active medication list No current facility-administered medications for this encounter.    Current Outpatient Prescriptions  Medication Sig Dispense Refill  . acetaminophen (TYLENOL) 325 MG tablet Take 650 mg by mouth every 6 (six) hours as needed for mild pain.     . barrier cream (NON-SPECIFIED) CREA Apply 1 application topically as needed.    . carbidopa-levodopa-entacapone (STALEVO) 50-200-200 MG tablet Take 1 tablet by mouth See admin instructions. Take one tablet by mouth six times daily    . diazepam (VALIUM) 2 MG tablet Take 1 tablet (2 mg total) by mouth every 12 (twelve) hours as needed for anxiety. 10 tablet 0  . diphenhydrAMINE (BENADRYL) 25 MG tablet Take 25 mg by mouth every 6 (six) hours as needed.    Marland Kitchen guaifenesin (ROBITUSSIN) 100 MG/5ML syrup Take 300 mg by mouth 4 (four) times daily as needed for cough.    . levothyroxine (SYNTHROID, LEVOTHROID) 75 MCG tablet Take 75 mcg by mouth daily.     Marland Kitchen loperamide (IMODIUM A-D) 2 MG tablet Take 2 mg by mouth as needed for diarrhea or loose  stools. Do not exceed 8 doses in 24 hours    . magnesium hydroxide (MILK OF MAGNESIA) 400 MG/5ML suspension Take 30 mLs by mouth daily as needed for mild constipation.    . Melatonin 3 MG TABS Take 1 tablet by mouth at bedtime.    Marland Kitchen QUEtiapine (SEROQUEL) 25 MG tablet Take 25 mg by mouth at bedtime.    . sertraline (ZOLOFT) 50 MG tablet Take 1 tablet (50 mg total) by mouth daily.    . traZODone (DESYREL) 50 MG tablet Take 25 mg by mouth at bedtime.     . diclofenac sodium (VOLTAREN) 1 % GEL Apply 1 g topically 3 (three) times  daily.    Marland Kitchen lidocaine (ASPERCREME W/LIDOCAINE) 4 % cream Apply 1 application topically 3 (three) times daily as needed.    . primidone (MYSOLINE) 50 MG tablet Take 50 mg by mouth at bedtime.    . senna-docusate (SENOKOT-S) 8.6-50 MG tablet Take 1 tablet by mouth at bedtime as needed for mild constipation. (Patient not taking: Reported on 03/12/2017)    . Vitamin D, Ergocalciferol, (DRISDOL) 50000 units CAPS capsule Take 50,000 Units by mouth every 7 (seven) days.       Discharge Medications: Please see discharge summary for a list of discharge medications.  Relevant Imaging Results:  Relevant Lab Results:   Additional Information SSN [id number]  Cheron Schaumann, [place]

## 2017-03-12 NOTE — Progress Notes (Addendum)
Admitted from the [facility] with pt reportedly not eating/talking for 2 days. Pt has generalized tremors related to his parkinson's disease. Will now speak a few words with weak voice, shake his head yes or no. Difficult to do neurochecks with pt holding eyelids tightly- left eye cloudy with appearance like cataract. VSS. No acute distress. Pt currently resting quietly with IVF's infusing. PT consult attempted but unsuccessful.

## 2017-03-12 NOTE — Progress Notes (Signed)
LCSW introduced myself to Mr Anthony Blankenship and was unable to assess he was mumbling and unable to answer questions. Will call the [facility] and his daughter to collect information to complete assessment.  Marshal Eskew LCSW

## 2017-03-12 NOTE — Progress Notes (Signed)
LCSW called and spoke with patients daughter. She has requested to be called once he is on the medical floor to be told what room his number is [name]  [phone].   [employer] LCSW [phone]

## 2017-03-12 NOTE — Clinical Social Work Note (Signed)
Clinical Social Work Assessment  Patient Details  Name: Anthony Blankenship MRN: [id number] Date of Birth: 1925-01-02  Date of referral:  03/12/17               Reason for consult:       From a facility            Permission sought to share information with:  Family Supports, Magazine features editor Permission granted to share information::  Yes, Verbal Permission Granted  Name::      Daughter Anthony Blankenship [phone]  Agency::   [facility]  Relationship::     Contact Information:     Housing/Transportation Living arrangements for the past 2 months:  Assisted Living Facility Source of Information:  Facility Patient Interpreter Needed:  None Criminal Activity/Legal Involvement Pertinent to Current Situation/Hospitalization:  No - Comment as needed Significant Relationships:  Adult Children Lives with:  Facility Resident Do you feel safe going back to the place where you live?  Yes Need for family participation in patient care:  Yes (Comment)  Care giving concerns:  He was having issues swallowing food, fluids, meds and speaking ( mumbling)   Office manager / plan: LCSW introduced myself to patient and it was apparent I would need to contact his facility to collect data. Patient was agreeable for me to contact his daughter and nodded yes to consent for family and facility. LCSW called and spoke to [place] at the [facility]. Patient has Parkinson's disease and resided at the [facility] since April 12,2016. He has a daughter Anthony Blankenship [phone]. Patient uses a wheelchair and walker and needs full ADL assistance. Patient was verbal yesterday but today he whispers and mumbles. Patient is incontinent. He is not a diabetic, nor does he use 02. He is also hard of hearing. As per facility he usually understands what is being asked. He is on a soft fluid diet. Patient is being admitted for further medical work up. LCSW will complete assessment and start Fl2. Patient  is able to return to facility once discharged. PT may need to assess his current mobility issues.  Employment status:  Retired Health and safety inspector:  Medicare PT Recommendations:  Not assessed at this time Information / Referral to community resources:  Other (Comment Required) (None required at this time)  Patient/Family's Response to care:  Concerned about her dad.  Patient/Family's Understanding of and Emotional Response to Diagnosis, Current Treatment, and Prognosis: Daughter has a good understanding Dad has been challenged with parkinson's and swallowing lately.  Emotional Assessment Appearance:  Appears stated age Attitude/Demeanor/Rapport:  Unable to Assess Affect (typically observed):  Unable to Assess Orientation:  Oriented to Self Alcohol / Substance use:  Not Applicable Psych involvement (Current and /or in the community):  No (Comment)  Discharge Needs  Concerns to be addressed:  No discharge needs identified Readmission within the last 30 days:  No Current discharge risk:  None Barriers to Discharge:  Continued Medical Work up   Gaylord, Ninfa Meeker, LCSW 03/12/2017, 11:03 AM

## 2017-03-12 NOTE — ED Triage Notes (Signed)
Pt came to ED via EMS from the [facility]. Per staff, pt has not been eating or talking like normal for past 2 days. Generalized weakness. History of Parkinsons. Pt responsive to pain. VS stable at this time.

## 2017-03-12 NOTE — ED Notes (Signed)
Pt transported to CT ?

## 2017-03-12 NOTE — H&P (Signed)
[facility] [place] [facility]   PATIENT NAME: Anthony Blankenship    MR#:  [id number]  DATE OF BIRTH:  1924/10/08  DATE OF ADMISSION:  03/12/2017  PRIMARY CARE PHYSICIAN: Royetta Asal, MD   REQUESTING/REFERRING PHYSICIAN: Cyril Loosen  CHIEF COMPLAINT:   Chief Complaint  Patient presents with  . Weakness    HISTORY OF PRESENT ILLNESS: Anthony Blankenship  is a 83 y.o. male with a known history of CAD, Anxiety, Htn, parkinson's dz, Hypothyroidism- lives in assisted living place- sent today as noted to have swallowing difficulty. No complains by pt, his speech is hard to understand, I spoke to his daughter on phone, she denies any new symptoms. But as per her - his parkinson is getting worse.  PAST MEDICAL HISTORY:   Past Medical History:  Diagnosis Date  . Anxiety   . Coronary artery disease   . Depression   . Hypertension   . Hypothyroidism   . Kidney stones   . Parkinson's disease (HCC)   . Restless leg syndrome     PAST SURGICAL HISTORY: Past Surgical History:  Procedure Laterality Date  . APPENDECTOMY    . CARDIAC CATHETERIZATION    . CORONARY ARTERY BYPASS GRAFT      SOCIAL HISTORY:  Social History  Substance Use Topics  . Smoking status: Never Smoker  . Smokeless tobacco: Never Used  . Alcohol use No    FAMILY HISTORY:  Family History  Problem Relation Age of Onset  . Liver disease Father   . Heart disease Mother   . Kidney disease Neg Hx   . Prostate cancer Neg Hx   . Bladder Cancer Neg Hx     DRUG ALLERGIES:  Allergies  Allergen Reactions  . Other Other (See Comments)    Uncoded Allergy. Allergen: Sleeping Medications    REVIEW OF SYSTEMS:   CONSTITUTIONAL: No fever, fatigue or weakness.  EYES: No blurred or double vision.  EARS, NOSE, AND THROAT: No tinnitus or ear pain.  RESPIRATORY: No cough, shortness of breath, wheezing or hemoptysis.  CARDIOVASCULAR: No chest pain, orthopnea, edema.  GASTROINTESTINAL: No nausea, vomiting,  diarrhea or abdominal pain.  GENITOURINARY: No dysuria, hematuria.  ENDOCRINE: No polyuria, nocturia,  HEMATOLOGY: No anemia, easy bruising or bleeding SKIN: No rash or lesion. MUSCULOSKELETAL: No joint pain or arthritis.   NEUROLOGIC: No tingling, numbness, weakness.  PSYCHIATRY: No anxiety or depression.   MEDICATIONS AT HOME:  Prior to Admission medications   Medication Sig Start Date End Date Taking? Authorizing Provider  acetaminophen (TYLENOL) 325 MG tablet Take 650 mg by mouth every 6 (six) hours as needed for mild pain.     [provider]  carbidopa-levodopa-entacapone (STALEVO) 50-200-200 MG tablet Take 1 tablet by mouth See admin instructions. Take one tablet by mouth seven times daily    [provider]  diazepam (VALIUM) 2 MG tablet Take 1 tablet (2 mg total) by mouth every 12 (twelve) hours as needed for anxiety. 09/16/16   Delfino Lovett, MD  diclofenac sodium (VOLTAREN) 1 % GEL Apply 1 g topically 3 (three) times daily.    [provider]  diphenhydrAMINE (BENADRYL) 25 MG tablet Take 25 mg by mouth every 6 (six) hours as needed.    [provider]  guaifenesin (ROBITUSSIN) 100 MG/5ML syrup Take 300 mg by mouth 4 (four) times daily as needed for cough.    [provider]  levothyroxine (SYNTHROID, LEVOTHROID) 75 MCG tablet Take 75 mcg by mouth daily.  [provider]  lidocaine (ASPERCREME W/LIDOCAINE) 4 % cream Apply 1 application topically 3 (three) times daily as needed.    [provider]  loperamide (IMODIUM A-D) 2 MG tablet Take 2 mg by mouth as needed for diarrhea or loose stools. Do not exceed 8 doses in 24 hours    [provider]  magnesium hydroxide (MILK OF MAGNESIA) 400 MG/5ML suspension Take 30 mLs by mouth daily as needed for mild constipation.    [provider]  Melatonin 3 MG TABS Take 1 tablet by mouth at bedtime.    [provider]  primidone (MYSOLINE) 50 MG tablet Take  50 mg by mouth at bedtime.    [provider]  senna-docusate (SENOKOT-S) 8.6-50 MG tablet Take 1 tablet by mouth at bedtime as needed for mild constipation. 02/09/17   Ramonita Lab, MD  sertraline (ZOLOFT) 50 MG tablet Take 1 tablet (50 mg total) by mouth daily. 02/10/17   Ramonita Lab, MD  traZODone (DESYREL) 50 MG tablet Take 50 mg by mouth at bedtime.     [provider]  Vitamin D, Ergocalciferol, (DRISDOL) 50000 units CAPS capsule Take 50,000 Units by mouth every 7 (seven) days.    [provider]      PHYSICAL EXAMINATION:   VITAL SIGNS: Blood pressure (!) 149/88, pulse (!) 246, temperature 99.1 F (37.3 C), temperature source Rectal, resp. rate 18, height  (1.778 m), weight 72.6 kg (160 lb), SpO2 (!) 85 %.  GENERAL:  83 y.o.-year-old patient lying in the bed with no acute distress.  EYES: Pupils equal, round, reactive to light and accommodation. No scleral icterus. Extraocular muscles intact.  HEENT: Head atraumatic, normocephalic. Oropharynx and nasopharynx clear.  NECK:  Supple, no jugular venous distention. No thyroid enlargement, no tenderness.  LUNGS: Normal breath sounds bilaterally, no wheezing, rales,rhonchi or crepitation. No use of accessory muscles of respiration.  CARDIOVASCULAR: S1, S2 normal. No murmurs, rubs, or gallops.  ABDOMEN: Soft, nontender, nondistended. Bowel sounds present. No organomegaly or mass.  EXTREMITIES: No pedal edema, cyanosis, or clubbing.  NEUROLOGIC: Cranial nerves II through XII are intact. Some shaking of his tongue noted. Muscle strength 3/5 in all extremities. Sensation intact. Gait not checked. Have resting tremors in limbs and tongue. PSYCHIATRIC: The patient is alert and oriented x 1-2.  SKIN: No obvious rash, lesion, or ulcer.   LABORATORY PANEL:   CBC  Recent Labs Lab 03/12/17 0819  WBC 6.4  HGB 14.2  HCT 41.3  PLT 149*  MCV 94.1  MCH 32.3  MCHC 34.3  RDW 13.3    ------------------------------------------------------------------------------------------------------------------  Chemistries   Recent Labs Lab 03/12/17 0819  NA 142  K 4.1  CL 105  CO2 28  GLUCOSE 101*  BUN 53*  CREATININE 1.33*  CALCIUM 9.9  AST 29  ALT 27  ALKPHOS 46  BILITOT 1.5*   ------------------------------------------------------------------------------------------------------------------ estimated creatinine clearance is 37.9 mL/min (A) (by C-G formula based on SCr of 1.33 mg/dL (H)). ------------------------------------------------------------------------------------------------------------------ No results for input(s): TSH, T4TOTAL, T3FREE, THYROIDAB in the last 72 hours.  Invalid input(s): FREET3   Coagulation profile No results for input(s): INR, PROTIME in the last 168 hours. ------------------------------------------------------------------------------------------------------------------- No results for input(s): DDIMER in the last 72 hours. -------------------------------------------------------------------------------------------------------------------  Cardiac Enzymes  Recent Labs Lab 03/12/17 0819  TROPONINI 0.03*   ------------------------------------------------------------------------------------------------------------------ Invalid input(s): POCBNP  ---------------------------------------------------------------------------------------------------------------  Urinalysis    Component Value Date/Time   COLORURINE YELLOW (A) 03/12/2017 0838   APPEARANCEUR CLEAR (A) 03/12/2017 1610  APPEARANCEUR Clear 10/18/2014 0603   LABSPEC 1.025 03/12/2017 0838   LABSPEC 1.032 10/18/2014 0603   PHURINE 5.0 03/12/2017 0838   GLUCOSEU NEGATIVE 03/12/2017 0838   GLUCOSEU Negative 10/18/2014 0603   HGBUR NEGATIVE 03/12/2017 0838   BILIRUBINUR NEGATIVE 03/12/2017 0838   BILIRUBINUR Negative 10/18/2014 0603   KETONESUR 5 (A) 03/12/2017 0838    PROTEINUR NEGATIVE 03/12/2017 0838   NITRITE NEGATIVE 03/12/2017 0838   LEUKOCYTESUR TRACE (A) 03/12/2017 0838   LEUKOCYTESUR Trace 10/18/2014 0603     RADIOLOGY: Ct Head Wo Contrast  Result Date: 03/12/2017 CLINICAL DATA:  Generalized weakness. Patient not eating or talking normally. EXAM: CT HEAD WITHOUT CONTRAST TECHNIQUE: Contiguous axial images were obtained from the base of the skull through the vertex without intravenous contrast. COMPARISON:  February 23, 2017 FINDINGS: Brain: No subdural, epidural, or subarachnoid hemorrhage. Cerebellum, brainstem, and basal cisterns are normal. No mass effect or midline shift. Ventricles and sulci are stable. White matter changes are stable. No acute cortical ischemia or infarct. No other acute abnormalities identified. Vascular: Calcified atherosclerosis is seen in the intracranial carotid arteries. Skull: Normal. Negative for fracture or focal lesion. Sinuses/Orbits: No acute finding. Other: None. IMPRESSION: No acute intracranial abnormality identified to explain the patient's symptoms. Electronically Signed   By: Gerome Sam III M.D   On: 03/12/2017 09:03   Dg Chest Portable 1 View  Result Date: 03/12/2017 CLINICAL DATA:  Weakness. EXAM: PORTABLE CHEST 1 VIEW COMPARISON:  February 23, 2017 FINDINGS: The heart size and mediastinal contours are within normal limits. Both lungs are clear. The visualized skeletal structures are unremarkable. IMPRESSION: No active disease. Electronically Signed   By: Gerome Sam III M.D   On: 03/12/2017 08:37    EKG: Orders placed or performed during the hospital encounter of 03/12/17  . ED EKG  . ED EKG  . EKG 12-Lead  . EKG 12-Lead    IMPRESSION AND PLAN:  * Altered mental status, swallowing difficulty   Most likely progression of parkinson's.   CT head is negative.   Pt have resting tremors and as per daughter, he may not stay still to get MRI.   I requested neurology consult.    We may have to focus  on management of the problem,rather than finding the cause.   SLP eval and PT eval. May need more and total care due to likely progressing parkinson.   Cont home parkinson meds.  * Depression and anxiety    Cont home meds.  All the records are reviewed and case discussed with ED provider. Management plans discussed with the patient, family and they are in agreement.  CODE STATUS: DNR Code Status History    Date Active Date Inactive Code Status Order ID Comments User Context   02/07/2017  2:24 PM 02/09/2017  8:26 PM DNR [id number]  Ihor Austin, MD Inpatient   09/13/2016  6:44 AM 09/16/2016  7:15 PM DNR [id number]  Ihor Austin, MD Inpatient   06/12/2016  2:55 AM 06/14/2016  8:30 PM DNR [id number]  Arnaldo Natal, MD Inpatient   04/25/2016  1:08 AM 04/25/2016  6:35 PM DNR [id number]  Tonye Royalty, DO Inpatient   04/25/2016  1:07 AM 04/25/2016  1:08 AM Full Code [id number]  Tonye Royalty, DO Inpatient   05/14/2015  7:05 PM 05/15/2015  4:51 PM DNR [id number]  Altamese Dilling, MD ED    Questions for Most Recent Historical Code Status (Order [id number])    Question Answer Comment   In the event of  cardiac or respiratory ARREST Do not call a "code blue"    In the event of cardiac or respiratory ARREST Do not perform Intubation, CPR, defibrillation or ACLS    In the event of cardiac or respiratory ARREST Use medication by any route, position, wound care, and other measures to relive pain and suffering. May use oxygen, suction and manual treatment of airway obstruction as needed for comfort.        TOTAL TIME TAKING CARE OF THIS PATIENT:45 minutes.  Discussed with his daughter on phone.  Altamese Dilling M.D on 03/12/2017   Between 7am to 6pm - Pager - [phone]  After 6pm go to [email or web address] - password [email or web address]  [facility]  Office  [phone]  CC: Primary care physician; Royetta Asal, MD   Note: This dictation was prepared with Dragon  dictation along with smaller phrase technology. Any transcriptional errors that result from this process are unintentional.

## 2017-03-12 NOTE — ED Notes (Signed)
Spoke to patient's daughter.

## 2017-03-12 NOTE — ED Notes (Signed)
Pt repositioned

## 2017-03-12 NOTE — Consult Note (Signed)
Reason for Consult:parkinson's dz Referring Physician: Dr. Elisabeth Pigeon  CC: parkinson's dz   HPI: Anthony Blankenship is an 83 y.o. male  with a known history of CAD, Anxiety, Htn, parkinson's dz, Hypothyroidism- lives in assisted living place- sent today as noted to have increased swallowing difficulty. He has been having decreased responsiveness, worsening speech and increased tremor.    Past Medical History:  Diagnosis Date  . Anxiety   . Coronary artery disease   . Depression   . Hypertension   . Hypothyroidism   . Kidney stones   . Parkinson's disease (HCC)   . Restless leg syndrome     Past Surgical History:  Procedure Laterality Date  . APPENDECTOMY    . CARDIAC CATHETERIZATION    . CORONARY ARTERY BYPASS GRAFT      Family History  Problem Relation Age of Onset  . Liver disease Father   . Heart disease Mother   . Kidney disease Neg Hx   . Prostate cancer Neg Hx   . Bladder Cancer Neg Hx     Social History:  reports that he has never smoked. He has never used smokeless tobacco. He reports that he does not drink alcohol or use drugs.  Allergies  Allergen Reactions  . Other Other (See Comments)    Uncoded Allergy. Allergen: Sleeping Medications    Medications: I have reviewed the patient's current medications.  ROS: Unable to obtain as appears to be confused.   Physical Examination: Blood pressure (!) 173/84, pulse 70, temperature 98.6 F (37 C), resp. rate 18, height  (1.778 m), weight 72.6 kg (160 lb), SpO2 100 %.   Neurological Examination   Mental Status: Opens eyes to name Cranial Nerves: II: Discs flat bilaterally; At time disconjugate gaze.  III,IV, VI: ptosis not present, extra-ocular motions intact bilaterally V,VII:? L facial droop IX,X: gag reflex present XI: bilateral shoulder shrug XII: midline tongue extension Motor: Increased tone bilaterally L > R       Sensory: Pinprick and light touch intact throughout, bilaterally Deep Tendon  Reflexes: + and symmetric throughout Plantars: Right: downgoing   Left: up going  Cerebellar: Not tested Gait: not tested       Laboratory Studies:   Basic Metabolic Panel:  Recent Labs Lab 03/12/17 0819  NA 142  K 4.1  CL 105  CO2 28  GLUCOSE 101*  BUN 53*  CREATININE 1.33*  CALCIUM 9.9    Liver Function Tests:  Recent Labs Lab 03/12/17 0819  AST 29  ALT 27  ALKPHOS 46  BILITOT 1.5*  PROT 8.0  ALBUMIN 5.0   No results for input(s): LIPASE, AMYLASE in the last 168 hours. No results for input(s): AMMONIA in the last 168 hours.  CBC:  Recent Labs Lab 03/12/17 0819  WBC 6.4  HGB 14.2  HCT 41.3  MCV 94.1  PLT 149*    Cardiac Enzymes:  Recent Labs Lab 03/12/17 0819  TROPONINI 0.03*    BNP: Invalid input(s): POCBNP  CBG: No results for input(s): GLUCAP in the last 168 hours.  Microbiology: Results for orders placed or performed during the hospital encounter of 02/07/17  MRSA PCR Screening     Status: None   Collection Time: 02/07/17  2:46 PM  Result Value Ref Range Status   MRSA by PCR NEGATIVE NEGATIVE Final    Comment:        The GeneXpert MRSA Assay (FDA approved for NASAL specimens only), is one component of a comprehensive MRSA colonization  surveillance program. It is not intended to diagnose MRSA infection nor to guide or monitor treatment for MRSA infections.     Coagulation Studies: No results for input(s): LABPROT, INR in the last 72 hours.  Urinalysis:  Recent Labs Lab 03/12/17 0838  COLORURINE YELLOW*  LABSPEC 1.025  PHURINE 5.0  GLUCOSEU NEGATIVE  HGBUR NEGATIVE  BILIRUBINUR NEGATIVE  KETONESUR 5*  PROTEINUR NEGATIVE  NITRITE NEGATIVE  LEUKOCYTESUR TRACE*    Lipid Panel:  No results found for: CHOL, TRIG, HDL, CHOLHDL, VLDL, LDLCALC  HgbA1C: No results found for: HGBA1C  Urine Drug Screen:     Component Value Date/Time   LABOPIA NONE DETECTED 02/07/2017 1051   COCAINSCRNUR NONE DETECTED 02/07/2017  1051   LABBENZ POSITIVE (A) 02/07/2017 1051   AMPHETMU NONE DETECTED 02/07/2017 1051   THCU NONE DETECTED 02/07/2017 1051   LABBARB NONE DETECTED 02/07/2017 1051    Alcohol Level: No results for input(s): ETH in the last 168 hours.   Imaging: Ct Head Wo Contrast  Result Date: 03/12/2017 CLINICAL DATA:  Generalized weakness. Patient not eating or talking normally. EXAM: CT HEAD WITHOUT CONTRAST TECHNIQUE: Contiguous axial images were obtained from the base of the skull through the vertex without intravenous contrast. COMPARISON:  February 23, 2017 FINDINGS: Brain: No subdural, epidural, or subarachnoid hemorrhage. Cerebellum, brainstem, and basal cisterns are normal. No mass effect or midline shift. Ventricles and sulci are stable. White matter changes are stable. No acute cortical ischemia or infarct. No other acute abnormalities identified. Vascular: Calcified atherosclerosis is seen in the intracranial carotid arteries. Skull: Normal. Negative for fracture or focal lesion. Sinuses/Orbits: No acute finding. Other: None. IMPRESSION: No acute intracranial abnormality identified to explain the patient's symptoms. Electronically Signed   By: Gerome Sam III M.D   On: 03/12/2017 09:03   Dg Chest Portable 1 View  Result Date: 03/12/2017 CLINICAL DATA:  Weakness. EXAM: PORTABLE CHEST 1 VIEW COMPARISON:  February 23, 2017 FINDINGS: The heart size and mediastinal contours are within normal limits. Both lungs are clear. The visualized skeletal structures are unremarkable. IMPRESSION: No active disease. Electronically Signed   By: Gerome Sam III M.D   On: 03/12/2017 08:37     Assessment/Plan:  83 y.o. male  with a known history of CAD, Anxiety, Htn, parkinson's dz, Hypothyroidism- lives in assisted living place- sent today as noted to have increased swallowing difficulty. He has been having decreased responsiveness, worsening speech and increased tremor.    Pt is on Sinemet 6 times a days Likely  progressive PD.  At this point focus should be on placement. There is no acute treatment of PD.  Would make sure there is nothing new metabolic promoting current condition.  Not inclined to change his current parkinson's medications specifically since he is on very frequent sinemet dose. Any higher dopamine can cause significant hypotension Would talk to family regarding goals of care.    Pauletta Browns    03/12/2017, 5:27 PM

## 2017-03-12 NOTE — Progress Notes (Signed)
PT Cancellation Note  Patient Details Name: Anthony Blankenship MRN: [id number] DOB: 18-Apr-1925   Cancelled Treatment:    Reason Eval/Treat Not Completed: Other (comment) Attempted evaluation, pt very lethargic upon arrival, would not wake up with voice or tactile cues. Would not follow commands with opening eyes. Appeared deep in sleep. Will re-attempt next available date.   Renford Dills, SPT Renford Dills 03/12/2017, 4:08 PM

## 2017-03-13 DIAGNOSIS — Z87442 Personal history of urinary calculi: Secondary | ICD-10-CM | POA: Diagnosis not present

## 2017-03-13 DIAGNOSIS — G2581 Restless legs syndrome: Secondary | ICD-10-CM | POA: Diagnosis present

## 2017-03-13 DIAGNOSIS — E039 Hypothyroidism, unspecified: Secondary | ICD-10-CM | POA: Diagnosis present

## 2017-03-13 DIAGNOSIS — Z951 Presence of aortocoronary bypass graft: Secondary | ICD-10-CM | POA: Diagnosis not present

## 2017-03-13 DIAGNOSIS — E860 Dehydration: Secondary | ICD-10-CM | POA: Diagnosis present

## 2017-03-13 DIAGNOSIS — E43 Unspecified severe protein-calorie malnutrition: Secondary | ICD-10-CM | POA: Diagnosis present

## 2017-03-13 DIAGNOSIS — Z66 Do not resuscitate: Secondary | ICD-10-CM | POA: Diagnosis present

## 2017-03-13 DIAGNOSIS — Z888 Allergy status to other drugs, medicaments and biological substances status: Secondary | ICD-10-CM | POA: Diagnosis not present

## 2017-03-13 DIAGNOSIS — F419 Anxiety disorder, unspecified: Secondary | ICD-10-CM | POA: Diagnosis present

## 2017-03-13 DIAGNOSIS — Z515 Encounter for palliative care: Secondary | ICD-10-CM | POA: Diagnosis not present

## 2017-03-13 DIAGNOSIS — F329 Major depressive disorder, single episode, unspecified: Secondary | ICD-10-CM | POA: Diagnosis present

## 2017-03-13 DIAGNOSIS — I10 Essential (primary) hypertension: Secondary | ICD-10-CM | POA: Diagnosis present

## 2017-03-13 DIAGNOSIS — R4781 Slurred speech: Secondary | ICD-10-CM | POA: Diagnosis present

## 2017-03-13 DIAGNOSIS — R4182 Altered mental status, unspecified: Secondary | ICD-10-CM | POA: Diagnosis present

## 2017-03-13 DIAGNOSIS — Z8249 Family history of ischemic heart disease and other diseases of the circulatory system: Secondary | ICD-10-CM | POA: Diagnosis not present

## 2017-03-13 DIAGNOSIS — R1312 Dysphagia, oropharyngeal phase: Secondary | ICD-10-CM | POA: Diagnosis not present

## 2017-03-13 DIAGNOSIS — I2510 Atherosclerotic heart disease of native coronary artery without angina pectoris: Secondary | ICD-10-CM | POA: Diagnosis present

## 2017-03-13 DIAGNOSIS — Z79899 Other long term (current) drug therapy: Secondary | ICD-10-CM | POA: Diagnosis not present

## 2017-03-13 DIAGNOSIS — Z6821 Body mass index (BMI) 21.0-21.9, adult: Secondary | ICD-10-CM | POA: Diagnosis not present

## 2017-03-13 DIAGNOSIS — G20 Parkinson's disease: Secondary | ICD-10-CM | POA: Diagnosis not present

## 2017-03-13 LAB — BASIC METABOLIC PANEL
Anion gap: 7 (ref 5–15)
BUN: 33 mg/dL — ABNORMAL HIGH (ref 6–20)
CO2: 26 mmol/L (ref 22–32)
Calcium: 8.7 mg/dL — ABNORMAL LOW (ref 8.9–10.3)
Chloride: 106 mmol/L (ref 101–111)
Creatinine, Ser: 0.99 mg/dL (ref 0.61–1.24)
GFR calc non Af Amer: >60 mL/min (ref >60)
Glucose, Bld: 84 mg/dL (ref 65–99)
Potassium: 3.9 mmol/L (ref 3.5–5.1)
Sodium: 139 mmol/L (ref 135–145)

## 2017-03-13 LAB — CBC
HCT: 37.8 % — ABNORMAL LOW (ref 40.0–52.0)
Hemoglobin: 13.1 g/dL (ref 13.0–18.0)
MCH: 31.7 pg (ref 26.0–34.0)
MCHC: 34.6 g/dL (ref 32.0–36.0)
MCV: 91.5 fL (ref 80.0–100.0)
PLATELETS: 113 10*3/uL — AB (ref 150–440)
RBC: 4.14 MIL/uL — AB (ref 4.40–5.90)
RDW: 12.9 % (ref 11.5–14.5)
WBC: 4.8 10*3/uL (ref 3.8–10.6)

## 2017-03-13 MED ORDER — ENTACAPONE 200 MG PO TABS
200.0000 mg | ORAL_TABLET | ORAL | Status: DC
  Administered 2017-03-13 – 2017-03-16 (×18): 200 mg via ORAL
  Filled 2017-03-13 (×25): qty 1

## 2017-03-13 MED ORDER — CARBIDOPA-LEVODOPA 10-100 MG PO TABS
0.5000 | ORAL_TABLET | ORAL | Status: DC
  Administered 2017-03-13 – 2017-03-16 (×17): 0.5 via ORAL
  Filled 2017-03-13 (×26): qty 0.5

## 2017-03-13 MED ORDER — CARBIDOPA-LEVODOPA 25-100 MG PO TABS
1.0000 | ORAL_TABLET | ORAL | Status: DC
  Administered 2017-03-13 – 2017-03-16 (×17): 1 via ORAL
  Filled 2017-03-13 (×26): qty 1

## 2017-03-13 NOTE — Plan of Care (Signed)
Problem: SLP Dysphagia Goals Goal: Misc Dysphagia Goal Pt will safely tolerate po diet of least restrictive consistency w/ no overt s/s of aspiration noted by Staff/pt/family x3 sessions.    

## 2017-03-13 NOTE — Progress Notes (Signed)
[facility] - [place] at [facility]   PATIENT NAME: Anthony Blankenship    MR#:  [id number]  DATE OF BIRTH:  June 28, 1924 Admitted for Increase swallowing difficulty, decreased responsiveness, worsening tremors. Patient received melatonin, Zoloft early this morning and now he is very sleepy.   CHIEF COMPLAINT:   Chief Complaint  Patient presents with  . Weakness    REVIEW OF SYSTEMS:   Review of Systems  Unable to perform ROS: Mental acuity  Neurological: Negative for sensory change.    DRUG ALLERGIES:   Allergies  Allergen Reactions  . Other Other (See Comments)    Uncoded Allergy. Allergen: Sleeping Medications    VITALS:  Blood pressure (!) 111/97, pulse (!) 49, temperature 97.7 F (36.5 C), temperature source Oral, resp. rate 20, height  (1.778 m), weight 72.6 kg (160 lb), SpO2 100 %.  PHYSICAL EXAMINATION:  GENERAL:  83 y.o.-year-old patient lying in the bed Sleeping.  EYES: Pupils equal, round, reactive to light  . No scleral icterus. Ex HEENT: Head atraumatic, normocephalic. Oropharynx and nasopharynx clear.  NECK:  Supple, no jugular venous distention. No thyroid enlargement, no tenderness.  LUNGS: Normal breath sounds bilaterally, no wheezing, rales,rhonchi or crepitation. No use of accessory muscles of respiration.  CARDIOVASCULAR: S1, S2 normal. No murmurs, rubs, or gallops.  ABDOMEN: Soft, nontender, nondistended. Bowel sounds present. No organomegaly or mass.  EXTREMITIES: No pedal edema, cyanosis, or clubbing.  NEUROLOGIC: heis having extreme intentional tremors, sleeping unable to do full neurological exam.  PSYCHIATRIC: closed his eyes, according to the nurse earlier he was communicating with staff. SKIN: No obvious rash, lesion, or ulcer.    LABORATORY PANEL:   CBC  Recent Labs Lab 03/13/17 0943  WBC 4.8  HGB 13.1  HCT 37.8*  PLT 113*    ------------------------------------------------------------------------------------------------------------------  Chemistries   Recent Labs Lab 03/12/17 0819  NA 142  K 4.1  CL 105  CO2 28  GLUCOSE 101*  BUN 53*  CREATININE 1.33*  CALCIUM 9.9  AST 29  ALT 27  ALKPHOS 46  BILITOT 1.5*   ------------------------------------------------------------------------------------------------------------------  Cardiac Enzymes  Recent Labs Lab 03/12/17 0819  TROPONINI 0.03*   ------------------------------------------------------------------------------------------------------------------  RADIOLOGY:  Ct Head Wo Contrast  Result Date: 03/12/2017 CLINICAL DATA:  Generalized weakness. Patient not eating or talking normally. EXAM: CT HEAD WITHOUT CONTRAST TECHNIQUE: Contiguous axial images were obtained from the base of the skull through the vertex without intravenous contrast. COMPARISON:  February 23, 2017 FINDINGS: Brain: No subdural, epidural, or subarachnoid hemorrhage. Cerebellum, brainstem, and basal cisterns are normal. No mass effect or midline shift. Ventricles and sulci are stable. White matter changes are stable. No acute cortical ischemia or infarct. No other acute abnormalities identified. Vascular: Calcified atherosclerosis is seen in the intracranial carotid arteries. Skull: Normal. Negative for fracture or focal lesion. Sinuses/Orbits: No acute finding. Other: None. IMPRESSION: No acute intracranial abnormality identified to explain the patient's symptoms. Electronically Signed   By: Gerome Sam III M.D   On: 03/12/2017 09:03   Dg Chest Portable 1 View  Result Date: 03/12/2017 CLINICAL DATA:  Weakness. EXAM: PORTABLE CHEST 1 VIEW COMPARISON:  February 23, 2017 FINDINGS: The heart size and mediastinal contours are within normal limits. Both lungs are clear. The visualized skeletal structures are unremarkable. IMPRESSION: No active disease. Electronically Signed   By:  Gerome Sam III M.D   On: 03/12/2017 08:37    EKG:   Orders placed or performed during the hospital encounter of 03/12/17  .  ED EKG  . ED EKG  . EKG 12-Lead  . EKG 12-Lead    ASSESSMENT AND PLAN:   83 year old male patient with slurred speech, worsening tremors, worsening mental status likely secondary to worsening Parkinson disease. Infection workup has been negative. Continue IV fluids because he is nothing by mouth. Once he wakes up he needs to have speech therapy evaluation, physical therapy evaluation and do the final disposition based on that. Spoke with patient's daughter over the phone with patient's daughter over the phone. #33 6-2 7 0-4 [phone]. According to her he usually walks with walker. And also needs help with feeding. She is concerned why he is sleeping more today morning. #2 dehydration: Continue IV fluids, dehydration is corrected. #3 depression: Continue Zoloft. # #4: History of Parkinson disease: Patient is already on Sinemet CR 6 times daily lung with Comtan 6 200 MG 6 times daily. Seen by neurologist. No further work up is recommended.  CODE STATUS DO NOT RESUSCITATE.    All the records are reviewed and case discussed with Care Management/Social Workerr. Management plans discussed with the patient, family and they are in agreement.  CODE STATUS: dnr  TOTAL TIME TAKING CARE OF THIS PATIENT: 35 minutes.   POSSIBLE D/C IN 1-2 DAYS, DEPENDING ON CLINICAL CONDITION.   Katha Hamming M.D on 03/13/2017 at 10:18 AM  Between 7am to 6pm - Pager - [phone]  After 6pm go to [email or web address] - password [email or web address] [facility]  [facility]  Office  [phone]  CC: Primary care physician; Anthony Asal, MD   Note: This dictation was prepared with Dragon dictation along with smaller phrase technology. Any transcriptional errors that result from this process are unintentional.

## 2017-03-13 NOTE — Progress Notes (Signed)
PT Cancellation Note  Patient Details Name: Anthony Blankenship MRN: [id number] DOB: 01/12/1925   Cancelled Treatment:    Reason Eval/Treat Not Completed: Fatigue/lethargy limiting ability to participate (Evaluation re-attempted.  Patient currently sleeping soundly and unable to participate with session.  Will re-attempt at later time/date as medically appropriate and able to actively participate.)  Shashana Fullington H. Manson Passey, PT, DPT, NCS 03/13/17, 9:02 AM [phone]

## 2017-03-13 NOTE — Care Management Obs Status (Signed)
MEDICARE OBSERVATION STATUS NOTIFICATION   Patient Details  Name: LUAY BALDING MRN: [id number] Date of Birth: January 10, 1925   Medicare Observation Status Notification Given:  Yes    Gwenette Greet, RN 03/13/2017, 10:00 AM

## 2017-03-13 NOTE — Progress Notes (Signed)
Initial Nutrition Assessment  DOCUMENTATION CODES:   Severe malnutrition in context of chronic illness  INTERVENTION:  Provide Honey-Thick Mighty Shake po TID with meals, each supplement provides 200 kcal, 7 grams of protein.  Provide Magic cup TID with meals, each supplement provides 290 kcal and 9 grams of protein. Patient prefers chocolate.  Discussed with Patient Microbiologist. Kitchen can send up small cup of chocolate syrup on trays so patient's honey-thick milk and honey-thick mighty shake can be mixed with chocolate syrup.  Recommend providing full assistance at meals.  NUTRITION DIAGNOSIS:   Malnutrition (Severe) related to chronic illness (progression of Parkinson's disease, difficulty with eating) as evidenced by severe depletion of body fat, severe depletion of muscle mass.  GOAL:   Patient will meet greater than or equal to 90% of their needs  MONITOR:   PO intake, Supplement acceptance, Labs, Diet advancement, Weight trends, I & O's  REASON FOR ASSESSMENT:   Malnutrition Screening Tool    ASSESSMENT:   83 year old male with PMHx of kidney stones, HTN, anxiety, depression, CAD, hypothyroidism, Parkinson's disease who [place] [facility] [place] with AMS, worsening speech and swallowing difficulty thought to be due to progressive Parkinson's disease.   -Patient was advanced to dysphagia 1 diet with honey-thick liquids following SLP evaluation.  Spoke with patient at bedside. He is alert and able to respond to questions, but speaks very softly. He also uses his hands to communicate numbers. Patient reports he does not have a good appetite. He reports he has only had one meal since last Tuesday (9/11). He is not sure what is causing his poor appetite. Denies any N/V or abdominal pain, and reports his bowel movements are normal. Patient does have poor dentition. Missing all upper teeth, and with poor dentition of bottom teeth. He reports he does not have  any dentures. He lives at the [facility] and eats in [employer]. Reports he usually finishes about 50% of meals plus will occasionally eat snacks. He does not receive any assistance with eating at the [facility] and has had trouble with feeding himself (reports food will fall off of his fork and spoon). Patient enjoys chocolate milk.  Reports UBW was 204 lbs. Do not see weight as high of 204 lbs in chart. In 2016 patient was approximately 160 lbs. Weight in chart fluctuates between 140s to 160s. Patient currently on low bed, so unsure of accuracy of weight, but it is reading 152 lbs. Patient reports his last weight at the [facility] was 153 lbs.  Medications reviewed and include: levothyroxine, vitamin D 16109 units every 7 days, NS @ 75 ml/hr.  Labs reviewed: BUN 33.  Nutrition-Focused physical exam completed. Findings are severe fat depletion (orbital, upper arm, and thoracic/lumbar regions), severe muscle depletion (moderate depletion of posterior calf region; severe depletion of temple, clavicle, clavicle/acromion bone, scapular bone, dorsal hand, patellar, and anterior thigh regions), and no edema.   Discussed with RN.  Diet Order:  DIET - DYS 1 Room service appropriate? Yes with Assist; Fluid consistency: Honey Thick  Skin:  Wound (see comment) (abrasions to elbows and legs)  Last BM:  PTA (03/08/2017 per chart)  Height:   Ht Readings from Last 1 Encounters:  03/12/17  (1.778 m)    Weight:   Wt Readings from Last 1 Encounters:  03/13/17 152 lb (68.9 kg)    Ideal Body Weight:  75.5 kg  BMI:  Body mass index is 21.81 kg/m.  Estimated Nutritional Needs:  Kcal:  1630-1910 (MSJ x 1.2-1.4)  Protein:  95-105 grams (1.4-1.5 grams/kg)  Fluid:  1.7 L/day (25 ml/kg)  EDUCATION NEEDS:   No education needs identified at this time  Helane Rima, MS, RD, LDN Pager: [phone] After Hours Pager: [phone]

## 2017-03-13 NOTE — Evaluation (Addendum)
Clinical/Bedside Swallow Evaluation Patient Details  Name: Anthony Blankenship MRN: [id number] Date of Birth: [birth year]  Today's Date: 03/13/2017 Time: SLP Start Time (ACUTE ONLY): 1405 SLP Stop Time (ACUTE ONLY): 1505 SLP Time Calculation (min) (ACUTE ONLY): 60 min  Past Medical History:  Past Medical History:  Diagnosis Date  . Anxiety   . Coronary artery disease   . Depression   . Hypertension   . Hypothyroidism   . Kidney stones   . Parkinson's disease (HCC)   . Restless leg syndrome    Past Surgical History:  Past Surgical History:  Procedure Laterality Date  . APPENDECTOMY    . CARDIAC CATHETERIZATION    . CORONARY ARTERY BYPASS GRAFT     HPI:  Pt is a 83 y.o. male with a known history of CAD, Depression, Anxiety, Htn, Parkinson's Dis., Hypothyroidism - lives in assisted living place who sent him today as noted to have swallowing difficulty. Neurology has seen and felt decreased responsiveness, worsening speech and increased tremor could be likely d/t progressive PD; placement needs to be addressed. Pt is reportedly on frequent doses of Sinemet to control the PD per chart/MD notes.    Assessment / Plan / Recommendation Clinical Impression  Pt appears to present w/ oropharyngeal phase dysphagia w/ declined Cognitive status and tremorous UEs and lingual activity. Pt appears at increased risk for aspiration secondary to the oropharyngeal phase dsyphagia noted w/ po trials accepted (mostly Honey consistency liquids via TSP).   Pt was given trials of Honey consistency liquids and purees - no trials of other consistencies assessed d/t the decline in Cognitive status presentation. During trials of Honey consistency liquids via tsp/cup, and purees, pt exhibited moderately slow oral movements for bolus control and A-P transfer w/ audible swallowing - suspected delay in swallow initiation; oral phase slowness for transferring, swallowing, and clearing noted but no throat clearing or  coughing w/ trial consistencies. Pt exhibited min oral residue post trials of puree. Pt appeared to do best w/ Honey consistency liquids as they provided just enough texture w/out being too thick/stiff.  Pt's pharyngeal phase of swallowing is impacted by the suspected delayed swallow initiation as well as d/t the moderate+ oral phase deficits and overall slowness; cognitive decline. This in turn increases pt's risk for aspiration and risk to be unable to safely meet his nutrition/hydration needs. Pt requires full assistance w/ feeding.  Recommend a dysphagia level 1(puree) diet w/ Honey consistency liquids; strict aspiration precautions. Recommend strict monitoring and feeding support. Will discuss the impact of his dysphagia on his overall medical status/health and the potential negative sequelae from possible aspiration w/ MD. ST services will continue to monitor pt's status while admitted; education for pt/family/staff.   SLP Visit Diagnosis: Dysphagia, oropharyngeal phase (R13.12)    Aspiration Risk  Moderate aspiration risk    Diet Recommendation  Dysphagia level 1(puree) w/ Honey consistency liquids; aspiration precautions; feeding support  Medication Administration: Crushed with puree    Other  Recommendations Recommended Consults:  (Dietitian f/u) Oral Care Recommendations: Oral care BID;Staff/trained caregiver to provide oral care Other Recommendations: Order thickener from pharmacy;Prohibited food (jello, ice cream, thin soups);Remove water pitcher;Have oral suction available   Follow up Recommendations Skilled Nursing facility (edcuation; monitoring of status and diet consistency)      Frequency and Duration min 2x/week  2 weeks       Prognosis Prognosis for Safe Diet Advancement: Guarded Barriers to Reach Goals: Severity of deficits      Swallow Study  General Date of Onset: 03/12/17 HPI: Pt is a 83 y.o. male with a known history of CAD, Depression, Anxiety, Htn,  Parkinson's Dis., Hypothyroidism - lives in assisted living place who sent him today as noted to have swallowing difficulty. Neurology has seen and felt decreased responsiveness, worsening speech and increased tremor could be likely d/t progressive PD; placement needs to be addressed. Pt is reportedly on frequent doses of Sinemet to control the PD per chart/MD notes.  Type of Study: Bedside Swallow Evaluation Previous Swallow Assessment: none indicated immediately Diet Prior to this Study:  (unknown) Temperature Spikes Noted: No (wbc 4.8) Respiratory Status: Room air History of Recent Intubation: No Behavior/Cognition: Cooperative;Pleasant mood;Confused;Distractible;Requires cueing (awake but eyes closed) Oral Cavity Assessment: Dry (stickty - min) Oral Care Completed by SLP: Recent completion by staff Oral Cavity - Dentition: Missing dentition (no upper dentition at all) Vision:  (eyes closed - n/a) Self-Feeding Abilities: Total assist Patient Positioning: Upright in bed (open-mouth posture at rest; tremors) Baseline Vocal Quality: Low vocal intensity (shaky w/ few words given) Volitional Cough: Cognitively unable to elicit Volitional Swallow: Unable to elicit (but does c/o some discomfort when swallowing; "some")    Oral/Motor/Sensory Function Overall Oral Motor/Sensory Function: Mild impairment Facial Symmetry: Within Functional Limits Lingual ROM:  (tremorous) Lingual Symmetry: Within Functional Limits Velum:  (CNT) Mandible: Within Functional Limits (to resistance)   Ice Chips Ice chips: Not tested   Thin Liquid Thin Liquid: Not tested    Nectar Thick Nectar Thick Liquid: Not tested   Honey Thick Honey Thick Liquid: Impaired Presentation: Spoon (fed; ~3 ozs total) Oral Phase Impairments: Reduced labial seal;Reduced lingual movement/coordination;Poor awareness of bolus Oral Phase Functional Implications: Prolonged oral transit (reduced control) Pharyngeal Phase Impairments:  Suspected delayed Swallow;Decreased hyoid-laryngeal movement;Multiple swallows   Puree Puree: Impaired Presentation: Spoon (fed; 1 trial) Oral Phase Impairments: Reduced labial seal;Reduced lingual movement/coordination;Poor awareness of bolus Oral Phase Functional Implications: Prolonged oral transit Pharyngeal Phase Impairments:  (none) Other Comments: ecpectorated some of bolus and did not want further of it   Solid   GO   Solid: Not tested    Functional Assessment Tool Used: clinical judgement Functional Limitations: Swallowing Swallow Current Status (Q6578): At least 40 percent but less than 60 percent impaired, limited or restricted Swallow Goal Status [phone]): At least 40 percent but less than 60 percent impaired, limited or restricted Swallow Discharge Status [phone]): At least 40 percent but less than 60 percent impaired, limited or restricted   [name], MS, [name] Watson,Katherine 03/13/2017,3:24 PM

## 2017-03-13 NOTE — Progress Notes (Signed)
Pt is very sleepy and does not want to cooperate with meds and neuro checks this am.

## 2017-03-14 DIAGNOSIS — R1312 Dysphagia, oropharyngeal phase: Secondary | ICD-10-CM

## 2017-03-14 DIAGNOSIS — Z515 Encounter for palliative care: Secondary | ICD-10-CM

## 2017-03-14 MED ORDER — QUETIAPINE FUMARATE 25 MG PO TABS
25.0000 mg | ORAL_TABLET | Freq: Every day | ORAL | Status: DC
  Administered 2017-03-14: 25 mg via ORAL
  Filled 2017-03-14: qty 1

## 2017-03-14 NOTE — Evaluation (Signed)
Physical Therapy Evaluation Patient Details Name: Anthony Blankenship MRN: [id number] DOB: Nov 11, 1924 Today's Date: 03/14/2017   History of Present Illness  presented to ER from ALF with progressive weakness; admitted with AMS, worsening of Parkinson's disease.  Clinical Impression  Upon evaluation, patient alert (with encouragement) and oriented to self; follows simple commands with increased time for processing/initiation.  Bilat UE/LE generally weak and deconditioned, but functional for basic transfers and mobility.  Limited truncal dissociation, poor thoracic/lumbar extension; persistent posterior weight shift/trunk lean with all upright posturing.  Currently requiring mod assist for bed mobility, sit/stand, basic transfers and short-distance gait (5') with RW.  Poor standing balance/reactions; requires hands-on assist at all times to manage walker, balance and prevent fall. Would benefit from skilled PT to address above deficits and promote optimal return to PLOF; recommend transition to STR upon discharge from acute hospitalization.     Follow Up Recommendations SNF    Equipment Recommendations       Recommendations for Other Services       Precautions / Restrictions Precautions Precautions: Fall Restrictions Weight Bearing Restrictions: No      Mobility  Bed Mobility Overal bed mobility: Needs Assistance Bed Mobility: Supine to Sit     Supine to sit: Mod assist     General bed mobility comments: assist for LE management, truncal elevation; hand-over-hand assist to initiate movement  Transfers Overall transfer level: Needs assistance Equipment used: Rolling walker (2 wheeled) Transfers: Sit to/from Stand Sit to Stand: Mod assist         General transfer comment: assist for forward trunk lean, lumbar extension and anterior weight translation; excessive posterior weight shift; poor standing balance  Ambulation/Gait Ambulation/Gait assistance: Mod  assist Ambulation Distance (Feet): 5 Feet Assistive device: Rolling walker (2 wheeled)       General Gait Details: very short, shuffling steps (very festinating) with poor balance.  Excessive posterior weight shift; requires +1 assist at all times for safety.  Stairs            Wheelchair Mobility    Modified Rankin (Stroke Patients Only)       Balance Overall balance assessment: Needs assistance Sitting-balance support: No upper extremity supported;Feet supported Sitting balance-Leahy Scale: Fair     Standing balance support: Bilateral upper extremity supported Standing balance-Leahy Scale: Poor                               Pertinent Vitals/Pain Pain Assessment: No/denies pain    Home Living Family/patient expects to be discharged to:: Assisted living               Home Equipment: Wheelchair - manual      Prior Function Level of Independence: Needs assistance         Comments: Per facility representative, patient ambulatory for short distances pushing personal (manual WC); assist for toileting, ADLs.  Ambulates to/from dining hall for some meals, staff brings to room otherwise. Does report at least 5 falls in previous six months.     Hand Dominance        Extremity/Trunk Assessment   Upper Extremity Assessment Upper Extremity Assessment: Generalized weakness (bilat UEs grossly 3-/5 throughout, L shoulder elevation limited (arthritic changes?).)    Lower Extremity Assessment Lower Extremity Assessment: Generalized weakness (grossly 3-/5 throughout, supports body weight without difficulty; unable to reach full bilat hip/knee extension due to muscle tightening)    Cervical / Trunk Assessment Cervical / Trunk  Assessment:  (limited thoracic/lumbar mobility; poor dissociation, increased rigidity throughout spine/trunk)  Communication   Communication:  (limited articulation, poor phonation; difficulty managing oral secretions at times)   Cognition Arousal/Alertness: Lethargic Behavior During Therapy: Flat affect Overall Cognitive Status: Difficult to assess                                 General Comments: oriented to self; follows simple commands with increased time for processing/initiation      General Comments      Exercises     Assessment/Plan    PT Assessment Patient needs continued PT services  PT Problem List Decreased strength;Decreased range of motion;Decreased activity tolerance;Decreased balance;Decreased mobility;Decreased coordination;Decreased cognition;Decreased knowledge of use of DME;Decreased safety awareness;Decreased knowledge of precautions       PT Treatment Interventions DME instruction;Gait training;Functional mobility training;Therapeutic activities;Therapeutic exercise;Balance training;Neuromuscular re-education;Cognitive remediation;Patient/family education    PT Goals (Current goals can be found in the Care Plan section)  Acute Rehab PT Goals Patient Stated Goal: to eat breakfast PT Goal Formulation: With patient Time For Goal Achievement: 03/28/17 Potential to Achieve Goals: Fair    Frequency Min 2X/week   Barriers to discharge Decreased caregiver support      Co-evaluation               AM-PAC PT "6 Clicks" Daily Activity  Outcome Measure Difficulty turning over in bed (including adjusting bedclothes, sheets and blankets)?: Unable Difficulty moving from lying on back to sitting on the side of the bed? : Unable Difficulty sitting down on and standing up from a chair with arms (e.g., wheelchair, bedside commode, etc,.)?: Unable Help needed moving to and from a bed to chair (including a wheelchair)?: A Lot Help needed walking in hospital room?: A Lot Help needed climbing 3-5 steps with a railing? : A Lot 6 Click Score: 9    End of Session Equipment Utilized During Treatment: Gait belt Activity Tolerance: Patient tolerated treatment well Patient left:  in chair;with call bell/phone within reach;with chair alarm set Nurse Communication: Mobility status PT Visit Diagnosis: Difficulty in walking, not elsewhere classified (R26.2);Muscle weakness (generalized) (M62.81)    Time: 1610-9604 PT Time Calculation (min) (ACUTE ONLY): 22 min   Charges:   PT Evaluation $PT Eval Moderate Complexity: 1 Mod     PT G Codes:   PT G-Codes **NOT FOR INPATIENT CLASS** Functional Assessment Tool Used: AM-PAC 6 Clicks Basic Mobility Functional Limitation: Mobility: Walking and moving around Mobility: Walking and Moving Around Current Status (V4098): At least 60 percent but less than 80 percent impaired, limited or restricted Mobility: Walking and Moving Around Goal Status [phone]): At least 20 percent but less than 40 percent impaired, limited or restricted    Deland Slocumb H. Manson Passey, PT, DPT, NCS 03/14/17, 9:47 AM [phone]

## 2017-03-14 NOTE — Progress Notes (Signed)
Please note. Patient is currently followed by Palliative NP at [facility]F. Palliative would continue at SNF if that is where he discharges to. CSW Mordecai Rasmussen made aware. Discussed with Hospital Palliative [facility]. Thank you. Dayna Barker RN, BSN, [facility] [place], hospital Liaison [phone]

## 2017-03-14 NOTE — Clinical Social Work Placement (Signed)
   CLINICAL SOCIAL WORK PLACEMENT  NOTE  Date:  03/14/2017  Patient Details  Name: Anthony Blankenship MRN: [id number] Date of Birth: Sep 06, 1924  Clinical Social Work is seeking post-discharge placement for this patient at the Skilled  Nursing Facility level of care (*CSW will initial, date and re-position this form in  chart as items are completed):  Yes   Patient/family provided with [facility] Clinical Social Work Department's list of facilities offering this level of care within the geographic area requested by the patient (or if unable, by the patient's family).  Yes   Patient/family informed of their freedom to choose among providers that offer the needed level of care, that participate in Medicare, Medicaid or managed care program needed by the patient, have an available bed and are willing to accept the patient.  Yes   Patient/family informed of [facility]'s ownership interest in [facility] and [facility], as well as of the fact that they are under no obligation to receive care at these facilities.  PASRR submitted to EDS on       PASRR number received on       Existing PASRR number confirmed on 03/14/17     FL2 transmitted to all facilities in geographic area requested by pt/family on 03/14/17     FL2 transmitted to all facilities within larger geographic area on       Patient informed that his/her managed care company has contracts with or will negotiate with certain facilities, including the following:            Patient/family informed of bed offers received.  Patient chooses bed at       Physician recommends and patient chooses bed at      Patient to be transferred to   on  .  Patient to be transferred to facility by       Patient family notified on   of transfer.  Name of family member notified:        PHYSICIAN Please sign DNR, Please sign FL2     Additional Comment:    _______________________________________________ Raye Sorrow,  LCSW 03/14/2017, 10:15 AM

## 2017-03-14 NOTE — Progress Notes (Signed)
[facility] [place] [facility]   PATIENT NAME: Anthony Blankenship    MR#:  [id number]  DATE OF BIRTH:  March 24, 1925 Admitted for  patient is alert and he does not want to go to [place]. And told repeatedly the same thing to  Nurse also. Waiting for physical therapy evaluation for final disposition.   CHIEF COMPLAINT:   Chief Complaint  Patient presents with  . Weakness    REVIEW OF SYSTEMS:   Review of Systems  Unable to perform ROS: Mental acuity  Neurological: Negative for sensory change.    DRUG ALLERGIES:   Allergies  Allergen Reactions  . Other Other (See Comments)    Uncoded Allergy. Allergen: Sleeping Medications    VITALS:  Blood pressure 128/65, pulse 62, temperature 98.4 F (36.9 C), temperature source Oral, resp. rate 18, height  (1.778 m), weight 68.9 kg (152 lb), SpO2 100 %.  PHYSICAL EXAMINATION:  GENERAL:  83 y.o.-year-old patient lying in the bed shaking non stop due to PD.  EYES: Pupils equal, round, reactive to light  . No scleral icterus. Ex HEENT: Head atraumatic, normocephalic. Oropharynx and nasopharynx clear.  NECK:  Supple, no jugular venous distention. No thyroid enlargement, no tenderness.  LUNGS: Normal breath sounds bilaterally, no wheezing, rales,rhonchi or crepitation. No use of accessory muscles of respiration.  CARDIOVASCULAR: S1, S2 normal. No murmurs, rubs, or gallops.  ABDOMEN: Soft, nontender, nondistended. Bowel sounds present. No organomegaly or mass.  EXTREMITIES: No pedal edema, cyanosis, or clubbing.  NEUROLOGIC: heis having extreme intentional tremors, sleeping unable to do full neurological exam.  PSYCHIATRIC: Alert and mumbles. SKIN: No obvious rash, lesion, or ulcer.    LABORATORY PANEL:   CBC  Recent Labs Lab 03/13/17 0943  WBC 4.8  HGB 13.1  HCT 37.8*  PLT 113*    ------------------------------------------------------------------------------------------------------------------  Chemistries   Recent Labs Lab 03/12/17 0819 03/13/17 0943  NA 142 139  K 4.1 3.9  CL 105 106  CO2 28 26  GLUCOSE 101* 84  BUN 53* 33*  CREATININE 1.33* 0.99  CALCIUM 9.9 8.7*  AST 29  --   ALT 27  --   ALKPHOS 46  --   BILITOT 1.5*  --    ------------------------------------------------------------------------------------------------------------------  Cardiac Enzymes  Recent Labs Lab 03/12/17 0819  TROPONINI 0.03*   ------------------------------------------------------------------------------------------------------------------  RADIOLOGY:  No results found.  EKG:   Orders placed or performed during the hospital encounter of 03/12/17  . ED EKG  . ED EKG  . EKG 12-Lead  . EKG 12-Lead    ASSESSMENT AND PLAN:   83 year old male patient with slurred speech, worsening tremors, worsening mental status likely secondary to worsening Parkinson disease. Infection workup has been negative. Pending physical therapy evaluation. For final disposition. Speech therapy recommended dysphagia 1 diet. Spoke with patient's daughter over the phone yesterday . #33 6-2 7 0-4 [phone]. According to her he usually walks with walker.  #2 dehydration: Received IV fluids. . #3 depression: Continue Zoloft.  #4: History of Parkinson disease: adjusted Parkinson medicines as per Dr. Malvin Johns neurologist recommendation, patient followed with him recently.  CODE STATUS DO NOT RESUSCITATE.    All the records are reviewed and case discussed with Care Management/Social Workerr. Management plans discussed with the patient, family and they are in agreement.  CODE STATUS: dnr  TOTAL TIME TAKING CARE OF THIS PATIENT: 35 minutes.   POSSIBLE D/C IN 1-2 DAYS, DEPENDING ON CLINICAL CONDITION.   Katha Hamming M.D on 03/14/2017 at 9:39 AM  Between 7am to 6pm - Pager -  [phone]  After 6pm go to [email or web address] - password [email or web address] [facility]  [facility]  Office  [phone]  CC: Primary care physician; Royetta Asal, MD   Note: This dictation was prepared with Dragon dictation along with smaller phrase technology. Any transcriptional errors that result from this process are unintentional.

## 2017-03-14 NOTE — NC FL2 (Addendum)
[place] MEDICAID FL2 LEVEL OF CARE SCREENING TOOL     IDENTIFICATION  Patient Name: Anthony Blankenship Birthdate: 26-Nov-1924 Sex: male Admission Date (Current Location): 03/12/2017  [facility] and [place] Number:  Chiropodist and Address:  [facility], [address] [place], [place] [address]      Provider Number: [id number]  Attending Physician Name and Address:  Katha Hamming, MD  Relative Name and Phone Number:       Current Level of Care: Hospital Recommended Level of Care: ALF Prior Approval Number:    Date Approved/Denied:   PASRR Number: [id number] Plan: ALF    Current Diagnoses: Patient Active Problem List   Diagnosis Date Noted  . Altered mental status 02/08/2017  . Frailty 12/14/2016  . Orthostatic hypotension 12/14/2016  . Parkinson's disease (HCC)   . Palliative care by specialist   . Goals of care, counseling/discussion   . Gait instability 09/13/2016  . Recurrent falls 09/13/2016  . Coronary artery disease involving native coronary artery of native heart with angina pectoris (HCC) 09/08/2016  . Near syncope 06/12/2016  . Lower GI bleed 04/25/2016  . GI bleed 04/25/2016  . Restlessness 05/14/2015    Orientation RESPIRATION BLADDER Height & Weight     Self, Situation, Place  Normal Incontinent Weight: 152 lb (68.9 kg) (bed scale) Height:   (177.8 cm)  BEHAVIORAL SYMPTOMS/MOOD NEUROLOGICAL BOWEL NUTRITION STATUS      Incontinent Diet   Dysphagia level 1(puree) w/ Honey consistency liquids; aspiration precautions; feeding support          AMBULATORY STATUS COMMUNICATION OF NEEDS Skin   Extensive Assist Verbally Normal                       Personal Care Assistance Level of Assistance  Bathing, Feeding, Dressing Bathing Assistance: Maximum assistance Feeding assistance: Maximum assistance Dressing Assistance: Maximum assistance Total Care Assistance: Limited assistance   Functional  Limitations Info  Sight, Hearing, Speech Sight Info: Impaired Hearing Info: Impaired Speech Info: Impaired    SPECIAL CARE FACTORS FREQUENCY                   Contractures Contractures Info: Not present    Additional Factors Info  Code Status, Allergies Code Status Info: DNR Allergies Info: Sleeping Medications            Medication List     STOP taking these medications   guaifenesin 100 MG/5ML syrup Commonly known as:  ROBITUSSIN   QUEtiapine 25 MG tablet Commonly known as:  SEROQUEL     TAKE these medications   acetaminophen 325 MG tablet Commonly known as:  TYLENOL Take 650 mg by mouth every 6 (six) hours as needed for mild pain.   ASPERCREME W/LIDOCAINE 4 % cream Generic drug:  lidocaine Apply 1 application topically 3 (three) times daily as needed.   barrier cream Crea Commonly known as:  non-specified Apply 1 application topically as needed.   carbidopa-levodopa-entacapone 31.25-125-200 MG tablet Commonly known as:  STALEVO Take 1 tablet by mouth See admin instructions. Take one tablet by mouth seven times daily   diazepam 2 MG tablet Commonly known as:  VALIUM Take 1 tablet (2 mg total) by mouth every 12 (twelve) hours as needed for anxiety.   diclofenac sodium 1 % Gel Commonly known as:  VOLTAREN Apply 1 g topically 3 (three) times daily.   diphenhydrAMINE 25 MG tablet Commonly known as:  BENADRYL Take 25 mg  by mouth every 6 (six) hours as needed.   levothyroxine 75 MCG tablet Commonly known as:  SYNTHROID, LEVOTHROID Take 75 mcg by mouth daily.   loperamide 2 MG tablet Commonly known as:  IMODIUM A-D Take 2 mg by mouth as needed for diarrhea or loose stools. Do not exceed 8 doses in 24 hours   magnesium hydroxide 400 MG/5ML suspension Commonly known as:  MILK OF MAGNESIA Take 30 mLs by mouth daily as needed for mild constipation.   Melatonin 3 MG Tabs Take 1 tablet by mouth at bedtime.   primidone 50 MG  tablet Commonly known as:  MYSOLINE Take 50 mg by mouth at bedtime.   senna-docusate 8.6-50 MG tablet Commonly known as:  Senokot-S Take 1 tablet by mouth at bedtime as needed for mild constipation.   sertraline 50 MG tablet Commonly known as:  ZOLOFT Take 1 tablet (50 mg total) by mouth daily.   traZODone 50 MG tablet Commonly known as:  DESYREL Take 25 mg by mouth at bedtime.   Vitamin D (Ergocalciferol) 50000 units Caps capsule Commonly known as:  DRISDOL Take 50,000 Units by mouth every 7 (seven) days.     York Spaniel MSW,LCSW

## 2017-03-14 NOTE — Consult Note (Signed)
Consultation Note Date: 03/14/2017   Patient Name: Anthony Blankenship  DOB: 06-Jan-1925  MRN: [id number]  Age / Sex: 83 y.o., male  PCP: Sarajane Jews, MD Referring Physician: Epifanio Lesches, MD  Reason for Consultation: Establishing goals of care  HPI/Patient Profile: 83 y.o. male  with past medical history of severe Parkinsons, CAD, thyroid disease, depression, restless leg and multiple falls who was admitted from ALF on 03/12/2017 with altered mental status and swallowing difficulties. He has no evidence of infection and his CT head was negative for acute changes.  Neurology evaluated him and felt that his issues were due to progressive Parkinsons.  Medications were not changed as he is already on Sinemet 6x a day.  He has had 3 admissions and 5 ED visits in the last 6 months.  He is followed outpatient by Palliative Care.    Speech therapy evaluated him and found that both his pharyngeal and oropharyngeal phases of swallowing have deficits related to Parkinsons.  Outpatient notes indicate that he weighs about the same now (160) that he did in July.  Clinical Assessment and Goals of Care:  I have reviewed medical records including EPIC notes, labs and imaging, received report from the care team, assessed the patient.    The patient and his daughter met with Palliative care during his admission in 08/2016.  The patient choose not to elect and HCPOA.  He was firm in his decisions for DNR/DNI and no feeding tube.    The patient has a very low voice and is difficult to understand.  I spoke to him today.  He tells me he is hungry.  He is not in any pain.  He told me that he came to the hospital for difficulty eating.  He also clearly told me he does not want to return to the hospital.   He directed me to speak with his daughter.  I tried to call the patient's 3x and received a busy signal each time.   Primary  Decision Maker:  PATIENT and daughter.    SUMMARY OF RECOMMENDATIONS    If patient is discharged please clearly write in DC summary that he is to be followed by Palliative Care outpatient. If I can reach the daughter I will review a MOST form with her.  I believe that form will be of benefit to [name].  He understands he has Parkinsons.  I believe he understands that his decline is due to Parkinsons. It seems reasonable not to return to the hospital for swallowing difficulties / slurred words.    He has rapid disease progression with barely intelligible speech.  He is requiring a dysphagia 1 diet.  He needs major assistance with ADLs.  He has had dehydration and refuses artificial feeding.  He is very close to qualifying/if not already qualified for Hospice services.  He does not have documented weight loss.  I will request a standing weight on him here in the hospital.  Code Status/Advance Care Planning:  DNR/DNI  No feeding tube  Symptom Management:   Per primary team.   Prognosis: months given advanced Parkinsons disease    Discharge Planning: [facility] for rehab with Palliative care service follow-up      Primary Diagnoses: Present on Admission: . Altered mental status   I have reviewed the medical record, interviewed the patient and family, and examined the patient. The following aspects are pertinent.  Past Medical History:  Diagnosis Date  . Anxiety   . Coronary artery disease   . Depression   . Hypertension   . Hypothyroidism   . Kidney stones   . Parkinson's disease ([place])   . Restless leg syndrome    Social History   Social History  . Marital status: Divorced    Spouse name: N/A  . Number of children: N/A  . Years of education: N/A   Occupational History  . retired    Social History Main Topics  . Smoking status: Never Smoker  . Smokeless tobacco: Never Used  . Alcohol use No  . Drug use: No  . Sexual activity: No    Other Topics Concern  . None   Social History Narrative  . None   Family History  Problem Relation Age of Onset  . Liver disease Father   . Heart disease Mother   . Kidney disease Neg Hx   . Prostate cancer Neg Hx   . Bladder Cancer Neg Hx    Scheduled Meds: . carbidopa-levodopa  1 tablet Oral 7 times per day   And  . carbidopa-levodopa  0.5 tablet Oral 7 times per day   And  . entacapone  200 mg Oral 7 times per day  . heparin  5,000 Units Subcutaneous Q8H  . levothyroxine  75 mcg Oral Daily  . mouth rinse  15 mL Mouth Rinse BID  . Melatonin  5 mg Oral QHS  . primidone  50 mg Oral QHS  . QUEtiapine  25 mg Oral QHS  . sertraline  50 mg Oral Daily  . sodium chloride flush  3 mL Intravenous Q12H  . traZODone  50 mg Oral QHS  . Vitamin D (Ergocalciferol)  50,000 Units Oral Q7 days   Continuous Infusions: PRN Meds:.acetaminophen, diazepam, docusate sodium, guaifenesin, loperamide, senna-docusate, sodium chloride flush Allergies  Allergen Reactions  . Other Other (See Comments)    Uncoded Allergy. Allergen: Sleeping Medications   Review of Systems patient barely able to speak  Physical Exam  Very thin, frail, elderly gentleman who wakes easily from sleep. Pleasant.   Voice is very low and difficult to understand. CV rrr Resp no distress Abdomen thin, soft, nt Significant bilateral upper extremity tremor noted.  Vital Signs: BP 111/77 (BP Location: Right Arm)   Pulse 64   Temp 97.6 F (36.4 C) (Oral)   Resp 18   Ht 5' 10"  (1.778 m)   Wt 68.9 kg (152 lb) Comment: bed scale  SpO2 99%   BMI 21.81 kg/m  Pain Assessment: No/denies pain       SpO2: SpO2: 99 % O2 Device:SpO2: 99 % O2 Flow Rate: .O2 Flow Rate (L/min): 0 L/min  IO: Intake/output summary:  Intake/Output Summary (Last 24 hours) at 03/14/17 1600 Last data filed at 03/14/17 0600  Gross per 24 hour  Intake             2125 ml  Output                0 ml  Net  2125 ml    LBM: Last  BM Date: 03/14/17 Baseline Weight: Weight: 72.6 kg (160 lb) Most recent weight: Weight: 68.9 kg (152 lb) (bed scale)     Palliative Assessment/Data:     Time In: 3:20 Time Out: 4:30 Time Total: 50 min. Greater than 50%  of this time was spent counseling and coordinating care related to the above assessment and plan.  Signed by: Florentina Jenny, PA-C Palliative Medicine Pager: [phone]  Please contact Palliative Medicine Team phone at [phone] for questions and concerns.  For individual provider: See Shea Evans

## 2017-03-14 NOTE — Progress Notes (Signed)
LCSW following for discharge planning and disposition:  Physical therapy has evaluated patient and feel he is not at baseline and needing short term rehab in nursing facility. LCSW discussed with patient and he is agreeable.  He is unclear of his preference for bed, thus LCSW will refer out and follow up with bed offers.  Patient's daughter called as requested in chart to be updated. Discussed new recommendation of SNF.  Daughter agreeable patient cannot return in current condition, but feels he would want to return to the [facility].  She reports to confirm with him regarding plans, as listed above.  Patient agreeable for SNF. Will complete SNF work up and follow up with bed offers. Patient was in observation status, however changed to inpatient 9/17.  He requires 2 more midnight stays in effort to transition to SNF.  Deretha Emory, MSW Clinical Social Work: Optician, dispensing Coverage for :  [phone]

## 2017-03-14 NOTE — Progress Notes (Signed)
  Speech Language Pathology Treatment: Dysphagia  Patient Details Name: Anthony Blankenship MRN: [id number] DOB: May 06, 1925 Today's Date: 03/14/2017 Time: 4098-1191 SLP Time Calculation (min) (ACUTE ONLY): 40 min  Assessment / Plan / Recommendation Clinical Impression  Pt continue to present w/ moderate+ oropharyngeal phase dysphagia w/ declined Cognitive status and tremorous UEs and lingual activity. Pt appears at increased risk for aspiration secondary to the oropharyngeal phase dsyphagia noted w/ all po trials given(mostly Honey consistency liquids via TSP, cup). Pt exhibited slight-min anterior leakage/drooling as po tasks were initiated - suspect d/t the stimulation of saliva from the OJ trials consumed w/ NSG and the decreased oral strength to maintain a closed mouth posture w/ reduced involuntary swallowing to clear saliva. During trials of Nectar liquids via tsp/cup, pt exhibited moderately slow oral movements for bolus control and A-P transfer w/ audible swallowing - delayed throat clearing noted x2 trials. When given trials of Honey consistency liquids and purees, continued oral phase slowness for transferring, swallowing, and clearing noted but no throat clearing or coughing and audible swallowing was less. Pt exhibited min oral residue post trials of puree and reduced mastication movements for bolus management of a thicker puree(puree eggs). Pt appeared to do best w/ Honey consistency liquids as they provided just enough texture w/out being too thick/stiff.  Pt's pharyngeal phase of swallowing is impacted by the suspected delayed swallow initiation as well as d/t the moderate+ oral phase deficits and overall slowness. This in turn increases pt's risk for aspiration and risk to be unable to safely meet his nutrition/hydration needs. Pt requires full assistance w/ feeding.  Recommend continue w/ current dysphagia diet as ordered; strict aspiration precautions. Recommend a Palliative Care consult to  discuss the impact of his dysphagia on his overall medical status/health and the potential negative sequelae from possible aspiration. ST services will continue to monitor pt's status while admitted; education for pt/family/staff.      HPI HPI: Pt is a 83 y.o. male with a known history of CAD, Depression, Anxiety, Htn, Parkinson's Dis., Hypothyroidism - lives in assisted living place who sent him today as noted to have swallowing difficulty. Neurology has seen and felt decreased responsiveness, worsening speech and increased tremor could be likely d/t progressive PD; placement needs to be addressed. Pt is reportedly on frequent doses of Sinemet to control the PD per chart/MD notes.       SLP Plan  Continue with current plan of care       Recommendations  Diet recommendations: Dysphagia 1 (puree);Honey-thick liquid Liquids provided via: Teaspoon;Cup;No straw Medication Administration: Crushed with puree Supervision: Staff to assist with self feeding;Full supervision/cueing for compensatory strategies Compensations: Minimize environmental distractions;Slow rate;Small sips/bites;Lingual sweep for clearance of pocketing;Multiple dry swallows after each bite/sip;Follow solids with liquid Postural Changes and/or Swallow Maneuvers: Seated upright 90 degrees;Upright 30-60 min after meal                General recommendations:  (Palliative care consult indicated; Dietician f/u) Oral Care Recommendations: Oral care BID;Staff/trained caregiver to provide oral care Follow up Recommendations: Skilled Nursing facility (TBD) SLP Visit Diagnosis: Dysphagia, oropharyngeal phase (R13.12) Plan: Continue with current plan of care       GO               Anthony Som, MS, [name] Blankenship,Anthony 03/14/2017, 1:59 PM

## 2017-03-15 ENCOUNTER — Inpatient Hospital Stay: Payer: Medicare Other

## 2017-03-15 LAB — BLOOD GAS, ARTERIAL
ACID-BASE EXCESS: 2.7 mmol/L — AB (ref 0.0–2.0)
Bicarbonate: 27.9 mmol/L (ref 20.0–28.0)
FIO2: 0.21
O2 SAT: 94.6 %
PCO2 ART: 44 mmHg (ref 32.0–48.0)
PH ART: 7.41 (ref 7.350–7.450)
PO2 ART: 73 mmHg — AB (ref 83.0–108.0)
Patient temperature: 37

## 2017-03-15 LAB — GLUCOSE, CAPILLARY: Glucose-Capillary: 84 mg/dL (ref 65–99)

## 2017-03-15 MED ORDER — SODIUM CHLORIDE 0.9 % IV SOLN
INTRAVENOUS | Status: DC
  Administered 2017-03-15 – 2017-03-16 (×2): via INTRAVENOUS

## 2017-03-15 NOTE — Progress Notes (Signed)
Spoke with patient's daughter again today. Findings a blood gas, chest x-ray, head CAT scan results. She told me that she is going out to help a friend but does not want to get cell phone number. At this time continue to monitor closely and discontinue Seroquel and see if it helps him to be more awake and alert today and tomorrow.

## 2017-03-15 NOTE — Progress Notes (Addendum)
[facility] [place] [facility]   PATIENT NAME: Anthony Blankenship    MR#:  [id number]  DATE OF BIRTH:  06-18-1925 Admitted for  nurse concerned About unresponsiveness, not taking, bradycardia. Patient has been sleeping during the daytime and wakes up usually in the afternoon. I will get abg, chest x-ray just to make sure there is no infection. I saw him for the last 2 days and usually he sleeps during daytime in the mornings and wakes up around noon.  CHIEF COMPLAINT:   Chief Complaint  Patient presents with  . Weakness    REVIEW OF SYSTEMS:   Review of Systems  Unable to perform ROS: Mental acuity  Neurological: Negative for sensory change.    DRUG ALLERGIES:   Allergies  Allergen Reactions  . Other Other (See Comments)    Uncoded Allergy. Allergen: Sleeping Medications    VITALS:  Blood pressure (!) 149/73, pulse (!) 50, temperature 97.9 F (36.6 C), temperature source Axillary, resp. rate 13, height  (1.778 m), weight 68.9 kg (152 lb), SpO2 100 %.  PHYSICAL EXAMINATION:  GENERAL:  83 y.o.-year-old patient lying in the bed shaking non stop due to PD.  EYES: Pupils equal, round, reactive to light  . No scleral icterus. Ex HEENT: Head atraumatic, normocephalic. Oropharynx and nasopharynx clear.  NECK:  Supple, no jugular venous distention. No thyroid enlargement, no tenderness.  LUNGS: Normal breath sounds bilaterally, no wheezing, rales,rhonchi or crepitation. No use of accessory muscles of respiration.  CARDIOVASCULAR: S1, S2 normal. No murmurs, rubs, or gallops.  ABDOMEN: Soft, nontender, nondistended. Bowel sounds present. No organomegaly or mass.  EXTREMITIES: No pedal edema, cyanosis, or clubbing.  NEUROLOGIC: heis having extreme intentional tremors, sleeping unable to do full neurological exam.  PSYCHIATRIC: Alert and mumbles. SKIN: No obvious rash, lesion, or ulcer.    LABORATORY PANEL:   CBC  Recent Labs Lab 03/13/17 0943   WBC 4.8  HGB 13.1  HCT 37.8*  PLT 113*   ------------------------------------------------------------------------------------------------------------------  Chemistries   Recent Labs Lab 03/12/17 0819 03/13/17 0943  NA 142 139  K 4.1 3.9  CL 105 106  CO2 28 26  GLUCOSE 101* 84  BUN 53* 33*  CREATININE 1.33* 0.99  CALCIUM 9.9 8.7*  AST 29  --   ALT 27  --   ALKPHOS 46  --   BILITOT 1.5*  --    ------------------------------------------------------------------------------------------------------------------  Cardiac Enzymes  Recent Labs Lab 03/12/17 0819  TROPONINI 0.03*   ------------------------------------------------------------------------------------------------------------------  RADIOLOGY:  No results found.  EKG:   Orders placed or performed during the hospital encounter of 03/12/17  . ED EKG  . ED EKG  . EKG 12-Lead  . EKG 12-Lead  . EKG 12-Lead  . EKG 12-Lead    ASSESSMENT AND PLAN:   83 year old male patient with slurred speech, worsening tremors, worsening mental status likely secondary to worsening Parkinson disease. Infection workup has been negative. Continue dysphagia 1 diet, physical therapy recommended skilled nursing, patient needs to stay. Unresponsiveness today morning, relative bradycardia, patient heart rate has been 50s and he is not on beta blockers. Monitor closely, patient will get chest x-ray, ABG. Patient has been having pulse around 50 ,reviewed Dr. Daisy Blossom note recently. The patient has bradycardia, that is not new. Spoke with patient's daughter over the phone yesterday . #33 6-2 7 0-4 853. According to her he usually walks with walker.  #2 dehydration: Received IV fluids. . #3 depression: Continue Zoloft.  #4: History of Parkinson  disease: adjusted Parkinson medicines as per Dr. Malvin Johns neurologist recommendation, patient followed with him recently.  CODE STATUS DO NOT RESUSCITATE. prognosis poor due to advanced age,  severe Parkinson disease.  All the records are reviewed and case discussed with Care Management/Social Workerr. Management plans discussed with the patient, family and they are in agreement.  CODE STATUS: dnr  TOTAL TIME TAKING CARE OF THIS PATIENT: 35 minutes.   POSSIBLE D/C IN 1-2 DAYS, DEPENDING ON CLINICAL CONDITION.   Katha Hamming M.D on 03/15/2017 at 8:30 AM  Between 7am to 6pm - Pager - [phone]  After 6pm go to [email or web address] - password [email or web address] [facility]  [facility] [place] [facility]  Office  [phone]  CC: Primary care physician; Royetta Asal, MD   Note: This dictation was prepared with Dragon dictation along with smaller phrase technology. Any transcriptional errors that result from this process are unintentional.

## 2017-03-16 NOTE — Progress Notes (Signed)
This nurse has received several erratic phone calls from the patient's daughter, she is complaining about facility that patient reside at [facility] of [address]  One minute she states that "she wants her father in a long term care facility," then at other times stating that "she does not want her father in a rehab facility."  Patient's daughter does sound like maybe she could be under the influence of a substance.  Monica, CSW made aware and calling daughter.  Orson Ape, RN

## 2017-03-16 NOTE — Clinical Social Work Note (Signed)
CSW spoke to [place] at [facility] to see if they could transport rather than EMS and he stated that they prefer EMS. Discharge information sent. Nurse to call report. York Spaniel MSW,LCSW [phone]

## 2017-03-16 NOTE — Care Management (Signed)
RNCM verified with CSW that patient will not need home health services. MD has not ordered home health either. No RNCM needs.

## 2017-03-16 NOTE — Discharge Summary (Signed)
Anthony Blankenship, is a 83 y.o. male  DOB 1925-06-06  MRN [id number].  Admission date:  03/12/2017  Admitting Physician  Altamese Dilling, MD  Discharge Date:  03/16/2017   Primary MD  Royetta Asal, MD  Recommendations for primary care physician for things to follow:   Follow-up with PCP, follow-up with Dr. Malvin Johns as an outpatient   Admission Diagnosis  Dehydration [E86.0] Weakness [R53.1] Altered mental status, unspecified altered mental status type [R41.82]   Discharge Diagnosis  Dehydration [E86.0] Weakness [R53.1] Altered mental status, unspecified altered mental status type [R41.82]   Principal Problem:   Altered mental status Active Problems:   Oropharyngeal dysphagia   Palliative care encounter      Past Medical History:  Diagnosis Date  . Anxiety   . Coronary artery disease   . Depression   . Hypertension   . Hypothyroidism   . Kidney stones   . Parkinson's disease (HCC)   . Restless leg syndrome     Past Surgical History:  Procedure Laterality Date  . APPENDECTOMY    . CARDIAC CATHETERIZATION    . CORONARY ARTERY BYPASS GRAFT         History of present illness and  Hospital Course:     Kindly see H&P for history of present illness and admission details, please review complete Labs, Consult reports and Test reports for all details in brief  HPI  from the history and physical done on the day of admission 83 year old male patient admitted for slurred speech, worsening confusion, swallowing difficulties. Patient has severe Parkinson disease, getting Sinemet, Comtan 7 times a day as per Dr. Roxanne Mins recommendation, he is admitted for evaluation of these symptoms.   Hospital Course  #87.83 year old male patient with severe Parkinson disease admitted for slurred speech, swallowing difficulties  and worsening confusion, neurology felt that symptoms are likely due to progressive Parkinson disease with an acute stroke. Head CAT scan was done 2 times did not show any strokes. Patient also is working up for any infection, no evidence of infection in the urine or pneumonia. Discussed with patient's daughter also on prednisone multiple times. Patient to continue Stalevo 1 tablet 7 times daily. Her anxiety symptoms he is on diazepam 2 mg every 12 hours as needed. Patient also seen by speech therapy because office follow difficulties, he is on dysphagia 1 diet honey thick liquids. Patient needs minimal assistance with ADLs. Did have some dehydration and received IV fluids. Seen by palliative care. Palliative care to follow at discharge. ; Continue melatonin, primidone, Zoloft but I stopped her Seroquel because of sedation affect that happened in the hospital  #2. Hypothyroidism;: Continue Synthroid. #3. Severe Malnutrition in the context of chronic illness: Continue honey thick mighty shakes by mouth 3 times a day with meals, Magic cups 3 times a day with meals.Kitchen can send up small cup of chocolate syrup on trays so patient's honey-thick milk and honey-thick mighty shake can be mixed with chocolate syrup. For essential hypertension: Controlled #4 . Physical therapy saw him for progressive weakness, recommended skilled nursing.    Disch arge Condition: stable   Follow UP   follow up with Dr. Malvin Johns as an outpatient in 2 weeks.   Discharge Instructions  and  Discharge Medications     Allergies as of 03/16/2017      Reactions   Other Other (See Comments)   Uncoded Allergy. Allergen: Sleeping Medications      Medication List    STOP taking these medications  guaifenesin 100 MG/5ML syrup Commonly known as:  ROBITUSSIN   QUEtiapine 25 MG tablet Commonly known as:  SEROQUEL     TAKE these medications   acetaminophen 325 MG tablet Commonly known as:  TYLENOL Take 650 mg by mouth  every 6 (six) hours as needed for mild pain.   ASPERCREME W/LIDOCAINE 4 % cream Generic drug:  lidocaine Apply 1 application topically 3 (three) times daily as needed.   barrier cream Crea Commonly known as:  non-specified Apply 1 application topically as needed.   carbidopa-levodopa-entacapone 31.25-125-200 MG tablet Commonly known as:  STALEVO Take 1 tablet by mouth See admin instructions. Take one tablet by mouth seven times daily   diazepam 2 MG tablet Commonly known as:  VALIUM Take 1 tablet (2 mg total) by mouth every 12 (twelve) hours as needed for anxiety.   diclofenac sodium 1 % Gel Commonly known as:  VOLTAREN Apply 1 g topically 3 (three) times daily.   diphenhydrAMINE 25 MG tablet Commonly known as:  BENADRYL Take 25 mg by mouth every 6 (six) hours as needed.   levothyroxine 75 MCG tablet Commonly known as:  SYNTHROID, LEVOTHROID Take 75 mcg by mouth daily.   loperamide 2 MG tablet Commonly known as:  IMODIUM A-D Take 2 mg by mouth as needed for diarrhea or loose stools. Do not exceed 8 doses in 24 hours   magnesium hydroxide 400 MG/5ML suspension Commonly known as:  MILK OF MAGNESIA Take 30 mLs by mouth daily as needed for mild constipation.   Melatonin 3 MG Tabs Take 1 tablet by mouth at bedtime.   primidone 50 MG tablet Commonly known as:  MYSOLINE Take 50 mg by mouth at bedtime.   senna-docusate 8.6-50 MG tablet Commonly known as:  Senokot-S Take 1 tablet by mouth at bedtime as needed for mild constipation.   sertraline 50 MG tablet Commonly known as:  ZOLOFT Take 1 tablet (50 mg total) by mouth daily.   traZODone 50 MG tablet Commonly known as:  DESYREL Take 25 mg by mouth at bedtime.   Vitamin D (Ergocalciferol) 50000 units Caps capsule Commonly known as:  DRISDOL Take 50,000 Units by mouth every 7 (seven) days.         Diet and Activity recommendation: See Discharge Instructions above   Consults obtained - neurology, speech  therapy, physical therapy, palliative care.    Major procedures and Radiology Reports - PLEASE review detailed and final reports for all details, in brief -      Dg Chest 1 View  Result Date: 03/15/2017 CLINICAL DATA:  Altered mental status. EXAM: CHEST 1 VIEW COMPARISON:  Radiograph of March 12, 2017. FINDINGS: Stable cardiomediastinal silhouette. Status post coronary artery bypass graft. Atherosclerosis of thoracic aorta is noted. No pneumothorax or pleural effusion is noted. Bony thorax is unremarkable. IMPRESSION: Aortic atherosclerosis.  No acute cardiopulmonary abnormality seen. Electronically Signed   By: Lupita Raider, M.D.   On: 03/15/2017 09:40   Dg Chest 1 View  Result Date: 02/23/2017 CLINICAL DATA:  Unwitnessed fall.  Tremors and weakness. EXAM: CHEST 1 VIEW COMPARISON:  01/24/2017 FINDINGS: Status post CABG. Top normal size cardiac silhouette. There is aortic atherosclerosis at the arch. No pneumonic consolidation or CHF. No effusion. No acute osseous abnormality. IMPRESSION: 1. Post CABG change with aortic atherosclerosis. 2. No active pulmonary disease. 3. No acute osseous abnormality. Electronically Signed   By: Tollie Eth M.D.   On: 02/23/2017 14:55   Dg Pelvis 1-2 Views  Result  Date: 02/23/2017 CLINICAL DATA:  Patient had unwitnessed fall today.  Weakness. EXAM: PELVIS - 1-2 VIEW COMPARISON:  01/24/2017 FINDINGS: There is no evidence of pelvic fracture or diastasis. No pelvic bone lesions are seen. Hip joints are maintained bilaterally. No proximal femoral fracture is identified. Mild disc space narrowing of the included lower lumbar spine. IMPRESSION: No acute pelvic or hip fracture is noted. Electronically Signed   By: Tollie Eth M.D.   On: 02/23/2017 14:56   Ct Head Wo Contrast  Result Date: 03/15/2017 CLINICAL DATA:  Altered level of mass EXAM: CT HEAD WITHOUT CONTRAST TECHNIQUE: Contiguous axial images were obtained from the base of the skull through the vertex  without intravenous contrast. COMPARISON:  03/12/2017 FINDINGS: Brain: There is atrophy and chronic small vessel disease changes. No acute intracranial abnormality. Specifically, no hemorrhage, hydrocephalus, mass lesion, acute infarction, or significant intracranial injury. Vascular: No hyperdense vessel or unexpected calcification. Skull: No acute calvarial abnormality. Sinuses/Orbits: Visualized paranasal sinuses and mastoids clear. Orbital soft tissues unremarkable. Other: None IMPRESSION: No acute intracranial abnormality. Atrophy, chronic microvascular disease. Electronically Signed   By: Charlett Nose M.D.   On: 03/15/2017 10:52   Ct Head Wo Contrast  Result Date: 03/12/2017 CLINICAL DATA:  Generalized weakness. Patient not eating or talking normally. EXAM: CT HEAD WITHOUT CONTRAST TECHNIQUE: Contiguous axial images were obtained from the base of the skull through the vertex without intravenous contrast. COMPARISON:  February 23, 2017 FINDINGS: Brain: No subdural, epidural, or subarachnoid hemorrhage. Cerebellum, brainstem, and basal cisterns are normal. No mass effect or midline shift. Ventricles and sulci are stable. White matter changes are stable. No acute cortical ischemia or infarct. No other acute abnormalities identified. Vascular: Calcified atherosclerosis is seen in the intracranial carotid arteries. Skull: Normal. Negative for fracture or focal lesion. Sinuses/Orbits: No acute finding. Other: None. IMPRESSION: No acute intracranial abnormality identified to explain the patient's symptoms. Electronically Signed   By: Gerome Sam III M.D   On: 03/12/2017 09:03   Ct Head Wo Contrast  Result Date: 02/23/2017 CLINICAL DATA:  Weakness and altered mental status. EXAM: CT HEAD WITHOUT CONTRAST TECHNIQUE: Contiguous axial images were obtained from the base of the skull through the vertex without intravenous contrast. COMPARISON:  02/07/2017 FINDINGS: BRAIN: There is sulcal and ventricular  prominence consistent with superficial and central atrophy. No intraparenchymal hemorrhage, mass effect nor midline shift. Periventricular and subcortical white matter hypodensities consistent with chronic small vessel ischemic disease are identified. No acute large vascular territory infarcts. No abnormal extra-axial fluid collections. Basal cisterns are not effaced and midline. VASCULAR: Moderate calcific atherosclerosis of the carotid siphons. SKULL: No skull fracture. No significant scalp soft tissue swelling. SINUSES/ORBITS: The mastoid air-cells are clear. The included paranasal sinuses are well-aerated.Right lens replacement. OTHER: None. IMPRESSION: Chronic stable atrophy and microvascular ischemic disease. No acute intracranial abnormality. Electronically Signed   By: Tollie Eth M.D.   On: 02/23/2017 14:43   Dg Chest Portable 1 View  Result Date: 03/12/2017 CLINICAL DATA:  Weakness. EXAM: PORTABLE CHEST 1 VIEW COMPARISON:  February 23, 2017 FINDINGS: The heart size and mediastinal contours are within normal limits. Both lungs are clear. The visualized skeletal structures are unremarkable. IMPRESSION: No active disease. Electronically Signed   By: Gerome Sam III M.D   On: 03/12/2017 08:37    Micro Results     Recent Results (from the past 240 hour(s))  Blood culture (routine x 2)     Status: None (Preliminary result)   Collection Time: 03/12/17  8:19 AM  Result Value Ref Range Status   Specimen Description BLOOD BLOOD RIGHT FOREARM  Final   Special Requests   Final    BOTTLES DRAWN AEROBIC AND ANAEROBIC Blood Culture adequate volume   Culture NO GROWTH 4 DAYS  Final   Report Status PENDING  Incomplete  Blood culture (routine x 2)     Status: None (Preliminary result)   Collection Time: 03/12/17  8:19 AM  Result Value Ref Range Status   Specimen Description BLOOD BLOOD LEFT WRIST  Final   Special Requests   Final    BOTTLES DRAWN AEROBIC AND ANAEROBIC Blood Culture adequate volume    Culture NO GROWTH 4 DAYS  Final   Report Status PENDING  Incomplete  MRSA PCR Screening     Status: None   Collection Time: 03/12/17  7:15 PM  Result Value Ref Range Status   MRSA by PCR NEGATIVE NEGATIVE Final    Comment:        The GeneXpert MRSA Assay (FDA approved for NASAL specimens only), is one component of a comprehensive MRSA colonization surveillance program. It is not intended to diagnose MRSA infection nor to guide or monitor treatment for MRSA infections.        Today   Subjective:   Anthony Blankenship today stable for discharge.  Objective:   Blood pressure (!) 154/84, pulse 62, temperature (!) 97.5 F (36.4 C), temperature source Oral, resp. rate 20, height  (1.778 m), weight 65.3 kg (144 lb), SpO2 98 %.   Intake/Output Summary (Last 24 hours) at 03/16/17 1204 Last data filed at 03/16/17 0812  Gross per 24 hour  Intake          1041.83 ml  Output                0 ml  Net          1041.83 ml    Exam Patient is awake.  [place].AT,PERRAL Supple Neck,No JVD, No cervical lymphadenopathy appriciated.  Symmetrical Chest wall movement, Good air movement bilaterally, CTAB RRR,No Gallops,Rubs or new Murmurs, No Parasternal Heave +ve B.Sounds, Abd Soft, Non tender, No organomegaly appriciated, No rebound -guarding or rigidity. No Cyanosis, Clubbing or edema, No new Rash or bruise  Data Review   CBC w Diff:  Lab Results  Component Value Date   WBC 4.8 03/13/2017   HGB 13.1 03/13/2017   HGB 14.0 10/18/2014   HCT 37.8 (L) 03/13/2017   HCT 40.4 10/18/2014   PLT 113 (L) 03/13/2017   PLT 140 (L) 10/18/2014   LYMPHOPCT 16 02/07/2017   MONOPCT 5 02/07/2017   EOSPCT 1 02/07/2017   BASOPCT 0 02/07/2017    CMP:  Lab Results  Component Value Date   NA 139 03/13/2017   NA 140 10/18/2014   K 3.9 03/13/2017   K 3.8 10/18/2014   CL 106 03/13/2017   CL 108 10/18/2014   CO2 26 03/13/2017   CO2 24 10/18/2014   BUN 33 (H) 03/13/2017   BUN 46 (H)  10/18/2014   CREATININE 0.99 03/13/2017   CREATININE 1.62 (H) 10/18/2014   PROT 8.0 03/12/2017   PROT 6.9 10/18/2014   ALBUMIN 5.0 03/12/2017   ALBUMIN 4.4 10/18/2014   BILITOT 1.5 (H) 03/12/2017   BILITOT 0.9 10/18/2014   ALKPHOS 46 03/12/2017   ALKPHOS 58 10/18/2014   AST 29 03/12/2017   AST 24 10/18/2014   ALT 27 03/12/2017   ALT <5 10/18/2014  .   Total Time  in preparing paper work, data evaluation and todays exam - 35 minutes  Anthony Blankenship M.D on 03/16/2017 at 12:04 PM    Note: This dictation was prepared with Dragon dictation along with smaller phrase technology. Any transcriptional errors that result from this process are unintentional.

## 2017-03-16 NOTE — Progress Notes (Signed)
Physical Therapy Treatment Patient Details Name: Anthony Blankenship MRN: [id number] DOB: 06-04-1925 Today's Date: 03/16/2017    History of Present Illness presented to ER from ALF with progressive weakness; admitted with AMS, worsening of Parkinson's disease.    PT Comments    Participated in exercises as described below.  Mod assist for bed mobility and min a x 1 to remain sitting edge of bed.  He is able to stand with min a x 1 and ambulate 10' x 1 just short of doorway in room with short shuffling steps.  Recliner follow used for safety as pt fatigued quickly and was unable to ambulate back to his bed.  Overall tolerated well with increased mobility.  SNF remains appropriate   Follow Up Recommendations  SNF     Equipment Recommendations       Recommendations for Other Services       Precautions / Restrictions Precautions Precautions: Fall Restrictions Weight Bearing Restrictions: No    Mobility  Bed Mobility Overal bed mobility: Needs Assistance Bed Mobility: Supine to Sit     Supine to sit: Mod assist        Transfers Overall transfer level: Needs assistance Equipment used: Rolling walker (2 wheeled) Transfers: Sit to/from Stand Sit to Stand: Mod assist         General transfer comment: generally improved today with less assist needed.  Ambulation/Gait Ambulation/Gait assistance: Mod assist Ambulation Distance (Feet): 10 Feet Assistive device: Rolling walker (2 wheeled) Gait Pattern/deviations: Step-through pattern;Decreased step length - right;Decreased step length - left;Shuffle   Gait velocity interpretation: <1.8 ft/sec, indicative of risk for recurrent falls General Gait Details: short shuffling steps, requires mod a x 1 for safety/balance   Stairs            Wheelchair Mobility    Modified Rankin (Stroke Patients Only)       Balance Overall balance assessment: Needs assistance Sitting-balance support: No upper extremity  supported;Feet supported Sitting balance-Leahy Scale: Poor Sitting balance - Comments: requires +1 assist at all times due to LOB left   Standing balance support: Bilateral upper extremity supported Standing balance-Leahy Scale: Poor                              Cognition Arousal/Alertness: Awake/alert Behavior During Therapy: WFL for tasks assessed/performed Overall Cognitive Status: Within Functional Limits for tasks assessed                                        Exercises Other Exercises Other Exercises: supine AAROM BLE for ankle pumps, heel slides, SLR, ab/add x 10    General Comments        Pertinent Vitals/Pain Pain Assessment: No/denies pain    Home Living                      Prior Function            PT Goals (current goals can now be found in the care plan section) Progress towards PT goals: Progressing toward goals    Frequency    Min 2X/week      PT Plan      Co-evaluation              AM-PAC PT "6 Clicks" Daily Activity  Outcome Measure  Difficulty turning over in bed (including adjusting  bedclothes, sheets and blankets)?: Unable Difficulty moving from lying on back to sitting on the side of the bed? : Unable Difficulty sitting down on and standing up from a chair with arms (e.g., wheelchair, bedside commode, etc,.)?: Unable Help needed moving to and from a bed to chair (including a wheelchair)?: A Lot Help needed walking in hospital room?: A Lot Help needed climbing 3-5 steps with a railing? : A Lot 6 Click Score: 9    End of Session Equipment Utilized During Treatment: Gait belt Activity Tolerance: Patient tolerated treatment well Patient left: in chair;with call bell/phone within reach;with chair alarm set Nurse Communication: Mobility status       Time: 1040-1100 PT Time Calculation (min) (ACUTE ONLY): 20 min  Charges:  $Gait Training: 8-22 mins                    G Codes:        Danielle Dess, PTA 03/16/17, 11:21 AM

## 2017-03-16 NOTE — Clinical Social Work Note (Addendum)
CSW spoke with Amalia Hailey at [facility] about sending the discharge information and he asked if patient was still coming because the daughter had just called him upset because they could not move patient's room. CSW explained patient's daughter's erratic thinking this morning and multiple phone calls to nursing, CSW, and [facility] and Amalia Hailey stated that patient's daughter allegedly has a prescription drug addiction which causes her to behave erratically.   Nursing received another call from patient's daughter stating that she did not know what to do now that [facility] will not move her father's room. CSW called patient's daughter again and asked what her call was about. Patient's daughter informed asked CSW if patient wanted to return to [facility] again, a question she had already asked 3 times. CSW answered that he had said that he wanted to return, twice with me and once with her on the phone with him and me in the room. Patient's daughter stated "my dad doesn't want to go to no rehab." Patient's daughter agreeable again to patient going to [facility].  CSW asked by RN CM if patient was needing home health. The MD did not order home health for discharge. [facility] will arrange home health if they deem him in need of this. York Spaniel MSW,LCSW [phone]

## 2017-03-16 NOTE — Progress Notes (Signed)
Patient transported via EMS to [facility].

## 2017-03-16 NOTE — Clinical Social Work Note (Signed)
Patient to discharge today. CSW contacted patient's daughter via phone and she was upset and stated her father did not want to go to rehab and she wanted him to do what he prefers. CSW contacted the [facility] and spoke with Keta and told her exactly what the physical therapy evaluation documented and their recommendation for STR. Keta went to inform their administrator and returned to phone to say that they will be able to take patient back today. CSW spoke with patient in his room and daughter again on the phone in his room and daughter states that she does not transport her father but has EMS transport. York Spaniel MSW,LCSW [phone]

## 2017-03-16 NOTE — Progress Notes (Signed)
Report called to Keta at the [facility] and EMS contacted for transport.  Orson Ape, RN

## 2017-03-16 NOTE — Discharge Instructions (Signed)

## 2017-03-16 NOTE — Progress Notes (Signed)
[facility]   PATIENT NAME: Anthony Blankenship    MR#:  [id number]  DATE OF BIRTH:  [birth year] Pt is awake but does not  want to be disturbed, Patient is awake:   Chief Complaint  Patient presents with  . Weakness    REVIEW OF SYSTEMS:   Review of Systems  Unable to perform ROS: Mental acuity  Neurological: Negative for sensory change.    DRUG ALLERGIES:   Allergies  Allergen Reactions  . Other Other (See Comments)    Uncoded Allergy. Allergen: Sleeping Medications    VITALS:  Blood pressure (!) 154/84, pulse 62, temperature (!) 97.5 F (36.4 C), temperature source Oral, resp. rate 20, height  (1.778 m), weight 65.3 kg (144 lb), SpO2 98 %.  PHYSICAL EXAMINATION:  GENERAL:  83 y.o.-year-old patient lying in the bed shaking non stop due to PD.  EYES: Pupils equal, round, reactive to light  . No scleral icterus. Ex HEENT: Head atraumatic, normocephalic. Oropharynx and nasopharynx clear.  NECK:  Supple, no jugular venous distention. No thyroid enlargement, no tenderness.  LUNGS: Normal breath sounds bilaterally, no wheezing, rales,rhonchi or crepitation. No use of accessory muscles of respiration.  CARDIOVASCULAR: S1, S2 normal. No murmurs, rubs, or gallops.  ABDOMEN: Soft, nontender, nondistended. Bowel sounds present. No organomegaly or mass.  EXTREMITIES: No pedal edema, cyanosis, or clubbing.  NEUROLOGIC: heis having extreme intentional tremors, sleeping unable to do full neurological exam.  PSYCHIATRIC: Alert and mumbles. SKIN: No obvious rash, lesion, or ulcer.    LABORATORY PANEL:   CBC  Recent Labs Lab 03/13/17 0943  WBC 4.8  HGB 13.1  HCT 37.8*  PLT 113*   ------------------------------------------------------------------------------------------------------------------  Chemistries   Recent Labs Lab 03/12/17 0819 03/13/17 0943  NA 142 139  K 4.1 3.9  CL 105 106  CO2 28 26  GLUCOSE 101* 84   BUN 53* 33*  CREATININE 1.33* 0.99  CALCIUM 9.9 8.7*  AST 29  --   ALT 27  --   ALKPHOS 46  --   BILITOT 1.5*  --    ------------------------------------------------------------------------------------------------------------------  Cardiac Enzymes  Recent Labs Lab 03/12/17 0819  TROPONINI 0.03*   ------------------------------------------------------------------------------------------------------------------  RADIOLOGY:  Dg Chest 1 View  Result Date: 03/15/2017 CLINICAL DATA:  Altered mental status. EXAM: CHEST 1 VIEW COMPARISON:  Radiograph of March 12, 2017. FINDINGS: Stable cardiomediastinal silhouette. Status post coronary artery bypass graft. Atherosclerosis of thoracic aorta is noted. No pneumothorax or pleural effusion is noted. Bony thorax is unremarkable. IMPRESSION: Aortic atherosclerosis.  No acute cardiopulmonary abnormality seen. Electronically Signed   By: Lupita Raider, M.D.   On: 03/15/2017 09:40   Ct Head Wo Contrast  Result Date: 03/15/2017 CLINICAL DATA:  Altered level of mass EXAM: CT HEAD WITHOUT CONTRAST TECHNIQUE: Contiguous axial images were obtained from the base of the skull through the vertex without intravenous contrast. COMPARISON:  03/12/2017 FINDINGS: Brain: There is atrophy and chronic small vessel disease changes. No acute intracranial abnormality. Specifically, no hemorrhage, hydrocephalus, mass lesion, acute infarction, or significant intracranial injury. Vascular: No hyperdense vessel or unexpected calcification. Skull: No acute calvarial abnormality. Sinuses/Orbits: Visualized paranasal sinuses and mastoids clear. Orbital soft tissues unremarkable. Other: None IMPRESSION: No acute intracranial abnormality. Atrophy, chronic microvascular disease. Electronically Signed   By: Charlett Nose M.D.   On: 03/15/2017 10:52    EKG:   Orders placed or performed during the hospital encounter of 03/12/17  . ED EKG  . ED  EKG  . EKG 12-Lead  . EKG  12-Lead  . EKG 12-Lead  . EKG 12-Lead  . EKG 12-Lead  . EKG 12-Lead    ASSESSMENT AND PLAN:   83 year old male patient with slurred speech, worsening tremors, worsening mental status likely secondary to worsening Parkinson disease. Infection workup has been negative. Continue dysphagia 1 diet, physical therapy recommended skilled nursing,stable for discharge today if arrangements are made.   #2 dehydration: Received IV fluids.  . #3 depression: Continue Zoloft.   #4: History of Parkinson disease: adjusted Parkinson medicines as per Dr. Malvin Johns neurologist recommendation, patient followed with him recently 4.sedative effect of seroquel?hold it for now..  CODE STATUS DO NOT RESUSCITATE. prognosis poor due to advanced age, severe Parkinson disease.  All the records are reviewed and case discussed with Care Management/Social Workerr. Management plans discussed with the patient, family and they are in agreement.  CODE STATUS: dnr  TOTAL TIME TAKING CARE OF THIS PATIENT: 35 minutes.   POSSIBLE D/C IN 1-2 DAYS, DEPENDING ON CLINICAL CONDITION.   Katha Hamming M.D on 03/16/2017 at 9:14 AM  Between 7am to 6pm - Pager - [phone]  After 6pm go to [email or web address] - password [email or web address] [facility]  [facility]  Office  [phone]  CC: Primary care physician; Royetta Asal, MD   Note: This dictation was prepared with Dragon dictation along with smaller phrase technology. Any transcriptional errors that result from this process are unintentional.

## 2017-03-17 LAB — CULTURE, BLOOD (ROUTINE X 2)
Culture: NO GROWTH
Culture: NO GROWTH
Special Requests: ADEQUATE
Special Requests: ADEQUATE

## 2017-03-20 ENCOUNTER — Emergency Department
Admission: EM | Admit: 2017-03-20 | Discharge: 2017-03-20 | Disposition: A | Discharge status: Home / Self Care | Payer: Medicare Other | Attending: Emergency Medicine | Admitting: Emergency Medicine

## 2017-03-20 ENCOUNTER — Emergency Department: Payer: Medicare Other

## 2017-03-20 DIAGNOSIS — Z7982 Long term (current) use of aspirin: Secondary | ICD-10-CM | POA: Insufficient documentation

## 2017-03-20 DIAGNOSIS — W06XXXA Fall from bed, initial encounter: Secondary | ICD-10-CM | POA: Insufficient documentation

## 2017-03-20 DIAGNOSIS — I25119 Atherosclerotic heart disease of native coronary artery with unspecified angina pectoris: Secondary | ICD-10-CM | POA: Insufficient documentation

## 2017-03-20 DIAGNOSIS — I10 Essential (primary) hypertension: Secondary | ICD-10-CM | POA: Diagnosis not present

## 2017-03-20 DIAGNOSIS — G20 Parkinson's disease: Secondary | ICD-10-CM | POA: Diagnosis not present

## 2017-03-20 DIAGNOSIS — F0390 Unspecified dementia without behavioral disturbance: Secondary | ICD-10-CM | POA: Insufficient documentation

## 2017-03-20 DIAGNOSIS — Z79899 Other long term (current) drug therapy: Secondary | ICD-10-CM | POA: Diagnosis not present

## 2017-03-20 DIAGNOSIS — Y999 Unspecified external cause status: Secondary | ICD-10-CM | POA: Diagnosis not present

## 2017-03-20 DIAGNOSIS — R51 Headache: Secondary | ICD-10-CM | POA: Diagnosis not present

## 2017-03-20 DIAGNOSIS — W19XXXA Unspecified fall, initial encounter: Secondary | ICD-10-CM

## 2017-03-20 DIAGNOSIS — Y939 Activity, unspecified: Secondary | ICD-10-CM | POA: Diagnosis not present

## 2017-03-20 DIAGNOSIS — Y92122 Bedroom in nursing home as the place of occurrence of the external cause: Secondary | ICD-10-CM | POA: Insufficient documentation

## 2017-03-20 DIAGNOSIS — E039 Hypothyroidism, unspecified: Secondary | ICD-10-CM | POA: Insufficient documentation

## 2017-03-20 MED ORDER — OLANZAPINE 5 MG PO TBDP
5.0000 mg | ORAL_TABLET | Freq: Every day | ORAL | Status: DC
  Administered 2017-03-20: 5 mg via ORAL
  Filled 2017-03-20: qty 1

## 2017-03-20 NOTE — Discharge Instructions (Signed)
Today your CT scans were normal. Please follow-up with your primary care physician as needed and return to the emergency department for any concerns.  It was a pleasure to take care of you today, and thank you for coming to our emergency department.  If you have any questions or concerns before leaving please ask the nurse to grab me and I'm more than happy to go through your aftercare instructions again.  If you were prescribed any opioid pain medication today such as Norco, Vicodin, Percocet, morphine, hydrocodone, or oxycodone please make sure you do not drive when you are taking this medication as it can alter your ability to drive safely.  If you have any concerns once you are home that you are not improving or are in fact getting worse before you can make it to your follow-up appointment, please do not hesitate to call 911 and come back for further evaluation.  Merrily Brittle, MD  Results for orders placed or performed during the hospital encounter of 03/12/17  Blood culture (routine x 2)  Result Value Ref Range   Specimen Description BLOOD BLOOD RIGHT FOREARM    Special Requests      BOTTLES DRAWN AEROBIC AND ANAEROBIC Blood Culture adequate volume   Culture NO GROWTH 5 DAYS    Report Status 03/17/2017 FINAL   Blood culture (routine x 2)  Result Value Ref Range   Specimen Description BLOOD BLOOD LEFT WRIST    Special Requests      BOTTLES DRAWN AEROBIC AND ANAEROBIC Blood Culture adequate volume   Culture NO GROWTH 5 DAYS    Report Status 03/17/2017 FINAL   MRSA PCR Screening  Result Value Ref Range   MRSA by PCR NEGATIVE NEGATIVE  Lactic acid, plasma  Result Value Ref Range   Lactic Acid, Venous 1.1 0.5 - 1.9 mmol/L  CBC  Result Value Ref Range   WBC 6.4 3.8 - 10.6 K/uL   RBC 4.39 (L) 4.40 - 5.90 MIL/uL   Hemoglobin 14.2 13.0 - 18.0 g/dL   HCT 16.1 09.6 - 04.5 %   MCV 94.1 80.0 - 100.0 fL   MCH 32.3 26.0 - 34.0 pg   MCHC 34.3 32.0 - 36.0 g/dL   RDW 40.9 81.1 - 91.4 %     Platelets 149 (L) 150 - 440 K/uL  Comprehensive metabolic panel  Result Value Ref Range   Sodium 142 135 - 145 mmol/L   Potassium 4.1 3.5 - 5.1 mmol/L   Chloride 105 101 - 111 mmol/L   CO2 28 22 - 32 mmol/L   Glucose, Bld 101 (H) 65 - 99 mg/dL   BUN 53 (H) 6 - 20 mg/dL   Creatinine, Ser 7.82 (H) 0.61 - 1.24 mg/dL   Calcium 9.9 8.9 - 95.6 mg/dL   Total Protein 8.0 6.5 - 8.1 g/dL   Albumin 5.0 3.5 - 5.0 g/dL   AST 29 15 - 41 U/L   ALT 27 17 - 63 U/L   Alkaline Phosphatase 46 38 - 126 U/L   Total Bilirubin 1.5 (H) 0.3 - 1.2 mg/dL   GFR calc non Af Amer 45 (L) >60 mL/min   GFR calc Af Amer 53 (L) >60 mL/min   Anion gap 9 5 - 15  Troponin I  Result Value Ref Range   Troponin I 0.03 (HH) <0.03 ng/mL  Urinalysis, Complete w Microscopic  Result Value Ref Range   Color, Urine YELLOW (A) YELLOW   APPearance CLEAR (A) CLEAR   Specific Gravity,  Urine 1.025 1.005 - 1.030   pH 5.0 5.0 - 8.0   Glucose, UA NEGATIVE NEGATIVE mg/dL   Hgb urine dipstick NEGATIVE NEGATIVE   Bilirubin Urine NEGATIVE NEGATIVE   Ketones, ur 5 (A) NEGATIVE mg/dL   Protein, ur NEGATIVE NEGATIVE mg/dL   Nitrite NEGATIVE NEGATIVE   Leukocytes, UA TRACE (A) NEGATIVE   RBC / HPF 0-5 0 - 5 RBC/hpf   WBC, UA 0-5 0 - 5 WBC/hpf   Bacteria, UA NONE SEEN NONE SEEN   Squamous Epithelial / LPF NONE SEEN NONE SEEN   Mucus PRESENT   Basic metabolic panel  Result Value Ref Range   Sodium 139 135 - 145 mmol/L   Potassium 3.9 3.5 - 5.1 mmol/L   Chloride 106 101 - 111 mmol/L   CO2 26 22 - 32 mmol/L   Glucose, Bld 84 65 - 99 mg/dL   BUN 33 (H) 6 - 20 mg/dL   Creatinine, Ser 1.61 0.61 - 1.24 mg/dL   Calcium 8.7 (L) 8.9 - 10.3 mg/dL   GFR calc non Af Amer >60 >60 mL/min   GFR calc Af Amer >60 >60 mL/min   Anion gap 7 5 - 15  CBC  Result Value Ref Range   WBC 4.8 3.8 - 10.6 K/uL   RBC 4.14 (L) 4.40 - 5.90 MIL/uL   Hemoglobin 13.1 13.0 - 18.0 g/dL   HCT 09.6 (L) 04.5 - 40.9 %   MCV 91.5 80.0 - 100.0 fL   MCH 31.7  26.0 - 34.0 pg   MCHC 34.6 32.0 - 36.0 g/dL   RDW 81.1 91.4 - 78.2 %   Platelets 113 (L) 150 - 440 K/uL  Blood gas, arterial  Result Value Ref Range   FIO2 0.21    pH, Arterial 7.41 7.350 - 7.450   pCO2 arterial 44 32.0 - 48.0 mmHg   pO2, Arterial 73 (L) 83.0 - 108.0 mmHg   Bicarbonate 27.9 20.0 - 28.0 mmol/L   Acid-Base Excess 2.7 (H) 0.0 - 2.0 mmol/L   O2 Saturation 94.6 %   Patient temperature 37.0    Collection site LEFT RADIAL    Sample type ARTERIAL DRAW    Allens test (pass/fail) PASS PASS  Glucose, capillary  Result Value Ref Range   Glucose-Capillary 84 65 - 99 mg/dL   Dg Chest 1 View  Result Date: 03/15/2017 CLINICAL DATA:  Altered mental status. EXAM: CHEST 1 VIEW COMPARISON:  Radiograph of March 12, 2017. FINDINGS: Stable cardiomediastinal silhouette. Status post coronary artery bypass graft. Atherosclerosis of thoracic aorta is noted. No pneumothorax or pleural effusion is noted. Bony thorax is unremarkable. IMPRESSION: Aortic atherosclerosis.  No acute cardiopulmonary abnormality seen. Electronically Signed   By: Lupita Raider, M.D.   On: 03/15/2017 09:40   Dg Chest 1 View  Result Date: 02/23/2017 CLINICAL DATA:  Unwitnessed fall.  Tremors and weakness. EXAM: CHEST 1 VIEW COMPARISON:  01/24/2017 FINDINGS: Status post CABG. Top normal size cardiac silhouette. There is aortic atherosclerosis at the arch. No pneumonic consolidation or CHF. No effusion. No acute osseous abnormality. IMPRESSION: 1. Post CABG change with aortic atherosclerosis. 2. No active pulmonary disease. 3. No acute osseous abnormality. Electronically Signed   By: Tollie Eth M.D.   On: 02/23/2017 14:55   Dg Pelvis 1-2 Views  Result Date: 02/23/2017 CLINICAL DATA:  Patient had unwitnessed fall today.  Weakness. EXAM: PELVIS - 1-2 VIEW COMPARISON:  01/24/2017 FINDINGS: There is no evidence of pelvic fracture or diastasis. No pelvic bone  lesions are seen. Hip joints are maintained bilaterally. No  proximal femoral fracture is identified. Mild disc space narrowing of the included lower lumbar spine. IMPRESSION: No acute pelvic or hip fracture is noted. Electronically Signed   By: Tollie Eth M.D.   On: 02/23/2017 14:56   Ct Head Wo Contrast  Result Date: 03/20/2017 CLINICAL DATA:  Initial evaluation for acute head trauma, ataxia. EXAM: CT HEAD WITHOUT CONTRAST CT CERVICAL SPINE WITHOUT CONTRAST TECHNIQUE: Multidetector CT imaging of the head and cervical spine was performed following the standard protocol without intravenous contrast. Multiplanar CT image reconstructions of the cervical spine were also generated. COMPARISON:  Priors CT from 03/15/2017. FINDINGS: CT HEAD FINDINGS Brain: Generalized age related cerebral atrophy. Mild chronic small vessel ischemic disease. No acute intracranial hemorrhage. No evidence for acute large vessel territory infarct. No mass lesion, midline shift or mass effect. No hydrocephalus. No extra-axial fluid collection. Vascular: No hyperdense vessel. Scattered vascular calcifications noted within the carotid siphons. Skull: Scalp soft tissues and calvarium within normal limits. Sinuses/Orbits: Globes and orbital soft tissues demonstrate no acute abnormality. Paranasal sinuses and mastoid air cells are clear. Other: None. CT CERVICAL SPINE FINDINGS Alignment: Vertebral bodies normally aligned with preservation of the normal cervical lordosis. Trace anterolisthesis of C7 on T1, stable, and likely due to chronic facet degeneration. Skull base and vertebrae: Skullbase intact. Normal C1-2 articulations are preserved in the dens is intact. Vertebral body heights maintained. The no acute fracture. Soft tissues and spinal canal: Soft tissues of the neck demonstrate no acute abnormality. Vascular calcifications about the carotid bifurcations. No prevertebral edema. Disc levels: Moderate multilevel degenerative spondylolysis, greatest at C5-6 and C6-7. Prominent degenerative changes  with calcification noted about the C1-2 articulation. Upper chest: Visualized upper chest demonstrates no acute abnormality. Partially visualized lungs are clear. No apical pneumothorax. Other: None. IMPRESSION: CT BRAIN: 1. No acute intracranial process. 2. Stable atrophy with chronic microvascular ischemic disease. CT CERVICAL SPINE: 1. No acute traumatic injury within the cervical spine. 2. Moderate multilevel degenerative spondylolysis, greatest at C5-6 and C6-7. Electronically Signed   By: Rise Mu M.D.   On: 03/20/2017 21:37   Ct Head Wo Contrast  Result Date: 03/15/2017 CLINICAL DATA:  Altered level of mass EXAM: CT HEAD WITHOUT CONTRAST TECHNIQUE: Contiguous axial images were obtained from the base of the skull through the vertex without intravenous contrast. COMPARISON:  03/12/2017 FINDINGS: Brain: There is atrophy and chronic small vessel disease changes. No acute intracranial abnormality. Specifically, no hemorrhage, hydrocephalus, mass lesion, acute infarction, or significant intracranial injury. Vascular: No hyperdense vessel or unexpected calcification. Skull: No acute calvarial abnormality. Sinuses/Orbits: Visualized paranasal sinuses and mastoids clear. Orbital soft tissues unremarkable. Other: None IMPRESSION: No acute intracranial abnormality. Atrophy, chronic microvascular disease. Electronically Signed   By: Charlett Nose M.D.   On: 03/15/2017 10:52   Ct Head Wo Contrast  Result Date: 03/12/2017 CLINICAL DATA:  Generalized weakness. Patient not eating or talking normally. EXAM: CT HEAD WITHOUT CONTRAST TECHNIQUE: Contiguous axial images were obtained from the base of the skull through the vertex without intravenous contrast. COMPARISON:  February 23, 2017 FINDINGS: Brain: No subdural, epidural, or subarachnoid hemorrhage. Cerebellum, brainstem, and basal cisterns are normal. No mass effect or midline shift. Ventricles and sulci are stable. White matter changes are stable. No  acute cortical ischemia or infarct. No other acute abnormalities identified. Vascular: Calcified atherosclerosis is seen in the intracranial carotid arteries. Skull: Normal. Negative for fracture or focal lesion. Sinuses/Orbits: No acute finding. Other:  None. IMPRESSION: No acute intracranial abnormality identified to explain the patient's symptoms. Electronically Signed   By: Gerome Sam III M.D   On: 03/12/2017 09:03   Ct Head Wo Contrast  Result Date: 02/23/2017 CLINICAL DATA:  Weakness and altered mental status. EXAM: CT HEAD WITHOUT CONTRAST TECHNIQUE: Contiguous axial images were obtained from the base of the skull through the vertex without intravenous contrast. COMPARISON:  02/07/2017 FINDINGS: BRAIN: There is sulcal and ventricular prominence consistent with superficial and central atrophy. No intraparenchymal hemorrhage, mass effect nor midline shift. Periventricular and subcortical white matter hypodensities consistent with chronic small vessel ischemic disease are identified. No acute large vascular territory infarcts. No abnormal extra-axial fluid collections. Basal cisterns are not effaced and midline. VASCULAR: Moderate calcific atherosclerosis of the carotid siphons. SKULL: No skull fracture. No significant scalp soft tissue swelling. SINUSES/ORBITS: The mastoid air-cells are clear. The included paranasal sinuses are well-aerated.Right lens replacement. OTHER: None. IMPRESSION: Chronic stable atrophy and microvascular ischemic disease. No acute intracranial abnormality. Electronically Signed   By: Tollie Eth M.D.   On: 02/23/2017 14:43   Ct Cervical Spine Wo Contrast  Result Date: 03/20/2017 CLINICAL DATA:  Initial evaluation for acute head trauma, ataxia. EXAM: CT HEAD WITHOUT CONTRAST CT CERVICAL SPINE WITHOUT CONTRAST TECHNIQUE: Multidetector CT imaging of the head and cervical spine was performed following the standard protocol without intravenous contrast. Multiplanar CT image  reconstructions of the cervical spine were also generated. COMPARISON:  Priors CT from 03/15/2017. FINDINGS: CT HEAD FINDINGS Brain: Generalized age related cerebral atrophy. Mild chronic small vessel ischemic disease. No acute intracranial hemorrhage. No evidence for acute large vessel territory infarct. No mass lesion, midline shift or mass effect. No hydrocephalus. No extra-axial fluid collection. Vascular: No hyperdense vessel. Scattered vascular calcifications noted within the carotid siphons. Skull: Scalp soft tissues and calvarium within normal limits. Sinuses/Orbits: Globes and orbital soft tissues demonstrate no acute abnormality. Paranasal sinuses and mastoid air cells are clear. Other: None. CT CERVICAL SPINE FINDINGS Alignment: Vertebral bodies normally aligned with preservation of the normal cervical lordosis. Trace anterolisthesis of C7 on T1, stable, and likely due to chronic facet degeneration. Skull base and vertebrae: Skullbase intact. Normal C1-2 articulations are preserved in the dens is intact. Vertebral body heights maintained. The no acute fracture. Soft tissues and spinal canal: Soft tissues of the neck demonstrate no acute abnormality. Vascular calcifications about the carotid bifurcations. No prevertebral edema. Disc levels: Moderate multilevel degenerative spondylolysis, greatest at C5-6 and C6-7. Prominent degenerative changes with calcification noted about the C1-2 articulation. Upper chest: Visualized upper chest demonstrates no acute abnormality. Partially visualized lungs are clear. No apical pneumothorax. Other: None. IMPRESSION: CT BRAIN: 1. No acute intracranial process. 2. Stable atrophy with chronic microvascular ischemic disease. CT CERVICAL SPINE: 1. No acute traumatic injury within the cervical spine. 2. Moderate multilevel degenerative spondylolysis, greatest at C5-6 and C6-7. Electronically Signed   By: Rise Mu M.D.   On: 03/20/2017 21:37   Dg Chest Portable 1  View  Result Date: 03/12/2017 CLINICAL DATA:  Weakness. EXAM: PORTABLE CHEST 1 VIEW COMPARISON:  February 23, 2017 FINDINGS: The heart size and mediastinal contours are within normal limits. Both lungs are clear. The visualized skeletal structures are unremarkable. IMPRESSION: No active disease. Electronically Signed   By: Gerome Sam III M.D   On: 03/12/2017 08:37

## 2017-03-20 NOTE — ED Notes (Signed)
Pt given chocolate milk per request

## 2017-03-20 NOTE — ED Notes (Signed)
Pt is a DNR living at the [facility] suffering from Parkinsons. Pt has had 2 falls today. Last fall was tonight off the end of the bed. Pt will respond yes and no very quietly but not much else. Did not c/o pain with this RN but EMS reported headache complaint.

## 2017-03-20 NOTE — ED Provider Notes (Signed)
[facility] Emergency Department Provider Note  ____________________________________________   First MD Initiated Contact with Patient 03/20/17 2046     (approximate)  I have reviewed the triage vital signs and the nursing notes.   HISTORY  Chief Complaint Fall  level V exemption history Limited by the patient's severe dementia  HPI Anthony Blankenship is a 83 y.o. male who comes to the emergency department via EMS after having 2 falls at his nursing home today. Most recently his fall was witnessed by his roommate. The patient rolled over in bed and fell to the ground striking his head. He was unable to get up on his own but when EMS arrived he was able to stand and pivot with assistance. According to staff members the patient is taking aspirin and is currently at his baseline.   Past Medical History:  Diagnosis Date  . Anxiety   . Coronary artery disease   . Depression   . Hypertension   . Hypothyroidism   . Kidney stones   . Parkinson's disease (HCC)   . Restless leg syndrome     Patient Active Problem List   Diagnosis Date Noted  . Oropharyngeal dysphagia   . Palliative care encounter   . Altered mental status 02/08/2017  . Frailty 12/14/2016  . Orthostatic hypotension 12/14/2016  . Parkinson's disease (HCC)   . Palliative care by specialist   . Goals of care, counseling/discussion   . Gait instability 09/13/2016  . Recurrent falls 09/13/2016  . Coronary artery disease involving native coronary artery of native heart with angina pectoris (HCC) 09/08/2016  . Near syncope 06/12/2016  . Lower GI bleed 04/25/2016  . GI bleed 04/25/2016  . Restlessness 05/14/2015    Past Surgical History:  Procedure Laterality Date  . APPENDECTOMY    . CARDIAC CATHETERIZATION    . CORONARY ARTERY BYPASS GRAFT      Prior to Admission medications   Medication Sig Start Date End Date Taking? Authorizing Provider  acetaminophen (TYLENOL) 325 MG tablet  Take 650 mg by mouth every 6 (six) hours as needed for mild pain.     [provider]  barrier cream (NON-SPECIFIED) CREA Apply 1 application topically as needed.    [provider]  carbidopa-levodopa-entacapone (STALEVO) 31.25-125-200 MG tablet Take 1 tablet by mouth See admin instructions. Take one tablet by mouth seven times daily    [provider]  diazepam (VALIUM) 2 MG tablet Take 1 tablet (2 mg total) by mouth every 12 (twelve) hours as needed for anxiety. 09/16/16   Delfino Lovett, MD  diclofenac sodium (VOLTAREN) 1 % GEL Apply 1 g topically 3 (three) times daily.    [provider]  diphenhydrAMINE (BENADRYL) 25 MG tablet Take 25 mg by mouth every 6 (six) hours as needed.    [provider]  levothyroxine (SYNTHROID, LEVOTHROID) 75 MCG tablet Take 75 mcg by mouth daily.     [provider]  lidocaine (ASPERCREME W/LIDOCAINE) 4 % cream Apply 1 application topically 3 (three) times daily as needed.    [provider]  loperamide (IMODIUM A-D) 2 MG tablet Take 2 mg by mouth as needed for diarrhea or loose stools. Do not exceed 8 doses in 24 hours    [provider]  magnesium hydroxide (MILK OF MAGNESIA) 400 MG/5ML suspension Take 30 mLs by mouth daily as needed for mild constipation.    [provider]  Melatonin 3 MG TABS Take 1 tablet by mouth at  bedtime.    [provider]  primidone (MYSOLINE) 50 MG tablet Take 50 mg by mouth at bedtime.    [provider]  senna-docusate (SENOKOT-S) 8.6-50 MG tablet Take 1 tablet by mouth at bedtime as needed for mild constipation. Patient not taking: Reported on 03/12/2017 02/09/17   Ramonita Lab, MD  sertraline (ZOLOFT) 50 MG tablet Take 1 tablet (50 mg total) by mouth daily. 02/10/17   Ramonita Lab, MD  traZODone (DESYREL) 50 MG tablet Take 25 mg by mouth at bedtime.     [provider]  Vitamin D, Ergocalciferol, (DRISDOL) 50000 units CAPS capsule  Take 50,000 Units by mouth every 7 (seven) days.    [provider]    Allergies Other  Family History  Problem Relation Age of Onset  . Liver disease Father   . Heart disease Mother   . Kidney disease Neg Hx   . Prostate cancer Neg Hx   . Bladder Cancer Neg Hx     Social History Social History  Substance Use Topics  . Smoking status: Never Smoker  . Smokeless tobacco: Never Used  . Alcohol use No    Review of Systems level V exemption history Limited by the patient's dementia  ____________________________________________   PHYSICAL EXAM:  VITAL SIGNS: ED Triage Vitals  Enc Vitals Group     BP      Pulse      Resp      Temp      Temp src      SpO2      Weight      Height      Head Circumference      Peak Flow      Pain Score      Pain Loc      Pain Edu?      Excl. in GC?     Constitutional: nonverbal extremely tremulous no acute distress Eyes: PERRL EOMI.4 mm to 2 mm brisk Head: Atraumatic. Nose: No congestion/rhinnorhea. Mouth/Throat: No trismus Neck: No stridor.  no midline tenderness or step-offs Cardiovascular: Normal rate, regular rhythm. Grossly normal heart sounds.  Good peripheral circulation. Respiratory: Normal respiratory effort.  No retractions. Lungs CTAB and moving good air Gastrointestinal: soft nontender Musculoskeletal: No lower extremity edema   Neurologic:  moves all 4 extremities Skin:  Skin is warm, dry and intact. No rash noted. Psychiatric: profound dementia    ____________________________________________   DIFFERENTIAL includes but not limited to  mechanical fall, syncope, dysrhythmia, intracerebral hemorrhage, cervical spine fracture ____________________________________________   LABS (all labs ordered are listed, but only abnormal results are displayed)  Labs Reviewed - No data to display  ______________________________________  EKG  ED ECG REPORT I, Merrily Brittle, the attending physician,  personally viewed and interpreted this ECG.  Date: 03/20/2017 EKG Time:  Rate: 68 Rhythm: normal sinus rhythm QRS Axis: normal Intervals: normal ST/T Wave abnormalities: normal Narrative Interpretation: no evidence of acute ischemianonspecific intraventricular conduction delay _________________________________________  RADIOLOGY  CT scan is reviewed by me with chronic changes but no acute disease. ____________________________________________   PROCEDURES  Procedure(s) performed: no  Procedures  Critical Care performed: no  Observation: no ____________________________________________   INITIAL IMPRESSION / ASSESSMENT AND PLAN / ED COURSE  Pertinent labs & imaging results that were available during my care of the patient were reviewed by me and considered in my medical decision making (see chart for details).  The patient is chronically ill-appearingalthough with no objective signs of trauma. Head and C-spine  CTs are pending.    Fortunately the patient's neuro imaging is negative and he is moving all 4 extremities. He is asking to go back to his home.  the patient's daughter arrived at bedside shortly after discharge and expressed her concern that she did not like [facility] and would like [place] to admit her father overnight for observation. I offered to keep the patient in the emergency department overnight and speak with social work and have physical therapy evaluated in the morning, however the patient declined stating he felt safe at home and would like to go home now. He is discharged home in improved condition strict return precautions given.  ____________________________________________   FINAL CLINICAL IMPRESSION(S) / ED DIAGNOSES  Final diagnoses:  Fall, initial encounter      NEW MEDICATIONS STARTED DURING THIS VISIT:  Discharge Medication List as of 03/20/2017  9:51 PM       Note:  This document was prepared using Dragon voice recognition software and may  include unintentional dictation errors.     Merrily Brittle, MD 03/20/17 2332

## 2017-03-20 NOTE — ED Notes (Signed)
Daughter back to the room. [facility]ms has been called for transport. Daughter states that she would feel more comfortable if he was admitted for a night. Explained that there needed to be a medical reason to admit but I would send the MD in to speak with the family. Pt brought another chocolate milk per request.

## 2017-03-20 NOTE — ED Triage Notes (Signed)
Pt Via EMS from the [facility] [place] nursing home. Pt fell tonight off the end of the bed. Roommate called for help. No LOC. Second fall today. Pt is DNR

## 2017-03-21 ENCOUNTER — Encounter: Payer: Self-pay | Admitting: Emergency Medicine

## 2017-03-21 ENCOUNTER — Emergency Department
Admission: EM | Admit: 2017-03-21 | Discharge: 2017-03-21 | Disposition: A | Discharge status: Skilled Nursing Facility | Payer: Medicare Other | Attending: Emergency Medicine | Admitting: Emergency Medicine

## 2017-03-21 ENCOUNTER — Emergency Department: Payer: Medicare Other

## 2017-03-21 DIAGNOSIS — E039 Hypothyroidism, unspecified: Secondary | ICD-10-CM | POA: Insufficient documentation

## 2017-03-21 DIAGNOSIS — G20 Parkinson's disease: Secondary | ICD-10-CM | POA: Insufficient documentation

## 2017-03-21 DIAGNOSIS — I10 Essential (primary) hypertension: Secondary | ICD-10-CM | POA: Insufficient documentation

## 2017-03-21 DIAGNOSIS — R627 Adult failure to thrive: Secondary | ICD-10-CM | POA: Insufficient documentation

## 2017-03-21 DIAGNOSIS — R4182 Altered mental status, unspecified: Secondary | ICD-10-CM | POA: Diagnosis present

## 2017-03-21 LAB — CBC
HCT: 38.6 % — ABNORMAL LOW (ref 40.0–52.0)
Hemoglobin: 13.6 g/dL (ref 13.0–18.0)
MCH: 32.3 pg (ref 26.0–34.0)
MCHC: 35.2 g/dL (ref 32.0–36.0)
MCV: 91.7 fL (ref 80.0–100.0)
PLATELETS: 138 10*3/uL — AB (ref 150–440)
RBC: 4.21 MIL/uL — AB (ref 4.40–5.90)
RDW: 13.6 % (ref 11.5–14.5)
WBC: 5.4 10*3/uL (ref 3.8–10.6)

## 2017-03-21 LAB — COMPREHENSIVE METABOLIC PANEL
ALBUMIN: 4.5 g/dL (ref 3.5–5.0)
ALK PHOS: 39 U/L (ref 38–126)
ALT: 35 U/L (ref 17–63)
AST: 30 U/L (ref 15–41)
Anion gap: 8 (ref 5–15)
BILIRUBIN TOTAL: 0.8 mg/dL (ref 0.3–1.2)
BUN: 24 mg/dL — AB (ref 6–20)
CO2: 29 mmol/L (ref 22–32)
Calcium: 9.4 mg/dL (ref 8.9–10.3)
Chloride: 104 mmol/L (ref 101–111)
Creatinine, Ser: 0.81 mg/dL (ref 0.61–1.24)
GFR calc Af Amer: >60 mL/min (ref >60)
GFR calc non Af Amer: >60 mL/min (ref >60)
GLUCOSE: 96 mg/dL (ref 65–99)
POTASSIUM: 4 mmol/L (ref 3.5–5.1)
SODIUM: 141 mmol/L (ref 135–145)
TOTAL PROTEIN: 7.3 g/dL (ref 6.5–8.1)

## 2017-03-21 LAB — URINALYSIS, COMPLETE (UACMP) WITH MICROSCOPIC
BILIRUBIN URINE: NEGATIVE
Glucose, UA: NEGATIVE mg/dL
Hgb urine dipstick: NEGATIVE
KETONES UR: NEGATIVE mg/dL
LEUKOCYTES UA: NEGATIVE
Nitrite: NEGATIVE
PH: 6 (ref 5.0–8.0)
PROTEIN: NEGATIVE mg/dL
RBC / HPF: NONE SEEN RBC/hpf (ref 0–5)
SQUAMOUS EPITHELIAL / LPF: NONE SEEN
Specific Gravity, Urine: 1.015 (ref 1.005–1.030)

## 2017-03-21 LAB — GLUCOSE, CAPILLARY: GLUCOSE-CAPILLARY: 93 mg/dL (ref 65–99)

## 2017-03-21 LAB — TROPONIN I: Troponin I: 0.03 ng/mL (ref <0.03)

## 2017-03-21 MED ORDER — SODIUM CHLORIDE 0.9 % IV BOLUS (SEPSIS)
1000.0000 mL | Freq: Once | INTRAVENOUS | Status: AC
  Administered 2017-03-21: 1000 mL via INTRAVENOUS

## 2017-03-21 NOTE — ED Provider Notes (Signed)
[facility] Emergency Department Provider Note   ____________________________________________    I have reviewed the triage vital signs and the nursing notes.   HISTORY  Chief Complaint Altered Mental Status  History limited by patient's dementia   HPI Anthony Blankenship is a 83 y.o. male who presents with failure to thrive and possible altered mental status. Patient's daughter reports that he has been declining over many months. Was seen in the emergency department yesterday status post fall, CT scans were unremarkable and was discharged back to nursing home. ED physician offered to have social work see the patient but they declined. Daughter is adamant that the patient needs to be admitted for unclear reasons   Past Medical History:  Diagnosis Date  . Anxiety   . Coronary artery disease   . Depression   . Hypertension   . Hypothyroidism   . Kidney stones   . Parkinson's disease (HCC)   . Restless leg syndrome     Patient Active Problem List   Diagnosis Date Noted  . Oropharyngeal dysphagia   . Palliative care encounter   . Altered mental status 02/08/2017  . Frailty 12/14/2016  . Orthostatic hypotension 12/14/2016  . Parkinson's disease (HCC)   . Palliative care by specialist   . Goals of care, counseling/discussion   . Gait instability 09/13/2016  . Recurrent falls 09/13/2016  . Coronary artery disease involving native coronary artery of native heart with angina pectoris (HCC) 09/08/2016  . Near syncope 06/12/2016  . Lower GI bleed 04/25/2016  . GI bleed 04/25/2016  . Restlessness 05/14/2015    Past Surgical History:  Procedure Laterality Date  . APPENDECTOMY    . CARDIAC CATHETERIZATION    . CORONARY ARTERY BYPASS GRAFT      Prior to Admission medications   Medication Sig Start Date End Date Taking? Authorizing Provider  acetaminophen (TYLENOL) 325 MG tablet Take 650 mg by mouth every 6 (six) hours as needed for mild pain.      [provider]  barrier cream (NON-SPECIFIED) CREA Apply 1 application topically as needed.    [provider]  carbidopa-levodopa-entacapone (STALEVO) 31.25-125-200 MG tablet Take 1 tablet by mouth See admin instructions. Take one tablet by mouth seven times daily    [provider]  diazepam (VALIUM) 2 MG tablet Take 1 tablet (2 mg total) by mouth every 12 (twelve) hours as needed for anxiety. 09/16/16   Delfino Lovett, MD  diclofenac sodium (VOLTAREN) 1 % GEL Apply 1 g topically 3 (three) times daily.    [provider]  diphenhydrAMINE (BENADRYL) 25 MG tablet Take 25 mg by mouth every 6 (six) hours as needed.    [provider]  levothyroxine (SYNTHROID, LEVOTHROID) 75 MCG tablet Take 75 mcg by mouth daily.     [provider]  lidocaine (ASPERCREME W/LIDOCAINE) 4 % cream Apply 1 application topically 3 (three) times daily as needed.    [provider]  loperamide (IMODIUM A-D) 2 MG tablet Take 2 mg by mouth as needed for diarrhea or loose stools. Do not exceed 8 doses in 24 hours    [provider]  magnesium hydroxide (MILK OF MAGNESIA) 400 MG/5ML suspension Take 30 mLs by mouth daily as needed for mild constipation.    [provider]  Melatonin 3 MG TABS Take 1 tablet by mouth at bedtime.    [provider]  primidone (MYSOLINE) 50 MG tablet Take 50 mg by mouth at bedtime.  [provider]  senna-docusate (SENOKOT-S) 8.6-50 MG tablet Take 1 tablet by mouth at bedtime as needed for mild constipation. Patient not taking: Reported on 03/12/2017 02/09/17   Ramonita Lab, MD  sertraline (ZOLOFT) 50 MG tablet Take 1 tablet (50 mg total) by mouth daily. 02/10/17   Ramonita Lab, MD  traZODone (DESYREL) 50 MG tablet Take 25 mg by mouth at bedtime.     [provider]  Vitamin D, Ergocalciferol, (DRISDOL) 50000 units CAPS capsule Take 50,000 Units by mouth every 7 (seven) days.    [provider]     Allergies Other  Family History  Problem Relation Age of Onset  . Liver disease Father   . Heart disease Mother   . Kidney disease Neg Hx   . Prostate cancer Neg Hx   . Bladder Cancer Neg Hx     Social History Social History  Substance Use Topics  . Smoking status: Never Smoker  . Smokeless tobacco: Never Used  . Alcohol use No      Level V caveat: Unable to obtain Review of Systems due to dementia     ____________________________________________   PHYSICAL EXAM:  VITAL SIGNS: ED Triage Vitals  Enc Vitals Group     BP 03/21/17 1322 (!) 184/95     Pulse Rate 03/21/17 1322 67     Resp 03/21/17 1322 20     Temp 03/21/17 1322 98.1 F (36.7 C)     Temp Source 03/21/17 1322 Axillary     SpO2 03/21/17 1322 100 %     Weight 03/21/17 1307 63.5 kg (140 lb)     Height 03/21/17 1307 1.829 m (6')     Head Circumference --      Peak Flow --      Pain Score --      Pain Loc --      Pain Edu? --      Excl. in GC? --     Constitutional:  No acute distress.  Eyes: Conjunctivae are normal. PERRLA Head: Atraumatic. Nose: No nasal swelling Mouth/Throat: Mucous membranes are moist.   Neck:  Painless ROM, no vertebral tenderness to palpation Cardiovascular: Normal rate, regular rhythm. Grossly normal heart sounds.  Good peripheral circulation. Respiratory: Normal respiratory effort.  No retractions. Lungs CTAB. Gastrointestinal: Soft and nontender. No distention.  No CVA tenderness. Genitourinary: deferred Musculoskeletal:  Warm and well perfused Neurologic:   No gross focal neurologic deficits are appreciated.    ____________________________________________   LABS (all labs ordered are listed, but only abnormal results are displayed)  Labs Reviewed  COMPREHENSIVE METABOLIC PANEL - Abnormal; Notable for the following:       Result Value   BUN 24 (*)    All other components within normal limits  TROPONIN I - Abnormal; Notable for the  following:    Troponin I 0.03 (*)    All other components within normal limits  URINALYSIS, COMPLETE (UACMP) WITH MICROSCOPIC - Abnormal; Notable for the following:    Color, Urine YELLOW (*)    APPearance CLEAR (*)    Bacteria, UA RARE (*)    All other components within normal limits  CBC - Abnormal; Notable for the following:    RBC 4.21 (*)    HCT 38.6 (*)    Platelets 138 (*)    All other components within normal limits  GLUCOSE, CAPILLARY  CBG MONITORING, ED   ____________________________________________  EKG  ED ECG REPORT I, Jene Every, the attending physician, personally  viewed and interpreted this ECG.  Date: 03/21/2017  Rhythm: normal sinus rhythm QRS Axis: normal Intervals: normal ST/T Wave abnormalities: normal Narrative Interpretation: Limited secondary to tremors  ____________________________________________  RADIOLOGY  X-ray unremarkable ____________________________________________   PROCEDURES  Procedure(s) performed: No    Critical Care performed: No ____________________________________________   INITIAL IMPRESSION / ASSESSMENT AND PLAN / ED COURSE  Pertinent labs & imaging results that were available during my care of the patient were reviewed by me and considered in my medical decision making (see chart for details).  Patient with a history of dementia presents with what appears to be failure to thrive and gradual decline. Daughter is quite anxious and even when I described to her that there is no point to hospitalization if there is nothing for [place] to treat she is still insisting on admission.  I reviewed CT scans from yesterday, generalized age-related atrophy. Vital signs today are unremarkable we will check labs urine chest x-ray and reevaluate  ----------------------------------------- 3:13 PM on 03/21/2017 -----------------------------------------  Patient's lab work is unremarkable, urinalysis is normal, chest x-ray benign.  Discussed with nursing home, they reports several weeks of decline, hospice referral has been placed and palliative care was out to see him yesterday. No indication for hospitalization at this time, discussed with daughter who is clearly having difficulty coping but agrees with this plan ____________________________________________   FINAL CLINICAL IMPRESSION(S) / ED DIAGNOSES  Final diagnoses:  Failure to thrive in adult      NEW MEDICATIONS STARTED DURING THIS VISIT:  New Prescriptions   No medications on file     Note:  This document was prepared using Dragon voice recognition software and may include unintentional dictation errors.    Jene Every, MD 03/21/17 [phone]

## 2017-03-21 NOTE — ED Triage Notes (Signed)
Patient from [facility] via [facility]. Staff reports patient had fall yesterday and was seen in ED. Reports since fall, patient has had decreased responsiveness. Upon arrival, patient is able to follow commands and oriented to self. Even, unlabored respirations noted.

## 2017-03-21 NOTE — ED Notes (Signed)
Called [facility] for report. EMS with patient for transport

## 2017-03-22 ENCOUNTER — Encounter: Payer: Self-pay | Admitting: Emergency Medicine

## 2017-03-22 ENCOUNTER — Emergency Department: Payer: Medicare Other

## 2017-03-22 ENCOUNTER — Observation Stay
Admission: EM | Admit: 2017-03-22 | Discharge: 2017-03-23 | Disposition: A | Discharge status: Hospice | Payer: Medicare Other | Attending: Internal Medicine | Admitting: Internal Medicine

## 2017-03-22 DIAGNOSIS — G3189 Other specified degenerative diseases of nervous system: Secondary | ICD-10-CM | POA: Insufficient documentation

## 2017-03-22 DIAGNOSIS — I700 Atherosclerosis of aorta: Secondary | ICD-10-CM | POA: Insufficient documentation

## 2017-03-22 DIAGNOSIS — Z681 Body mass index (BMI) 19 or less, adult: Secondary | ICD-10-CM | POA: Insufficient documentation

## 2017-03-22 DIAGNOSIS — I6782 Cerebral ischemia: Secondary | ICD-10-CM | POA: Diagnosis not present

## 2017-03-22 DIAGNOSIS — G309 Alzheimer's disease, unspecified: Secondary | ICD-10-CM | POA: Insufficient documentation

## 2017-03-22 DIAGNOSIS — Z951 Presence of aortocoronary bypass graft: Secondary | ICD-10-CM | POA: Diagnosis not present

## 2017-03-22 DIAGNOSIS — M47812 Spondylosis without myelopathy or radiculopathy, cervical region: Secondary | ICD-10-CM | POA: Diagnosis not present

## 2017-03-22 DIAGNOSIS — F329 Major depressive disorder, single episode, unspecified: Secondary | ICD-10-CM | POA: Diagnosis not present

## 2017-03-22 DIAGNOSIS — Z79899 Other long term (current) drug therapy: Secondary | ICD-10-CM | POA: Diagnosis not present

## 2017-03-22 DIAGNOSIS — R627 Adult failure to thrive: Secondary | ICD-10-CM | POA: Diagnosis not present

## 2017-03-22 DIAGNOSIS — G20 Parkinson's disease: Secondary | ICD-10-CM | POA: Diagnosis not present

## 2017-03-22 DIAGNOSIS — F419 Anxiety disorder, unspecified: Secondary | ICD-10-CM | POA: Insufficient documentation

## 2017-03-22 DIAGNOSIS — G2581 Restless legs syndrome: Secondary | ICD-10-CM | POA: Diagnosis not present

## 2017-03-22 DIAGNOSIS — F0280 Dementia in other diseases classified elsewhere without behavioral disturbance: Secondary | ICD-10-CM | POA: Insufficient documentation

## 2017-03-22 DIAGNOSIS — Z66 Do not resuscitate: Secondary | ICD-10-CM | POA: Diagnosis not present

## 2017-03-22 DIAGNOSIS — M50322 Other cervical disc degeneration at C5-C6 level: Secondary | ICD-10-CM | POA: Insufficient documentation

## 2017-03-22 DIAGNOSIS — Z87442 Personal history of urinary calculi: Secondary | ICD-10-CM | POA: Insufficient documentation

## 2017-03-22 DIAGNOSIS — I10 Essential (primary) hypertension: Secondary | ICD-10-CM | POA: Insufficient documentation

## 2017-03-22 DIAGNOSIS — E039 Hypothyroidism, unspecified: Secondary | ICD-10-CM | POA: Diagnosis not present

## 2017-03-22 DIAGNOSIS — I2510 Atherosclerotic heart disease of native coronary artery without angina pectoris: Secondary | ICD-10-CM | POA: Diagnosis not present

## 2017-03-22 DIAGNOSIS — R4182 Altered mental status, unspecified: Secondary | ICD-10-CM

## 2017-03-22 DIAGNOSIS — R6251 Failure to thrive (child): Secondary | ICD-10-CM | POA: Diagnosis present

## 2017-03-22 DIAGNOSIS — Z888 Allergy status to other drugs, medicaments and biological substances status: Secondary | ICD-10-CM | POA: Diagnosis not present

## 2017-03-22 LAB — COMPREHENSIVE METABOLIC PANEL
ALK PHOS: 37 U/L — AB (ref 38–126)
ALT: 35 U/L (ref 17–63)
ANION GAP: 5 (ref 5–15)
AST: 32 U/L (ref 15–41)
Albumin: 3.8 g/dL (ref 3.5–5.0)
BILIRUBIN TOTAL: 0.8 mg/dL (ref 0.3–1.2)
BUN: 28 mg/dL — ABNORMAL HIGH (ref 6–20)
CALCIUM: 9.3 mg/dL (ref 8.9–10.3)
CO2: 29 mmol/L (ref 22–32)
CREATININE: 1.1 mg/dL (ref 0.61–1.24)
Chloride: 107 mmol/L (ref 101–111)
GFR, EST NON AFRICAN AMERICAN: 57 mL/min — AB (ref >60)
Glucose, Bld: 95 mg/dL (ref 65–99)
Potassium: 4.5 mmol/L (ref 3.5–5.1)
SODIUM: 141 mmol/L (ref 135–145)
TOTAL PROTEIN: 6.3 g/dL — AB (ref 6.5–8.1)

## 2017-03-22 LAB — CBC WITH DIFFERENTIAL/PLATELET
Basophils Absolute: 0 10*3/uL (ref 0–0.1)
Basophils Relative: 1 %
EOS ABS: 0.1 10*3/uL (ref 0–0.7)
Eosinophils Relative: 2 %
HCT: 38.9 % — ABNORMAL LOW (ref 40.0–52.0)
Hemoglobin: 13.5 g/dL (ref 13.0–18.0)
LYMPHS ABS: 1.6 10*3/uL (ref 1.0–3.6)
LYMPHS PCT: 25 %
MCH: 32.6 pg (ref 26.0–34.0)
MCHC: 34.7 g/dL (ref 32.0–36.0)
MCV: 93.8 fL (ref 80.0–100.0)
MONOS PCT: 6 %
Monocytes Absolute: 0.4 10*3/uL (ref 0.2–1.0)
NEUTROS PCT: 66 %
Neutro Abs: 4.3 10*3/uL (ref 1.4–6.5)
Platelets: 148 10*3/uL — ABNORMAL LOW (ref 150–440)
RBC: 4.15 MIL/uL — AB (ref 4.40–5.90)
RDW: 13.6 % (ref 11.5–14.5)
WBC: 6.5 10*3/uL (ref 3.8–10.6)

## 2017-03-22 LAB — TSH: TSH: 4.875 u[IU]/mL — ABNORMAL HIGH (ref 0.350–4.500)

## 2017-03-22 LAB — TROPONIN I

## 2017-03-22 MED ORDER — SODIUM CHLORIDE 0.9 % IV SOLN
Freq: Once | INTRAVENOUS | Status: AC
Start: 2017-03-22 — End: 2017-03-23
  Administered 2017-03-22: 17:00:00 via INTRAVENOUS

## 2017-03-22 MED ORDER — SENNOSIDES-DOCUSATE SODIUM 8.6-50 MG PO TABS: 1.0000 | ORAL_TABLET | Freq: Every evening | ORAL | Status: DC | PRN

## 2017-03-22 MED ORDER — MAGNESIUM HYDROXIDE 400 MG/5ML PO SUSP
30.0000 mL | Freq: Every day | ORAL | Status: DC | PRN
  Filled 2017-03-22: qty 30

## 2017-03-22 MED ORDER — ENTACAPONE 200 MG PO TABS
200.0000 mg | ORAL_TABLET | ORAL | Status: DC
  Filled 2017-03-22 (×8): qty 1

## 2017-03-22 MED ORDER — ENOXAPARIN SODIUM 40 MG/0.4ML ~~LOC~~ SOLN
40.0000 mg | SUBCUTANEOUS | Status: DC
  Filled 2017-03-22: qty 0.4

## 2017-03-22 MED ORDER — CARBIDOPA-LEVODOPA 10-100 MG PO TABS
0.5000 | ORAL_TABLET | ORAL | Status: DC
  Filled 2017-03-22 (×8): qty 0.5

## 2017-03-22 MED ORDER — PRIMIDONE 50 MG PO TABS
50.0000 mg | ORAL_TABLET | Freq: Every day | ORAL | Status: DC
  Filled 2017-03-22 (×2): qty 1

## 2017-03-22 MED ORDER — ONDANSETRON HCL 4 MG/2ML IJ SOLN: 4.0000 mg | Freq: Four times a day (QID) | INTRAMUSCULAR | Status: DC | PRN

## 2017-03-22 MED ORDER — SERTRALINE HCL 50 MG PO TABS: 50.0000 mg | ORAL_TABLET | Freq: Every day | ORAL | Status: DC

## 2017-03-22 MED ORDER — CARBIDOPA-LEVODOPA 25-100 MG PO TABS
1.0000 | ORAL_TABLET | ORAL | Status: DC
  Filled 2017-03-22 (×8): qty 1

## 2017-03-22 MED ORDER — MELATONIN 5 MG PO TABS
2.5000 mg | ORAL_TABLET | Freq: Every day | ORAL | Status: DC
  Filled 2017-03-22 (×2): qty 0.5

## 2017-03-22 MED ORDER — LEVOTHYROXINE SODIUM 50 MCG PO TABS: 75.0000 ug | ORAL_TABLET | Freq: Every day | ORAL | Status: DC

## 2017-03-22 MED ORDER — CARBIDOPA-LEVODOPA-ENTACAPONE 31.25-125-200 MG PO TABS: 1.0000 | ORAL_TABLET | ORAL | Status: DC

## 2017-03-22 MED ORDER — ACETAMINOPHEN 325 MG PO TABS: 650.0000 mg | ORAL_TABLET | Freq: Four times a day (QID) | ORAL | Status: DC | PRN

## 2017-03-22 MED ORDER — TRAZODONE HCL 50 MG PO TABS
25.0000 mg | ORAL_TABLET | Freq: Every day | ORAL | Status: DC
  Filled 2017-03-22: qty 1

## 2017-03-22 MED ORDER — ONDANSETRON HCL 4 MG PO TABS: 4.0000 mg | ORAL_TABLET | Freq: Four times a day (QID) | ORAL | Status: DC | PRN

## 2017-03-22 MED ORDER — DICLOFENAC SODIUM 1 % TD GEL
2.0000 g | Freq: Three times a day (TID) | TRANSDERMAL | Status: DC
  Filled 2017-03-22: qty 100

## 2017-03-22 NOTE — Progress Notes (Signed)
Please note patient is to be admitted to hospice services tomorrow at [facility]. Hospice team aware that patient is currently in the ED. Thank you. Dayna Barker RN, BSN, [facility],  [phone]

## 2017-03-22 NOTE — H&P (Signed)
[facility] [place] [facility]   PATIENT NAME: Anthony Blankenship    MR#:  [id number]  DATE OF BIRTH:  Oct 06, 1924  DATE OF ADMISSION:  03/22/2017  PRIMARY CARE PHYSICIAN: Royetta Asal, MD   REQUESTING/REFERRING PHYSICIAN: Dr. Gladstone Pih  CHIEF COMPLAINT:   Chief Complaint  Patient presents with  . Weakness    HISTORY OF PRESENT ILLNESS:  Anthony Blankenship  is a 83 y.o. male with a known history of worsening Alzheimer's dementia, CAD, Parkinson's disease, hypertension, anxiety presents from the [facility] assisted living facility secondary to failure to thrive. Patient was in the hospital for similar reason last week and was discharged to be followed by hospice services. Hospice was supposed to start tomorrow. Since yesterday, patient's intake has been decreased completely,he is more sleepy and lethargic and difficult to be aroused and so brought back to the emergency room. He had another visit to the ER last night, since his labs were fine and he woke up he was discharged. Same thing happened and is brought back again. Discussed with daughter about worsening disease process and considering hospice facility at discharge. She is agreeable.We'll have care manager consult and hospice consult for this same.   PAST MEDICAL HISTORY:   Past Medical History:  Diagnosis Date  . Anxiety   . Coronary artery disease   . Depression   . Hypertension   . Hypothyroidism   . Kidney stones   . Parkinson's disease (HCC)   . Restless leg syndrome     PAST SURGICAL HISTORY:   Past Surgical History:  Procedure Laterality Date  . APPENDECTOMY    . CARDIAC CATHETERIZATION    . CORONARY ARTERY BYPASS GRAFT      SOCIAL HISTORY:   Social History  Substance Use Topics  . Smoking status: Never Smoker  . Smokeless tobacco: Never Used  . Alcohol use No    FAMILY HISTORY:   Family History  Problem Relation Age of Onset  . Liver disease Father   . Heart disease Mother   .  Kidney disease Neg Hx   . Prostate cancer Neg Hx   . Bladder Cancer Neg Hx     DRUG ALLERGIES:   Allergies  Allergen Reactions  . Other Other (See Comments)    Uncoded Allergy. Allergen: Sleeping Medications    REVIEW OF SYSTEMS:   Review of Systems  Unable to perform ROS: Dementia    MEDICATIONS AT HOME:   Prior to Admission medications   Medication Sig Start Date End Date Taking? Authorizing Provider  carbidopa-levodopa-entacapone (STALEVO) 31.25-125-200 MG tablet Take 1 tablet by mouth See admin instructions. Take one tablet by mouth seven times daily   Yes [provider]  diclofenac sodium (VOLTAREN) 1 % GEL Apply 2 g topically 3 (three) times daily.    Yes [provider]  levothyroxine (SYNTHROID, LEVOTHROID) 75 MCG tablet Take 75 mcg by mouth daily.    Yes [provider]  Melatonin 3 MG TABS Take 1 tablet by mouth at bedtime.   Yes [provider]  primidone (MYSOLINE) 50 MG tablet Take 50 mg by mouth at bedtime.   Yes [provider]  sertraline (ZOLOFT) 50 MG tablet Take 1 tablet (50 mg total) by mouth daily. 02/10/17  Yes Gouru, Deanna Artis, MD  traZODone (DESYREL) 50 MG tablet Take 25 mg by mouth at bedtime.    Yes [provider]  acetaminophen (TYLENOL) 325 MG tablet Take 650 mg by mouth every 6 (six)  hours as needed for mild pain.     [provider]  barrier cream (NON-SPECIFIED) CREA Apply 1 application topically as needed.    [provider]  diazepam (VALIUM) 2 MG tablet Take 1 tablet (2 mg total) by mouth every 12 (twelve) hours as needed for anxiety. Patient not taking: Reported on 03/22/2017 09/16/16   Delfino Lovett, MD  diphenhydrAMINE (BENADRYL) 25 MG tablet Take 25 mg by mouth every 6 (six) hours as needed.    [provider]  guaiFENesin (ROBITUSSIN) 100 MG/5ML liquid Take 300 mg by mouth every 6 (six) hours as needed for cough.    [provider]  loperamide (IMODIUM A-D) 2  MG tablet Take 2 mg by mouth as needed for diarrhea or loose stools. Do not exceed 8 doses in 24 hours    [provider]  magnesium hydroxide (MILK OF MAGNESIA) 400 MG/5ML suspension Take 30 mLs by mouth daily as needed for mild constipation.    [provider]  senna-docusate (SENOKOT-S) 8.6-50 MG tablet Take 1 tablet by mouth at bedtime as needed for mild constipation. 02/09/17   Ramonita Lab, MD      VITAL SIGNS:  Pulse 62, resp. rate (!) 21, height 6' (1.829 m), weight 63.5 kg (140 lb), SpO2 97 %.  PHYSICAL EXAMINATION:   Physical Exam  GENERAL:  83 y.o.-year-old elderly  patient lying in the bed with no acute distress.  EYES: left pupil equal, round, reactive to light and accommodation. Right eye is closed, No scleral icterus. Extraocular muscles intact.  HEENT: Head atraumatic, normocephalic. Oropharynx and nasopharynx clear.  NECK:  Supple, no jugular venous distention. No thyroid enlargement, no tenderness.  LUNGS: coarse breath sounds bilaterally, no wheezing, rales,rhonchi or crepitation. No use of accessory muscles of respiration.  CARDIOVASCULAR: S1, S2 normal. No rubs, or gallops. 2/6 systolic murmur present ABDOMEN: Soft, nontender, nondistended. Bowel sounds present. No organomegaly or mass.  EXTREMITIES: No pedal edema, cyanosis, or clubbing.  NEUROLOGIC: significant resting tremors noted, not following commands, alert.  PSYCHIATRIC: The patient is alert and repeating 'help', not following commands.  SKIN: No obvious rash, lesion, or ulcer.   LABORATORY PANEL:   CBC  Recent Labs Lab 03/22/17 1628  WBC 6.5  HGB 13.5  HCT 38.9*  PLT 148*   ------------------------------------------------------------------------------------------------------------------  Chemistries   Recent Labs Lab 03/22/17 1628  NA 141  K 4.5  CL 107  CO2 29  GLUCOSE 95  BUN 28*  CREATININE 1.10  CALCIUM 9.3  AST 32  ALT 35  ALKPHOS 37*  BILITOT 0.8    ------------------------------------------------------------------------------------------------------------------  Cardiac Enzymes  Recent Labs Lab 03/22/17 1628  TROPONINI <0.03   ------------------------------------------------------------------------------------------------------------------  RADIOLOGY:  Dg Chest 1 View  Result Date: 03/22/2017 CLINICAL DATA:  Increase weakness today with episodes of vomiting. History of coronary artery disease, Parkinson's disease. EXAM: CHEST 1 VIEW COMPARISON:  Portable chest x-ray of March 21, 2017 FINDINGS: The lungs are adequately inflated and clear. There is mild chronic dilation of the aortic arch. There is calcification within its wall. The heart and pulmonary vascularity are normal. The patient has undergone previous CABG. There is no pleural effusion. The bony thorax exhibits no acute abnormality. IMPRESSION: There is no acute cardiopulmonary abnormality. Previous CABG.  Thoracic aortic atherosclerosis. Electronically Signed   By: David  [place] M.D.   On: 03/22/2017 16:47   Ct Head Wo Contrast  Result Date: 03/22/2017 CLINICAL DATA:  Increased weakness in episodes of vomiting today, fell yesterday, failure to  thrive, altered mental status, declining status over many months, history hypertension, Parkinson's, coronary artery disease EXAM: CT HEAD WITHOUT CONTRAST TECHNIQUE: Contiguous axial images were obtained from the base of the skull through the vertex without intravenous contrast. COMPARISON:  03/20/2017 FINDINGS: Brain: Generalized atrophy. Normal ventricular morphology. No midline shift or mass effect. Small vessel chronic ischemic changes of deep cerebral white matter. No intracranial hemorrhage, mass lesion, or evidence of acute infarction. No extra-axial fluid collections. Vascular: Atherosclerotic calcification of internal carotid arteries at skullbase Skull: Intact Sinuses/Orbits: Clear Other: N/A IMPRESSION: Atrophy with small  vessel chronic ischemic changes of deep cerebral white matter. No acute intracranial abnormalities. No significant interval change. Electronically Signed   By: Ulyses Southward M.D.   On: 03/22/2017 17:03   Ct Head Wo Contrast  Result Date: 03/20/2017 CLINICAL DATA:  Initial evaluation for acute head trauma, ataxia. EXAM: CT HEAD WITHOUT CONTRAST CT CERVICAL SPINE WITHOUT CONTRAST TECHNIQUE: Multidetector CT imaging of the head and cervical spine was performed following the standard protocol without intravenous contrast. Multiplanar CT image reconstructions of the cervical spine were also generated. COMPARISON:  P[facility] CT from 03/15/2017. FINDINGS: CT HEAD FINDINGS Brain: Generalized age related cerebral atrophy. Mild chronic small vessel ischemic disease. No acute intracranial hemorrhage. No evidence for acute large vessel territory infarct. No mass lesion, midline shift or mass effect. No hydrocephalus. No extra-axial fluid collection. Vascular: No hyperdense vessel. Scattered vascular calcifications noted within the carotid siphons. Skull: Scalp soft tissues and calvarium within normal limits. Sinuses/Orbits: Globes and orbital soft tissues demonstrate no acute abnormality. Paranasal sinuses and mastoid air cells are clear. Other: None. CT CERVICAL SPINE FINDINGS Alignment: Vertebral bodies normally aligned with preservation of the normal cervical lordosis. Trace anterolisthesis of C7 on T1, stable, and likely due to chronic facet degeneration. Skull base and vertebrae: Skullbase intact. Normal C1-2 articulations are preserved in the dens is intact. Vertebral body heights maintained. The no acute fracture. Soft tissues and spinal canal: Soft tissues of the neck demonstrate no acute abnormality. Vascular calcifications about the carotid bifurcations. No prevertebral edema. Disc levels: Moderate multilevel degenerative spondylolysis, greatest at C5-6 and C6-7. Prominent degenerative changes with calcification noted  about the C1-2 articulation. Upper chest: Visualized upper chest demonstrates no acute abnormality. Partially visualized lungs are clear. No apical pneumothorax. Other: None. IMPRESSION: CT BRAIN: 1. No acute intracranial process. 2. Stable atrophy with chronic microvascular ischemic disease. CT CERVICAL SPINE: 1. No acute traumatic injury within the cervical spine. 2. Moderate multilevel degenerative spondylolysis, greatest at C5-6 and C6-7. Electronically Signed   By: Rise Mu M.D.   On: 03/20/2017 21:37   Ct Cervical Spine Wo Contrast  Result Date: 03/20/2017 CLINICAL DATA:  Initial evaluation for acute head trauma, ataxia. EXAM: CT HEAD WITHOUT CONTRAST CT CERVICAL SPINE WITHOUT CONTRAST TECHNIQUE: Multidetector CT imaging of the head and cervical spine was performed following the standard protocol without intravenous contrast. Multiplanar CT image reconstructions of the cervical spine were also generated. COMPARISON:  Priors CT from 03/15/2017. FINDINGS: CT HEAD FINDINGS Brain: Generalized age related cerebral atrophy. Mild chronic small vessel ischemic disease. No acute intracranial hemorrhage. No evidence for acute large vessel territory infarct. No mass lesion, midline shift or mass effect. No hydrocephalus. No extra-axial fluid collection. Vascular: No hyperdense vessel. Scattered vascular calcifications noted within the carotid siphons. Skull: Scalp soft tissues and calvarium within normal limits. Sinuses/Orbits: Globes and orbital soft tissues demonstrate no acute abnormality. Paranasal sinuses and mastoid air cells are clear. Other: None. CT CERVICAL  SPINE FINDINGS Alignment: Vertebral bodies normally aligned with preservation of the normal cervical lordosis. Trace anterolisthesis of C7 on T1, stable, and likely due to chronic facet degeneration. Skull base and vertebrae: Skullbase intact. Normal C1-2 articulations are preserved in the dens is intact. Vertebral body heights maintained.  The no acute fracture. Soft tissues and spinal canal: Soft tissues of the neck demonstrate no acute abnormality. Vascular calcifications about the carotid bifurcations. No prevertebral edema. Disc levels: Moderate multilevel degenerative spondylolysis, greatest at C5-6 and C6-7. Prominent degenerative changes with calcification noted about the C1-2 articulation. Upper chest: Visualized upper chest demonstrates no acute abnormality. Partially visualized lungs are clear. No apical pneumothorax. Other: None. IMPRESSION: CT BRAIN: 1. No acute intracranial process. 2. Stable atrophy with chronic microvascular ischemic disease. CT CERVICAL SPINE: 1. No acute traumatic injury within the cervical spine. 2. Moderate multilevel degenerative spondylolysis, greatest at C5-6 and C6-7. Electronically Signed   By: Rise Mu M.D.   On: 03/20/2017 21:37   Dg Chest Portable 1 View  Result Date: 03/21/2017 CLINICAL DATA:  Weakness EXAM: PORTABLE CHEST 1 VIEW COMPARISON:  03/15/2017 FINDINGS: Cardiac shadow is stable. Postoperative changes are again seen. Patient is rotated to the left. No focal infiltrate or sizable effusion is seen. Small calcified granuloma is noted in the right mid lung. No acute bony abnormality is seen. IMPRESSION: No acute abnormality noted. Aortic Atherosclerosis (ICD10-170.0) Electronically Signed   By: Alcide Clever M.D.   On: 03/21/2017 13:58    EKG:   Orders placed or performed during the hospital encounter of 03/22/17  . EKG 12-Lead  . EKG 12-Lead    IMPRESSION AND PLAN:   Anthony Blankenship  is a 83 y.o. male with a known history of worsening Alzheimer's dementia, CAD, Parkinson's disease, hypertension, anxiety presents from the [place] assisted living facility secondary to failure to thrive.  #1 failure to thrive-secondary to progressively worsening dementia and Parkinson's disease. -If oral intake remains poor, consider hospice home transfer Have discussed with daughter Ms.  [name] over the phone, she seems very reasonable and agreeable with the plan -soft diet as tolerated  #2 Parkinson's disease-has significant resting tremors. If able to take medications, continue stalevo and primidone  #3 dementia and depression- progressively worsening. Continue home medications  #4 hypothyroidism-on Synthroid  #5 DVT prophylaxis-Lovenox   All the records are reviewed and case discussed with ED provider. Management plans discussed with the patient, family and they are in agreement.  CODE STATUS: DNR  TOTAL TIME TAKING CARE OF THIS PATIENT: 50 minutes.    Enid Baas M.D on 03/22/2017 at 6:41 PM  Between 7am to 6pm - Pager - [phone]  After 6pm go to [email or web address] - password [email or web address]  [facility]  Office  [phone]  CC: Primary care physician; Royetta Asal, MD

## 2017-03-22 NOTE — ED Triage Notes (Signed)
Pt to ED by EMS from [facility]. Per staff the pt has increased weakness and episodes of emesis today.

## 2017-03-22 NOTE — Progress Notes (Signed)
Pharmacist - Prescriber Communication  Enoxaparin dose modified from 30 mg subcutaneously once daily to 40 mg subcutaneously once daily due to ABW > 50 kg and CrCl > 30 mL/min.   Akeylah Hendel A. [place], [place].D., BCPS Clinical Pharmacist 03/22/2017 20:09

## 2017-03-22 NOTE — ED Provider Notes (Signed)
[facility] Emergency Department Provider Note  ____________________________________________   First MD Initiated Contact with Patient 03/22/17 [phone]     (approximate)  I have reviewed the triage vital signs and the nursing notes.   HISTORY  Chief Complaint Weakness   HPI Anthony Blankenship is a 83 y.o. male with a history of Parkinson's disease as well as worsening mental status over the past several weeks was presenting to the emergency department with failure to thrive as well as worsening mental status. I spoke with his daughter, Ms. Anthony Blankenship, says that the patient was barely able to talk today and has been acutely worsens visiting the emergency department yesterday. I am unable to gain further history from the patient at this time because of his altered mental status. Also with report of vomiting prior to arrival but no reports of coffee-ground emesis.   Past Medical History:  Diagnosis Date  . Anxiety   . Coronary artery disease   . Depression   . Hypertension   . Hypothyroidism   . Kidney stones   . Parkinson's disease (HCC)   . Restless leg syndrome     Patient Active Problem List   Diagnosis Date Noted  . Oropharyngeal dysphagia   . Palliative care encounter   . Altered mental status 02/08/2017  . Frailty 12/14/2016  . Orthostatic hypotension 12/14/2016  . Parkinson's disease (HCC)   . Palliative care by specialist   . Goals of care, counseling/discussion   . Gait instability 09/13/2016  . Recurrent falls 09/13/2016  . Coronary artery disease involving native coronary artery of native heart with angina pectoris (HCC) 09/08/2016  . Near syncope 06/12/2016  . Lower GI bleed 04/25/2016  . GI bleed 04/25/2016  . Restlessness 05/14/2015    Past Surgical History:  Procedure Laterality Date  . APPENDECTOMY    . CARDIAC CATHETERIZATION    . CORONARY ARTERY BYPASS GRAFT      Prior to Admission medications   Medication Sig Start Date  End Date Taking? Authorizing Provider  carbidopa-levodopa-entacapone (STALEVO) 31.25-125-200 MG tablet Take 1 tablet by mouth See admin instructions. Take one tablet by mouth seven times daily   Yes [provider]  diclofenac sodium (VOLTAREN) 1 % GEL Apply 2 g topically 3 (three) times daily.    Yes [provider]  levothyroxine (SYNTHROID, LEVOTHROID) 75 MCG tablet Take 75 mcg by mouth daily.    Yes [provider]  Melatonin 3 MG TABS Take 1 tablet by mouth at bedtime.   Yes [provider]  primidone (MYSOLINE) 50 MG tablet Take 50 mg by mouth at bedtime.   Yes [provider]  sertraline (ZOLOFT) 50 MG tablet Take 1 tablet (50 mg total) by mouth daily. 02/10/17  Yes Gouru, Deanna Artis, MD  traZODone (DESYREL) 50 MG tablet Take 25 mg by mouth at bedtime.    Yes [provider]  acetaminophen (TYLENOL) 325 MG tablet Take 650 mg by mouth every 6 (six) hours as needed for mild pain.     [provider]  barrier cream (NON-SPECIFIED) CREA Apply 1 application topically as needed.    [provider]  diazepam (VALIUM) 2 MG tablet Take 1 tablet (2 mg total) by mouth every 12 (twelve) hours as needed for anxiety. Patient not taking: Reported on 03/22/2017 09/16/16   Delfino Lovett, MD  diphenhydrAMINE (BENADRYL) 25 MG tablet Take 25 mg by mouth every 6 (six) hours as needed.    [provider]  guaiFENesin (  ROBITUSSIN) 100 MG/5ML liquid Take 300 mg by mouth every 6 (six) hours as needed for cough.    [provider]  loperamide (IMODIUM A-D) 2 MG tablet Take 2 mg by mouth as needed for diarrhea or loose stools. Do not exceed 8 doses in 24 hours    [provider]  magnesium hydroxide (MILK OF MAGNESIA) 400 MG/5ML suspension Take 30 mLs by mouth daily as needed for mild constipation.    [provider]  senna-docusate (SENOKOT-S) 8.6-50 MG tablet Take 1 tablet by mouth at bedtime as needed for mild  constipation. 02/09/17   Ramonita Lab, MD    Allergies Other  Family History  Problem Relation Age of Onset  . Liver disease Father   . Heart disease Mother   . Kidney disease Neg Hx   . Prostate cancer Neg Hx   . Bladder Cancer Neg Hx     Social History Social History  Substance Use Topics  . Smoking status: Never Smoker  . Smokeless tobacco: Never Used  . Alcohol use No    Review of Systems level V caveat   ____________________________________________   PHYSICAL EXAM:  VITAL SIGNS: ED Triage Vitals  Enc Vitals Group     BP --      Pulse --      Resp --      Temp --      Temp src --      SpO2 03/22/17 1622 93 %     Weight 03/22/17 1625 140 lb (63.5 kg)     Height 03/22/17 1625 6' (1.829 m)     Head Circumference --      Peak Flow --      Pain Score --      Pain Loc --      Pain Edu? --      Excl. in GC? --     Constitutional: patient with eyes closed and nonverbal. Tremor consistent with Parkinson's disease. Patient sitting upright. Good muscle tone. However, is not conversive. Eyes: Conjunctivae are normal.  Head: Atraumatic. Nose: No congestion/rhinnorhea. Mouth/Throat: Mucous membranes are moist.  Neck: No stridor.   Cardiovascular: Normal rate, regular rhythm. Grossly normal heart sounds.  Respiratory: Normal respiratory effort.  No retractions. Lungs CTAB. Gastrointestinal: Soft and nontender. No distention. Musculoskeletal: No lower extremity tenderness nor edema.  No joint effusions. Neurologic: No gross focal neurologic deficits are appreciated.upper extremity tremor. Skin:  Skin is warm, dry and intact. No rash noted.   ____________________________________________   LABS (all labs ordered are listed, but only abnormal results are displayed)  Labs Reviewed  TSH - Abnormal; Notable for the following:       Result Value   TSH 4.875 (*)    All other components within normal limits  CBC WITH DIFFERENTIAL/PLATELET - Abnormal; Notable for  the following:    RBC 4.15 (*)    HCT 38.9 (*)    Platelets 148 (*)    All other components within normal limits  COMPREHENSIVE METABOLIC PANEL - Abnormal; Notable for the following:    BUN 28 (*)    Total Protein 6.3 (*)    Alkaline Phosphatase 37 (*)    GFR calc non Af Amer 57 (*)    All other components within normal limits  TROPONIN I  URINALYSIS, COMPLETE (UACMP) WITH MICROSCOPIC   ____________________________________________  EKG  ED ECG REPORT I, Arelia Longest, the attending physician, personally viewed and interpreted this ECG.   Date: 03/22/2017  EKG Time: 1626  Rate: 62  Rhythm: normal sinus rhythm  Axis: normal  Intervals:right bundle branch block  ST&T Change: no ST segment elevation or depression. No abnormal T-wave inversion. Unchanged from previous.  ____________________________________________  RADIOLOGY   chest x-ray without acute pathology ____________________________________________   PROCEDURES  Procedure(s) performed:   Procedures  Critical Care performed:   ____________________________________________   INITIAL IMPRESSION / ASSESSMENT AND PLAN / ED COURSE  Pertinent labs & imaging results that were available during my care of the patient were reviewed by me and considered in my medical decision making (see chart for details).  ----------------------------------------- 4:53 PM on 03/22/2017 -----------------------------------------  I discussed the case with the patient's daughter who is asking again for the patient to be admitted to the hospital and says that the patient is doing worse than he was yesterday. Patient has a DO NOT RESUSCITATE and the family hasn't talks with hospice. However, hospice care has not been established at this time.    ----------------------------------------- 5:45 PM on 03/22/2017 -----------------------------------------  Patient with baseline lab work as well as imaging. Continues without changes  mental status. He will be admitted to the hospital on fluids. Signed out to Dr. Nemiah Commander. We discussed the case and she will be discussing further with hospice as for next steps.  ____________________________________________   FINAL CLINICAL IMPRESSION(S) / ED DIAGNOSES  failure to thrive. Altered mental status.    NEW MEDICATIONS STARTED DURING THIS VISIT:  New Prescriptions   No medications on file     Note:  This document was prepared using Dragon voice recognition software and may include unintentional dictation errors.     Myrna Blazer, MD 03/22/17 [phone]

## 2017-03-23 DIAGNOSIS — F329 Major depressive disorder, single episode, unspecified: Secondary | ICD-10-CM | POA: Diagnosis not present

## 2017-03-23 DIAGNOSIS — G3189 Other specified degenerative diseases of nervous system: Secondary | ICD-10-CM | POA: Diagnosis not present

## 2017-03-23 DIAGNOSIS — M47812 Spondylosis without myelopathy or radiculopathy, cervical region: Secondary | ICD-10-CM | POA: Diagnosis not present

## 2017-03-23 DIAGNOSIS — R627 Adult failure to thrive: Secondary | ICD-10-CM | POA: Diagnosis present

## 2017-03-23 DIAGNOSIS — F0280 Dementia in other diseases classified elsewhere without behavioral disturbance: Secondary | ICD-10-CM | POA: Diagnosis not present

## 2017-03-23 DIAGNOSIS — Z681 Body mass index (BMI) 19 or less, adult: Secondary | ICD-10-CM | POA: Diagnosis not present

## 2017-03-23 DIAGNOSIS — E039 Hypothyroidism, unspecified: Secondary | ICD-10-CM | POA: Diagnosis not present

## 2017-03-23 DIAGNOSIS — Z66 Do not resuscitate: Secondary | ICD-10-CM | POA: Diagnosis not present

## 2017-03-23 DIAGNOSIS — G309 Alzheimer's disease, unspecified: Secondary | ICD-10-CM | POA: Diagnosis not present

## 2017-03-23 DIAGNOSIS — Z87442 Personal history of urinary calculi: Secondary | ICD-10-CM | POA: Diagnosis not present

## 2017-03-23 DIAGNOSIS — G2581 Restless legs syndrome: Secondary | ICD-10-CM | POA: Diagnosis not present

## 2017-03-23 DIAGNOSIS — Z951 Presence of aortocoronary bypass graft: Secondary | ICD-10-CM | POA: Diagnosis not present

## 2017-03-23 DIAGNOSIS — I700 Atherosclerosis of aorta: Secondary | ICD-10-CM | POA: Diagnosis not present

## 2017-03-23 DIAGNOSIS — I10 Essential (primary) hypertension: Secondary | ICD-10-CM | POA: Diagnosis not present

## 2017-03-23 DIAGNOSIS — I2510 Atherosclerotic heart disease of native coronary artery without angina pectoris: Secondary | ICD-10-CM | POA: Diagnosis not present

## 2017-03-23 DIAGNOSIS — M50322 Other cervical disc degeneration at C5-C6 level: Secondary | ICD-10-CM | POA: Diagnosis not present

## 2017-03-23 DIAGNOSIS — Z79899 Other long term (current) drug therapy: Secondary | ICD-10-CM | POA: Diagnosis not present

## 2017-03-23 DIAGNOSIS — G20 Parkinson's disease: Secondary | ICD-10-CM | POA: Diagnosis not present

## 2017-03-23 DIAGNOSIS — F419 Anxiety disorder, unspecified: Secondary | ICD-10-CM | POA: Diagnosis not present

## 2017-03-23 DIAGNOSIS — Z888 Allergy status to other drugs, medicaments and biological substances status: Secondary | ICD-10-CM | POA: Diagnosis not present

## 2017-03-23 DIAGNOSIS — I6782 Cerebral ischemia: Secondary | ICD-10-CM | POA: Diagnosis not present

## 2017-03-23 LAB — BASIC METABOLIC PANEL
Anion gap: 5 (ref 5–15)
BUN: 26 mg/dL — AB (ref 6–20)
CALCIUM: 8.7 mg/dL — AB (ref 8.9–10.3)
CO2: 28 mmol/L (ref 22–32)
CREATININE: 0.88 mg/dL (ref 0.61–1.24)
Chloride: 109 mmol/L (ref 101–111)
GFR calc Af Amer: >60 mL/min (ref >60)
GFR calc non Af Amer: >60 mL/min (ref >60)
GLUCOSE: 90 mg/dL (ref 65–99)
Potassium: 4.1 mmol/L (ref 3.5–5.1)
Sodium: 142 mmol/L (ref 135–145)

## 2017-03-23 LAB — CBC
HCT: 35.4 % — ABNORMAL LOW (ref 40.0–52.0)
HEMOGLOBIN: 12.5 g/dL — AB (ref 13.0–18.0)
MCH: 32.4 pg (ref 26.0–34.0)
MCHC: 35.3 g/dL (ref 32.0–36.0)
MCV: 91.8 fL (ref 80.0–100.0)
PLATELETS: 126 10*3/uL — AB (ref 150–440)
RBC: 3.86 MIL/uL — ABNORMAL LOW (ref 4.40–5.90)
RDW: 13.4 % (ref 11.5–14.5)
WBC: 5.7 10*3/uL (ref 3.8–10.6)

## 2017-03-23 MED ORDER — LORAZEPAM 2 MG/ML IJ SOLN
1.0000 mg | INTRAMUSCULAR | Status: DC | PRN
  Administered 2017-03-23: 1 mg via INTRAVENOUS
  Filled 2017-03-23: qty 1

## 2017-03-23 NOTE — Discharge Summary (Signed)
[facility] [place] [facility]   PATIENT NAME: Anthony Blankenship    MR#:  [id number]  DATE OF BIRTH:  08-25-1924  DATE OF ADMISSION:  03/22/2017 ADMITTING PHYSICIAN: Enid Baas, MD  DATE OF DISCHARGE: 03/23/17  PRIMARY CARE PHYSICIAN: Royetta Asal, MD    ADMISSION DIAGNOSIS:  Failure to thrive in adult [R62.7] Altered mental status, unspecified altered mental status type [R41.82]  DISCHARGE DIAGNOSIS:  Active Problems:   Failure to thrive (0-17)   SECONDARY DIAGNOSIS:   Past Medical History:  Diagnosis Date  . Anxiety   . Coronary artery disease   . Depression   . Hypertension   . Hypothyroidism   . Kidney stones   . Parkinson's disease (HCC)   . Restless leg syndrome     HOSPITAL COURSE:  HPI  :  Anthony Blankenship  is a 83 y.o. male with a known history of worsening Alzheimer's dementia, CAD, Parkinson's disease, hypertension, anxiety presents from the [facility] facility secondary to failure to thrive. Patient was in the hospital for similar reason last week and was discharged to be followed by hospice services. Hospice was supposed to start tomorrow. Since yesterday, patient's intake has been decreased completely,he is more sleepy and lethargic and difficult to be aroused and so brought back to the emergency room. He had another visit to the ER last night, since his labs were fine and he woke up he was discharged. Same thing happened and is brought back again. Discussed with daughter about worsening disease process and considering hospice facility at discharge. She is agreeable.We'll have care manager consult and hospice consult for this same.    #1 failure to thrive-secondary to progressively worsening dementia and Parkinson's disease. -oral intake remains poor Have discussed with daughter Anthony Blankenship , she is  agreeable with the plan of transfering pt to hospice home  -soft diet as tolerated  #2 Parkinson's  disease-has significant resting tremors. If able to take medications, continue stalevo and primidone  #3 dementia and depression- progressively worsening. Continue home medications  #4 hypothyroidism-on Synthroid  #5 DVT prophylaxis-Lovenox   DISCHARGE CONDITIONS:   fair  CONSULTS OBTAINED:     PROCEDURES  None   DRUG ALLERGIES:   Allergies  Allergen Reactions  . Other Other (See Comments)    Uncoded Allergy. Allergen: Sleeping Medications    DISCHARGE MEDICATIONS:   Current Discharge Medication List    CONTINUE these medications which have NOT CHANGED   Details  carbidopa-levodopa-entacapone (STALEVO) 31.25-125-200 MG tablet Take 1 tablet by mouth See admin instructions. Take one tablet by mouth seven times daily    diclofenac sodium (VOLTAREN) 1 % GEL Apply 2 g topically 3 (three) times daily.     levothyroxine (SYNTHROID, LEVOTHROID) 75 MCG tablet Take 75 mcg by mouth daily.     Melatonin 3 MG TABS Take 1 tablet by mouth at bedtime.    primidone (MYSOLINE) 50 MG tablet Take 50 mg by mouth at bedtime.    sertraline (ZOLOFT) 50 MG tablet Take 1 tablet (50 mg total) by mouth daily.    traZODone (DESYREL) 50 MG tablet Take 25 mg by mouth at bedtime.     acetaminophen (TYLENOL) 325 MG tablet Take 650 mg by mouth every 6 (six) hours as needed for mild pain.     barrier cream (NON-SPECIFIED) CREA Apply 1 application topically as needed.    diazepam (VALIUM) 2 MG tablet Take 1 tablet (2 mg total) by mouth every 12 (twelve)  hours as needed for anxiety. Qty: 10 tablet, Refills: 0    guaiFENesin (ROBITUSSIN) 100 MG/5ML liquid Take 300 mg by mouth every 6 (six) hours as needed for cough.    loperamide (IMODIUM A-D) 2 MG tablet Take 2 mg by mouth as needed for diarrhea or loose stools. Do not exceed 8 doses in 24 hours    magnesium hydroxide (MILK OF MAGNESIA) 400 MG/5ML suspension Take 30 mLs by mouth daily as needed for mild constipation.    senna-docusate  (SENOKOT-S) 8.6-50 MG tablet Take 1 tablet by mouth at bedtime as needed for mild constipation.      STOP taking these medications     diphenhydrAMINE (BENADRYL) 25 MG tablet          DISCHARGE INSTRUCTIONS:   Transfer pt to hospice home when bed is available  DIET:  Soft diet as tolerated   DISCHARGE CONDITION:  Fair  ACTIVITY:  Activity as tolerated  OXYGEN:  Home Oxygen: No.   Oxygen Delivery: room air  DISCHARGE LOCATION:    Transfer pt to hospice home when bed is available  If you experience worsening of your admission symptoms, develop shortness of breath, life threatening emergency, suicidal or homicidal thoughts you must seek medical attention immediately by calling 911 or calling your MD immediately  if symptoms less severe.  You Must read complete instructions/literature along with all the possible adverse reactions/side effects for all the Medicines you take and that have been prescribed to you. Take any new Medicines after you have completely understood and accpet all the possible adverse reactions/side effects.   Please note  You were cared for by a hospitalist during your hospital stay. If you have any questions about your discharge medications or the care you received while you were in the hospital after you are discharged, you can call the unit and asked to speak with the hospitalist on call if the hospitalist that took care of you is not available. Once you are discharged, your primary care physician will handle any further medical issues. Please note that NO REFILLS for any discharge medications will be authorized once you are discharged, as it is imperative that you return to your primary care physician (or establish a relationship with a primary care physician if you do not have one) for your aftercare needs so that they can reassess your need for medications and monitor your lab values.     Today  Chief Complaint  Patient presents with  . Weakness    Pt is very weak with tremors   ROS - unobtainable   VITAL SIGNS:  Blood pressure (!) 145/83, pulse 65, temperature 97.8 F (36.6 C), temperature source Oral, resp. rate 18, height 6' (1.829 m), weight 60.8 kg (134 lb), SpO2 97 %.  I/O:    Intake/Output Summary (Last 24 hours) at 03/23/17 1411 Last data filed at 03/23/17 0950  Gross per 24 hour  Intake          1103.75 ml  Output                0 ml  Net          1103.75 ml    PHYSICAL EXAMINATION:  GENERAL:  83 y.o.-year-old patient lying in the bed with no acute distress.  EYES: Pupils equal, round, reactive to light and accommodation. No scleral icterus. Marland Kitchen  HEENT: Head atraumatic, normocephalic. Oropharynx and nasopharynx clear.  NECK:  Supple, no jugular venous distention. No thyroid enlargement, no tenderness.  LUNGS: Normal breath sounds bilaterally, no wheezing, rales,rhonchi or crepitation. No use of accessory muscles of respiration.  CARDIOVASCULAR: S1, S2 normal. No murmurs, rubs, or gallops.  ABDOMEN: Soft, non-tender, non-distended. Bowel sounds present. No organomegaly or mass.  EXTREMITIES: resting remors  No pedal edema, cyanosis, or clubbing.  NEUROLOGIC: pt is lethargic  PSYCHIATRIC: The patient is lethargic SKIN: No obvious rash, lesion, or ulcer.   DATA REVIEW:   CBC  Recent Labs Lab 03/23/17 0508  WBC 5.7  HGB 12.5*  HCT 35.4*  PLT 126*    Chemistries   Recent Labs Lab 03/22/17 1628 03/23/17 0508  NA 141 142  K 4.5 4.1  CL 107 109  CO2 29 28  GLUCOSE 95 90  BUN 28* 26*  CREATININE 1.10 0.88  CALCIUM 9.3 8.7*  AST 32  --   ALT 35  --   ALKPHOS 37*  --   BILITOT 0.8  --     Cardiac Enzymes  Recent Labs Lab 03/22/17 1628  TROPONINI <0.03    Microbiology Results  Results for orders placed or performed during the hospital encounter of 03/12/17  Blood culture (routine x 2)     Status: None   Collection Time: 03/12/17  8:19 AM  Result Value Ref Range Status   Specimen  Description BLOOD BLOOD RIGHT FOREARM  Final   Special Requests   Final    BOTTLES DRAWN AEROBIC AND ANAEROBIC Blood Culture adequate volume   Culture NO GROWTH 5 DAYS  Final   Report Status 03/17/2017 FINAL  Final  Blood culture (routine x 2)     Status: None   Collection Time: 03/12/17  8:19 AM  Result Value Ref Range Status   Specimen Description BLOOD BLOOD LEFT WRIST  Final   Special Requests   Final    BOTTLES DRAWN AEROBIC AND ANAEROBIC Blood Culture adequate volume   Culture NO GROWTH 5 DAYS  Final   Report Status 03/17/2017 FINAL  Final  MRSA PCR Screening     Status: None   Collection Time: 03/12/17  7:15 PM  Result Value Ref Range Status   MRSA by PCR NEGATIVE NEGATIVE Final    Comment:        The GeneXpert MRSA Assay (FDA approved for NASAL specimens only), is one component of a comprehensive MRSA colonization surveillance program. It is not intended to diagnose MRSA infection nor to guide or monitor treatment for MRSA infections.     RADIOLOGY:  Dg Chest 1 View  Result Date: 03/22/2017 CLINICAL DATA:  Increase weakness today with episodes of vomiting. History of coronary artery disease, Parkinson's disease. EXAM: CHEST 1 VIEW COMPARISON:  Portable chest x-ray of March 21, 2017 FINDINGS: The lungs are adequately inflated and clear. There is mild chronic dilation of the aortic arch. There is calcification within its wall. The heart and pulmonary vascularity are normal. The patient has undergone previous CABG. There is no pleural effusion. The bony thorax exhibits no acute abnormality. IMPRESSION: There is no acute cardiopulmonary abnormality. Previous CABG.  Thoracic aortic atherosclerosis. Electronically Signed   By: David  [place] M.D.   On: 03/22/2017 16:47   Ct Head Wo Contrast  Result Date: 03/22/2017 CLINICAL DATA:  Increased weakness in episodes of vomiting today, fell yesterday, failure to thrive, altered mental status, declining status over many months,  history hypertension, Parkinson's, coronary artery disease EXAM: CT HEAD WITHOUT CONTRAST TECHNIQUE: Contiguous axial images were obtained from the base of the skull through the vertex without intravenous  contrast. COMPARISON:  03/20/2017 FINDINGS: Brain: Generalized atrophy. Normal ventricular morphology. No midline shift or mass effect. Small vessel chronic ischemic changes of deep cerebral white matter. No intracranial hemorrhage, mass lesion, or evidence of acute infarction. No extra-axial fluid collections. Vascular: Atherosclerotic calcification of internal carotid arteries at skullbase Skull: Intact Sinuses/Orbits: Clear Other: N/A IMPRESSION: Atrophy with small vessel chronic ischemic changes of deep cerebral white matter. No acute intracranial abnormalities. No significant interval change. Electronically Signed   By: Ulyses Southward M.D.   On: 03/22/2017 17:03   Ct Head Wo Contrast  Result Date: 03/20/2017 CLINICAL DATA:  Initial evaluation for acute head trauma, ataxia. EXAM: CT HEAD WITHOUT CONTRAST CT CERVICAL SPINE WITHOUT CONTRAST TECHNIQUE: Multidetector CT imaging of the head and cervical spine was performed following the standard protocol without intravenous contrast. Multiplanar CT image reconstructions of the cervical spine were also generated. COMPARISON:  Priors CT from 03/15/2017. FINDINGS: CT HEAD FINDINGS Brain: Generalized age related cerebral atrophy. Mild chronic small vessel ischemic disease. No acute intracranial hemorrhage. No evidence for acute large vessel territory infarct. No mass lesion, midline shift or mass effect. No hydrocephalus. No extra-axial fluid collection. Vascular: No hyperdense vessel. Scattered vascular calcifications noted within the carotid siphons. Skull: Scalp soft tissues and calvarium within normal limits. Sinuses/Orbits: Globes and orbital soft tissues demonstrate no acute abnormality. Paranasal sinuses and mastoid air cells are clear. Other: None. CT CERVICAL  SPINE FINDINGS Alignment: Vertebral bodies normally aligned with preservation of the normal cervical lordosis. Trace anterolisthesis of C7 on T1, stable, and likely due to chronic facet degeneration. Skull base and vertebrae: Skullbase intact. Normal C1-2 articulations are preserved in the dens is intact. Vertebral body heights maintained. The no acute fracture. Soft tissues and spinal canal: Soft tissues of the neck demonstrate no acute abnormality. Vascular calcifications about the carotid bifurcations. No prevertebral edema. Disc levels: Moderate multilevel degenerative spondylolysis, greatest at C5-6 and C6-7. Prominent degenerative changes with calcification noted about the C1-2 articulation. Upper chest: Visualized upper chest demonstrates no acute abnormality. Partially visualized lungs are clear. No apical pneumothorax. Other: None. IMPRESSION: CT BRAIN: 1. No acute intracranial process. 2. Stable atrophy with chronic microvascular ischemic disease. CT CERVICAL SPINE: 1. No acute traumatic injury within the cervical spine. 2. Moderate multilevel degenerative spondylolysis, greatest at C5-6 and C6-7. Electronically Signed   By: Rise Mu M.D.   On: 03/20/2017 21:37   Ct Cervical Spine Wo Contrast  Result Date: 03/20/2017 CLINICAL DATA:  Initial evaluation for acute head trauma, ataxia. EXAM: CT HEAD WITHOUT CONTRAST CT CERVICAL SPINE WITHOUT CONTRAST TECHNIQUE: Multidetector CT imaging of the head and cervical spine was performed following the standard protocol without intravenous contrast. Multiplanar CT image reconstructions of the cervical spine were also generated. COMPARISON:  Priors CT from 03/15/2017. FINDINGS: CT HEAD FINDINGS Brain: Generalized age related cerebral atrophy. Mild chronic small vessel ischemic disease. No acute intracranial hemorrhage. No evidence for acute large vessel territory infarct. No mass lesion, midline shift or mass effect. No hydrocephalus. No extra-axial  fluid collection. Vascular: No hyperdense vessel. Scattered vascular calcifications noted within the carotid siphons. Skull: Scalp soft tissues and calvarium within normal limits. Sinuses/Orbits: Globes and orbital soft tissues demonstrate no acute abnormality. Paranasal sinuses and mastoid air cells are clear. Other: None. CT CERVICAL SPINE FINDINGS Alignment: Vertebral bodies normally aligned with preservation of the normal cervical lordosis. Trace anterolisthesis of C7 on T1, stable, and likely due to chronic facet degeneration. Skull base and vertebrae: Skullbase intact. Normal C1-2 articulations  are preserved in the dens is intact. Vertebral body heights maintained. The no acute fracture. Soft tissues and spinal canal: Soft tissues of the neck demonstrate no acute abnormality. Vascular calcifications about the carotid bifurcations. No prevertebral edema. Disc levels: Moderate multilevel degenerative spondylolysis, greatest at C5-6 and C6-7. Prominent degenerative changes with calcification noted about the C1-2 articulation. Upper chest: Visualized upper chest demonstrates no acute abnormality. Partially visualized lungs are clear. No apical pneumothorax. Other: None. IMPRESSION: CT BRAIN: 1. No acute intracranial process. 2. Stable atrophy with chronic microvascular ischemic disease. CT CERVICAL SPINE: 1. No acute traumatic injury within the cervical spine. 2. Moderate multilevel degenerative spondylolysis, greatest at C5-6 and C6-7. Electronically Signed   By: Rise Mu M.D.   On: 03/20/2017 21:37   Dg Chest Portable 1 View  Result Date: 03/21/2017 CLINICAL DATA:  Weakness EXAM: PORTABLE CHEST 1 VIEW COMPARISON:  03/15/2017 FINDINGS: Cardiac shadow is stable. Postoperative changes are again seen. Patient is rotated to the left. No focal infiltrate or sizable effusion is seen. Small calcified granuloma is noted in the right mid lung. No acute bony abnormality is seen. IMPRESSION: No acute  abnormality noted. Aortic Atherosclerosis (ICD10-170.0) Electronically Signed   By: Alcide Clever M.D.   On: 03/21/2017 13:58    EKG:   Orders placed or performed during the hospital encounter of 03/22/17  . EKG 12-Lead  . EKG 12-Lead      Management plans discussed with the patient, family and they are in agreement.  CODE STATUS:     Code Status Orders        Start     Ordered   03/22/17 2004  Do not attempt resuscitation (DNR)  Continuous    Question Answer Comment  In the event of cardiac or respiratory ARREST Do not call a "code blue"   In the event of cardiac or respiratory ARREST Do not perform Intubation, CPR, defibrillation or ACLS   In the event of cardiac or respiratory ARREST Use medication by any route, position, wound care, and other measures to relive pain and suffering. May use oxygen, suction and manual treatment of airway obstruction as needed for comfort.      03/22/17 2003    Code Status History    Date Active Date Inactive Code Status Order ID Comments User Context   03/12/2017 12:18 PM 03/16/2017  6:56 PM DNR [id number]  Altamese Dilling, MD Inpatient   02/07/2017  2:24 PM 02/09/2017  8:26 PM DNR [id number]  Ihor Austin, MD Inpatient   09/13/2016  6:44 AM 09/16/2016  7:15 PM DNR [id number]  Ihor Austin, MD Inpatient   06/12/2016  2:55 AM 06/14/2016  8:30 PM DNR [id number]  Arnaldo Natal, MD Inpatient   04/25/2016  1:08 AM 04/25/2016  6:35 PM DNR [id number]  Tonye Royalty, DO Inpatient   04/25/2016  1:07 AM 04/25/2016  1:08 AM Full Code [id number]  Hugelmeyer, Jon Gills, DO Inpatient   05/14/2015  7:05 PM 05/15/2015  4:51 PM DNR [id number]  Altamese Dilling, MD ED    Advance Directive Documentation     Most Recent Value  Type of Advance Directive  Out of facility DNR (pink MOST or yellow form)  Pre-existing out of facility DNR order (yellow form or pink MOST form)  Yellow form placed in chart (order not valid for inpatient use)  "MOST" Form  in Place?  -      TOTAL TIME TAKING CARE OF THIS PATIENT: 43 minutes.   Note: This dictation was prepared  with Dragon dictation along with smaller phrase technology. Any transcriptional errors that result from this process are unintentional.   @  on 03/23/2017 at 2:11 PM  Between 7am to 6pm - Pager - [phone]  After 6pm go to [email or web address] - password [email or web address]  [facility]  Office  [phone]  CC: Primary care physician; Royetta Asal, MD

## 2017-03-23 NOTE — Progress Notes (Signed)
Pt's daughter asked CH to visit with pt. Daughter states she is feeling burdened having to do everything for her father, now a hospice pt, by herself. Daughter told CH that her brother who lives in [place] is expected to come tomorrow. Daughter is heartbroken because she will be losing her best friend and favorite dad. She was emotional, shed tears frequently, and states that she is having panic attacks. Daughter talked about the loss of her husband, loss of her mother, and her struggles with poor health. Pt's daughter is afraid she will be losing her father soon. She is in denial and needs time to recover from this. Chaplain validated her feelings for her father, offered words of encouragement, prayer and pastoral presence.   03/23/17 1300  Clinical Encounter Type  Visited With Patient and family together  Visit Type Initial;Follow-up;Spiritual support;Other (Comment)  Referral From Family  Consult/Referral To Chaplain  Spiritual Encounters  Spiritual Needs Prayer;Emotional

## 2017-03-23 NOTE — Progress Notes (Signed)
CH responded to a PG for room 106. CH spoke in detail with Hospice representative concerning the Pt. Pt is able to understand what is being said, and has difficulty speaking. Daughter is very sad and upset. Pt will transfer to [facility] later today. CH provided the ministry of prayer, empathetic listening, and a calming presence. CH is available for follow up.    03/23/17 1100  Clinical Encounter Type  Visited With Patient;Patient and family together;Health care provider  Visit Type Initial;Spiritual support  Referral From Nurse  Consult/Referral To Chaplain  Spiritual Encounters  Spiritual Needs Prayer;Emotional;Grief support

## 2017-03-23 NOTE — Care Management (Signed)
Patient presented from the [facility] and placed in observation for failure to thrive. Patient was to have been opened by [facility] today at [facility].  Agency is aware patient has been placed under observation

## 2017-03-23 NOTE — Progress Notes (Signed)
Report called to the hospice home, EMS called for transport. Patient's daughter [name] left earlier to go home and rest. Hospice home RN to notify her when he arrives. Hospital care team all aware. Thank you. Dayna Barker RN, BSN, [facility] [place], hospital liaison [phone] c

## 2017-03-23 NOTE — Care Management Obs Status (Signed)
MEDICARE OBSERVATION STATUS NOTIFICATION   Patient Details  Name: KHIYAN CRACE MRN: [id number] Date of Birth: July 15, 1924   Medicare Observation Status Notification Given:  Yes Notice signed by patient's daughter, one given to patient / daughter and the other to HIM for scanning   Eber Hong, RN 03/23/2017, 9:13 AM

## 2017-03-23 NOTE — Care Management (Signed)
Spoke with patient's daughter who I sat bedside.  She is pacing the room, wringing her hands and verbalizing that she needs something for her nerves.  She is verbalizing "he is suppose to go to the hospice home today."  Spoke with nurse liasion and this was / is not a definite plan.

## 2017-03-23 NOTE — Progress Notes (Signed)
Visit made. Patient was to be admitted to hospice services at [facility] today, however he was admitted to [facility] last evening for failure to thrive. Patient's daughter [name] has told [facility] Ermalene Searing that the plan was for him to transfer to the [facility]. Writer reviewed H+P as well as Palliative NP notes from her visit on Monday. Patient has had a significant decline in his swallowing in the past few weeks. He has been refusing his medications and did not take oral medications this morning.  Patient seen lying in bed, some throaty secretions noted. Patient appeared to be sleeping, but did answer questions with a  Very soft garbled voice. Daughter [name] at bedside, very, very anxious. Writer attempted to calm her. She voiced her concern over her father "starving". Writer gently discussed the advancement of his disease process and how with Parkinson's this is a natural progression of the disease and the risks of "forcing" food when swallowing is ineffective.  Writer focused on the need for comfort, which [name] stated she wanted for her father. She did ask her father if this is what he wanted and he did answer clearly yes. She also confirmed that he did not "want anything to prolong this". No feeding tube. DNR in place.  Writer made certain [name] understood that IV fluids would not be administered at the Hospice home, that the focus would be on frequent mouth care and that food/fluids would be offered if patient was alert enough and strong enough to swallow efficiently. Discussed the risks of aspiration, again she voiced understanding. [name] remained very anxious, but did again voice her desire for her father to transfer to the hospice home. Much reassurance provided. Hospital care team made aware of discharge plan. Patient will transfer to the hospice home late this afternoon. Referral intake made aware. Writer will continue to follow through final disposition. Updated notes faxed to referral. Hospital care  team all aware of planned transfer. Thank you. Dayna Barker RN, BSN, [facility] [place], [facility] [phone] c

## 2017-03-23 NOTE — Clinical Social Work Note (Addendum)
CSW was informed that patient will be discharging to [facility] today.  Patient to be d/c'ed today to [facility].  Patient and family agreeable to plans will transport via ems hospice nurse liaison RN to call report.  CSW updated Dustin at [employer] to inform him that patient is discharging to hospice home.  Windell Moulding, MSW, [facility] [phone]

## 2017-03-23 NOTE — Plan of Care (Signed)
Problem: Education: Goal: Knowledge of [facility] Education information/materials will improve Outcome: Progressing Pt likes to be called Anthony Blankenship  Past Medical History:  Diagnosis Date  . Anxiety   . Coronary artery disease   . Depression   . Hypertension   . Hypothyroidism   . Kidney stones   . Parkinson's disease (HCC)   . Restless leg syndrome

## 2017-04-27 DEATH — deceased

## 2017-06-01 ENCOUNTER — Ambulatory Visit: Payer: Medicare Other | Admitting: Urology

## 2019-05-06 IMAGING — CT CT HEAD W/O CM
4 series · 16 of 47 positions shown, 18 images · non-contrast
Comparison: 01/24/2017

CLINICAL DATA: Minimally responsive. Altered mental status of
uncertain etiology.

EXAM:
CT HEAD WITHOUT CONTRAST
TECHNIQUE: Contiguous axial images were obtained from the base of the skull
through the vertex without intravenous contrast.

[Series 2: head wo · axial · 0.47mm/px · z∈[-78,+32]mm · 7 of 30 slices shown, 9 images]
[im 4/30  brain]
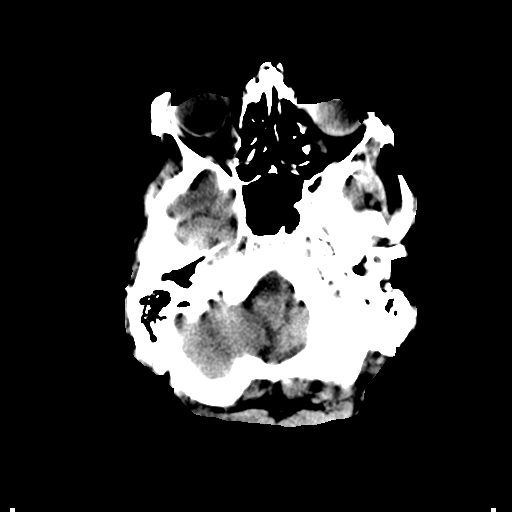
[im 4/30  bone]
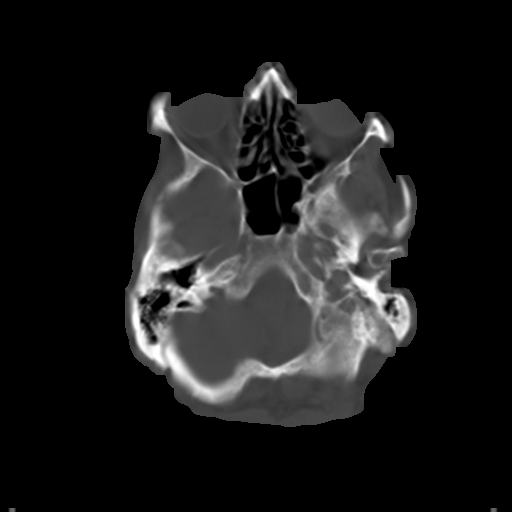
[im 8/30  brain]
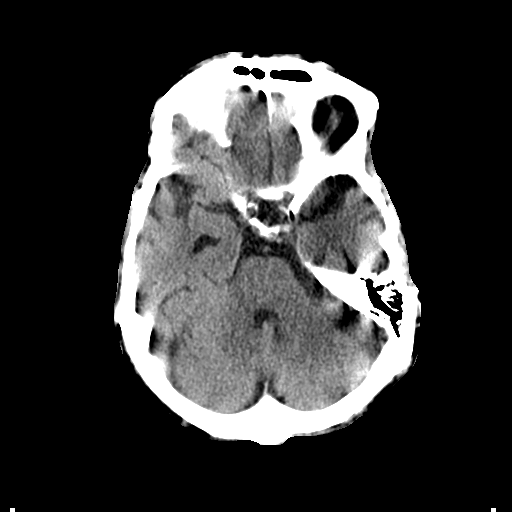
[im 11/30  brain]
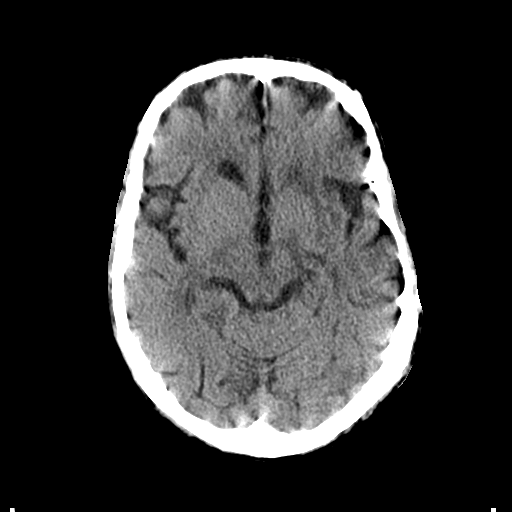
[im 15/30  brain]
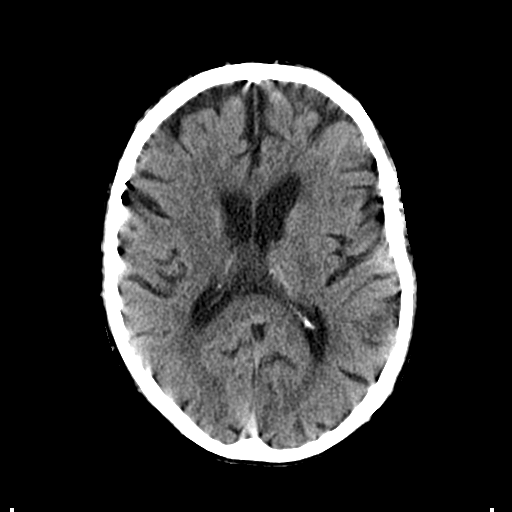
[im 19/30  brain]
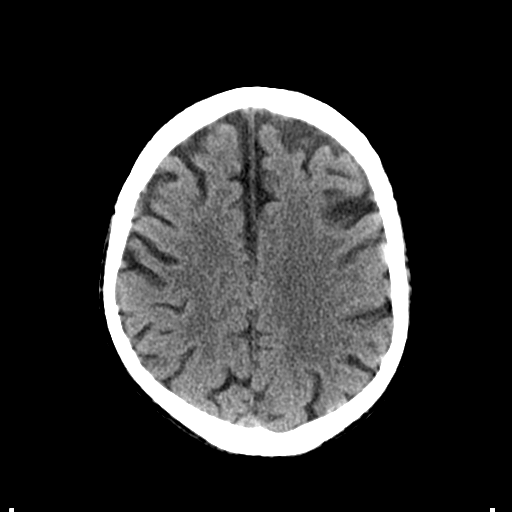
[im 19/30  bone]
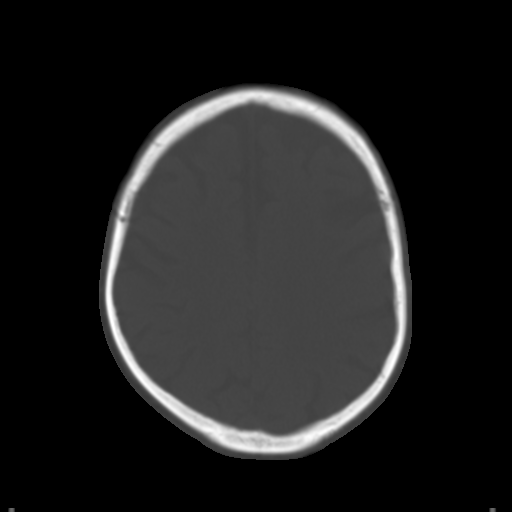
[im 22/30  brain]
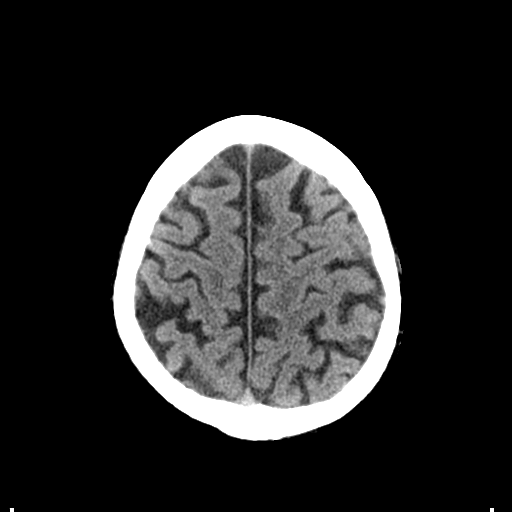
[im 26/30  brain]
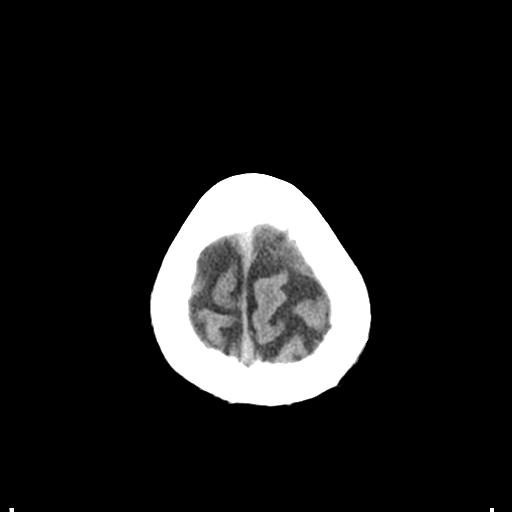

[Series 3: head bone · axial · 0.47mm/px · z∈[-79,-49]mm · 3 of 75 slices shown]
[im 8/75  bone]
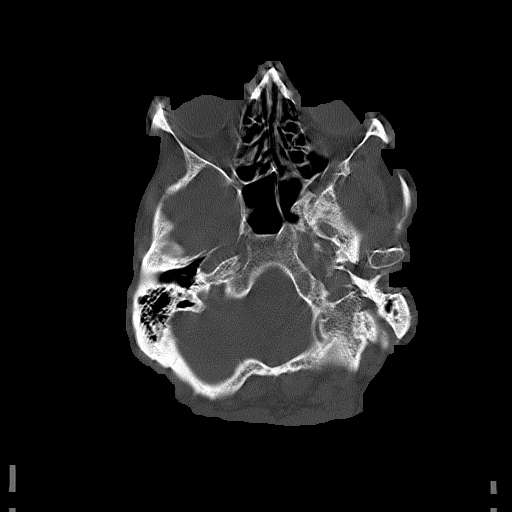
[im 15/75  bone]
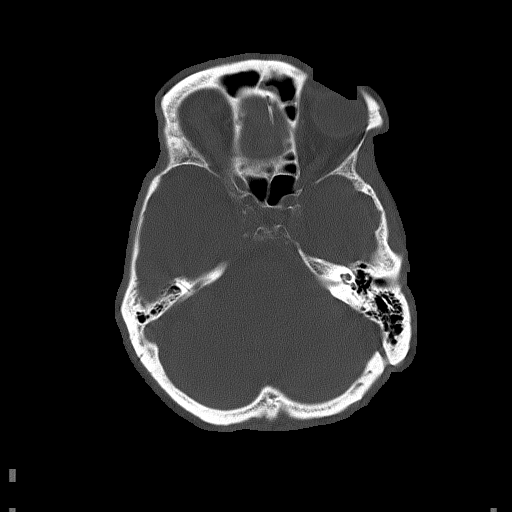
[im 23/75  bone]
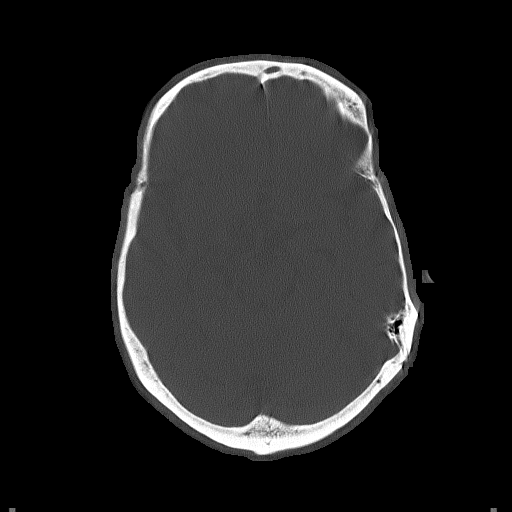

[Series 4: coronal soft tissue · coronal · 0.30mm/px · 3 of 62 slices shown]
[im 21/62  brain]
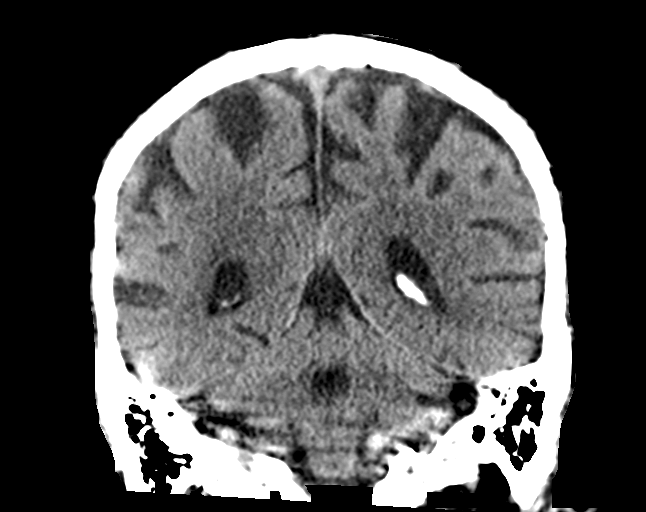
[im 28/62  brain]
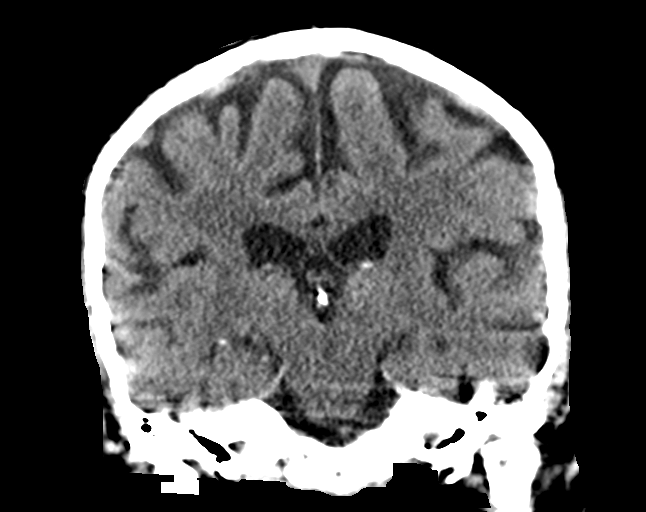
[im 34/62  brain]
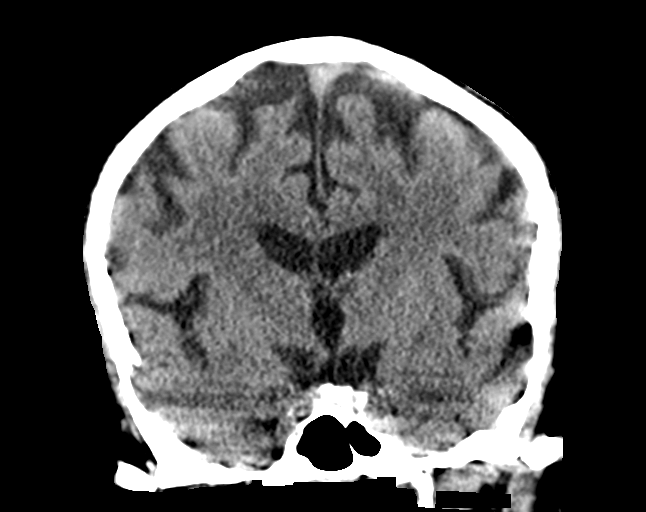

[Series 5: sagittal soft tissue · sagittal · 0.30mm/px · 3 of 50 slices shown]
[im 17/50  brain]
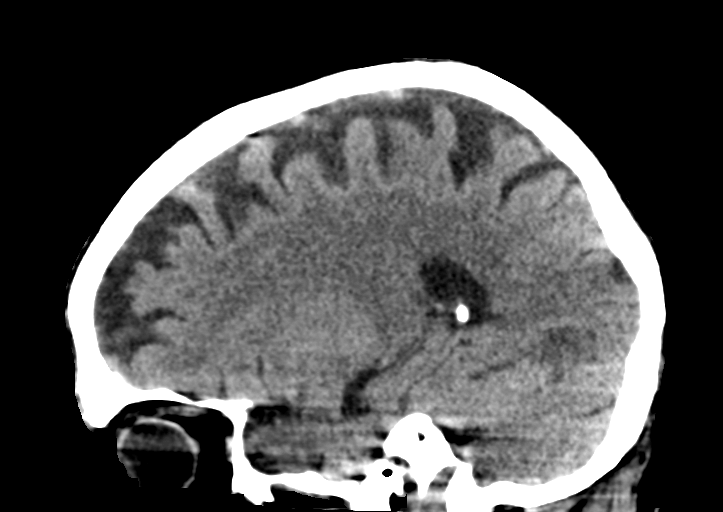
[im 25/50  brain]
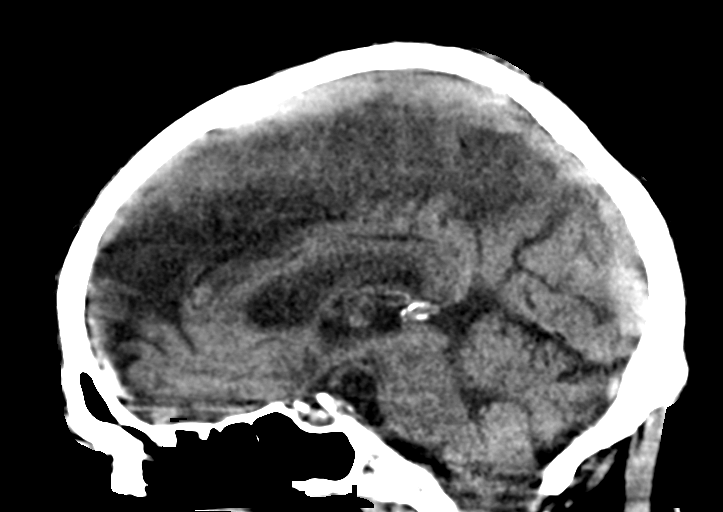
[im 33/50  brain]
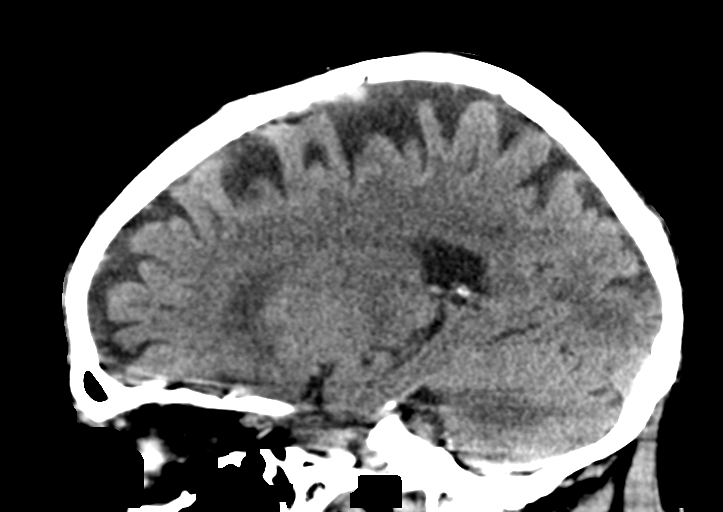

[16 of 47 positions shown; findings below may reference images not displayed]

FINDINGS: Brain: No evidence of acute infarction, hemorrhage, extra-axial
collection, ventriculomegaly, or mass effect. Generalized cerebral
atrophy. Periventricular white matter low attenuation likely
secondary to microangiopathy.

Vascular: Cerebrovascular atherosclerotic calcifications are noted.

Skull: Negative for fracture or focal lesion.

Sinuses/Orbits: Visualized portions of the orbits are unremarkable.
Visualized portions of the paranasal sinuses and mastoid air cells
are unremarkable.

Other: None.
IMPRESSION: 1. No acute intracranial pathology.
2. Chronic microvascular disease and cerebral atrophy.
